# Patient Record
Sex: Male | Born: 1975 | Race: White | Hispanic: No | Marital: Single | Smoking: Current every day smoker
Health system: Southern US, Community
[De-identification: ages and names within clinical notes are randomized; demographics above are authoritative.]

## PROBLEM LIST (undated history)

## (undated) DIAGNOSIS — E119 Type 2 diabetes mellitus without complications: Secondary | ICD-10-CM

## (undated) DIAGNOSIS — K852 Alcohol induced acute pancreatitis without necrosis or infection: Secondary | ICD-10-CM

## (undated) DIAGNOSIS — E118 Type 2 diabetes mellitus with unspecified complications: Secondary | ICD-10-CM

## (undated) DIAGNOSIS — Z9081 Acquired absence of spleen: Secondary | ICD-10-CM

## (undated) HISTORY — PX: PANCREATECTOMY: SHX1019

## (undated) HISTORY — PX: SPLENECTOMY: SUR1306

---

## 2019-07-30 ENCOUNTER — Encounter: Payer: Self-pay | Admitting: *Deleted

## 2019-07-30 ENCOUNTER — Inpatient Hospital Stay
Admission: EM | Admit: 2019-07-30 | Discharge: 2019-08-01 | DRG: 639 | Disposition: A | Discharge status: Home / Self Care | Payer: Medicare Other | Attending: Internal Medicine | Admitting: Internal Medicine

## 2019-07-30 ENCOUNTER — Other Ambulatory Visit: Payer: Self-pay

## 2019-07-30 DIAGNOSIS — E1010 Type 1 diabetes mellitus with ketoacidosis without coma: Secondary | ICD-10-CM | POA: Diagnosis not present

## 2019-07-30 DIAGNOSIS — E1110 Type 2 diabetes mellitus with ketoacidosis without coma: Secondary | ICD-10-CM | POA: Diagnosis present

## 2019-07-30 DIAGNOSIS — Z20822 Contact with and (suspected) exposure to covid-19: Secondary | ICD-10-CM | POA: Diagnosis present

## 2019-07-30 DIAGNOSIS — R109 Unspecified abdominal pain: Secondary | ICD-10-CM

## 2019-07-30 DIAGNOSIS — I10 Essential (primary) hypertension: Secondary | ICD-10-CM | POA: Diagnosis present

## 2019-07-30 DIAGNOSIS — F17200 Nicotine dependence, unspecified, uncomplicated: Secondary | ICD-10-CM | POA: Diagnosis present

## 2019-07-30 LAB — GLUCOSE, CAPILLARY: Glucose-Capillary: 512 mg/dL (ref 70–99)

## 2019-07-30 LAB — CBC
HCT: 41.7 % (ref 39.0–52.0)
Hemoglobin: 13.9 g/dL (ref 13.0–17.0)
MCH: 31.6 pg (ref 26.0–34.0)
MCHC: 33.3 g/dL (ref 30.0–36.0)
MCV: 94.8 fL (ref 80.0–100.0)
Platelets: 254 10*3/uL (ref 150–400)
RBC: 4.4 MIL/uL (ref 4.22–5.81)
RDW: 14.3 % (ref 11.5–15.5)
WBC: 29.3 10*3/uL — ABNORMAL HIGH (ref 4.0–10.5)
nRBC: 0 % (ref 0.0–0.2)

## 2019-07-30 MED ORDER — SODIUM CHLORIDE 0.9% FLUSH: 3.0000 mL | Freq: Once | INTRAVENOUS | Status: DC

## 2019-07-30 NOTE — ED Triage Notes (Addendum)
Pt brought in via EMS from home with high blood sugar.  Pt also has n/v.  Pt reports abd pain  Pt alert   Iv in place.   Ems gave zofran 4mg  and iv NS.

## 2019-07-30 NOTE — ED Notes (Addendum)
fsbs 512 in triage.  Unable to void at this time.

## 2019-07-31 DIAGNOSIS — E1010 Type 1 diabetes mellitus with ketoacidosis without coma: Secondary | ICD-10-CM | POA: Diagnosis present

## 2019-07-31 DIAGNOSIS — Z20822 Contact with and (suspected) exposure to covid-19: Secondary | ICD-10-CM | POA: Diagnosis present

## 2019-07-31 DIAGNOSIS — R109 Unspecified abdominal pain: Secondary | ICD-10-CM

## 2019-07-31 DIAGNOSIS — E1110 Type 2 diabetes mellitus with ketoacidosis without coma: Secondary | ICD-10-CM | POA: Diagnosis present

## 2019-07-31 DIAGNOSIS — F17200 Nicotine dependence, unspecified, uncomplicated: Secondary | ICD-10-CM | POA: Diagnosis present

## 2019-07-31 DIAGNOSIS — E0810 Diabetes mellitus due to underlying condition with ketoacidosis without coma: Secondary | ICD-10-CM | POA: Diagnosis not present

## 2019-07-31 DIAGNOSIS — I10 Essential (primary) hypertension: Secondary | ICD-10-CM | POA: Diagnosis present

## 2019-07-31 LAB — RESPIRATORY PANEL BY RT PCR (FLU A&B, COVID)
Influenza A by PCR: NEGATIVE
Influenza B by PCR: NEGATIVE
SARS Coronavirus 2 by RT PCR: NEGATIVE

## 2019-07-31 LAB — GLUCOSE, CAPILLARY
Glucose-Capillary: 439 mg/dL — ABNORMAL HIGH (ref 70–99)
Glucose-Capillary: 390 mg/dL — ABNORMAL HIGH (ref 70–99)
Glucose-Capillary: 246 mg/dL — ABNORMAL HIGH (ref 70–99)
Glucose-Capillary: 189 mg/dL — ABNORMAL HIGH (ref 70–99)
Glucose-Capillary: 137 mg/dL — ABNORMAL HIGH (ref 70–99)
Glucose-Capillary: 122 mg/dL — ABNORMAL HIGH (ref 70–99)
Glucose-Capillary: 117 mg/dL — ABNORMAL HIGH (ref 70–99)
Glucose-Capillary: 120 mg/dL — ABNORMAL HIGH (ref 70–99)
Glucose-Capillary: 139 mg/dL — ABNORMAL HIGH (ref 70–99)
Glucose-Capillary: 177 mg/dL — ABNORMAL HIGH (ref 70–99)
Glucose-Capillary: 180 mg/dL — ABNORMAL HIGH (ref 70–99)
Glucose-Capillary: 193 mg/dL — ABNORMAL HIGH (ref 70–99)
Glucose-Capillary: 194 mg/dL — ABNORMAL HIGH (ref 70–99)
Glucose-Capillary: 139 mg/dL — ABNORMAL HIGH (ref 70–99)
Glucose-Capillary: 76 mg/dL (ref 70–99)

## 2019-07-31 LAB — BASIC METABOLIC PANEL
Anion gap: 19 — ABNORMAL HIGH (ref 5–15)
Anion gap: 6 (ref 5–15)
Anion gap: 5 (ref 5–15)
Anion gap: 5 (ref 5–15)
Anion gap: 9 (ref 5–15)
BUN: 14 mg/dL (ref 6–20)
BUN: 15 mg/dL (ref 6–20)
BUN: 15 mg/dL (ref 6–20)
BUN: 15 mg/dL (ref 6–20)
BUN: 14 mg/dL (ref 6–20)
CO2: 15 mmol/L — ABNORMAL LOW (ref 22–32)
CO2: 25 mmol/L (ref 22–32)
CO2: 25 mmol/L (ref 22–32)
CO2: 25 mmol/L (ref 22–32)
CO2: 23 mmol/L (ref 22–32)
Calcium: 8.7 mg/dL — ABNORMAL LOW (ref 8.9–10.3)
Calcium: 9.2 mg/dL (ref 8.9–10.3)
Calcium: 8.5 mg/dL — ABNORMAL LOW (ref 8.9–10.3)
Calcium: 8.2 mg/dL — ABNORMAL LOW (ref 8.9–10.3)
Calcium: 8.6 mg/dL — ABNORMAL LOW (ref 8.9–10.3)
Chloride: 105 mmol/L (ref 98–111)
Chloride: 110 mmol/L (ref 98–111)
Chloride: 108 mmol/L (ref 98–111)
Chloride: 105 mmol/L (ref 98–111)
Chloride: 104 mmol/L (ref 98–111)
Creatinine, Ser: 1.25 mg/dL — ABNORMAL HIGH (ref 0.61–1.24)
Creatinine, Ser: 1.11 mg/dL (ref 0.61–1.24)
Creatinine, Ser: 1.02 mg/dL (ref 0.61–1.24)
Creatinine, Ser: 1.02 mg/dL (ref 0.61–1.24)
Creatinine, Ser: 0.83 mg/dL (ref 0.61–1.24)
GFR calc Af Amer: >60 mL/min (ref >60)
GFR calc Af Amer: >60 mL/min (ref >60)
GFR calc Af Amer: >60 mL/min (ref >60)
GFR calc Af Amer: >60 mL/min (ref >60)
GFR calc Af Amer: >60 mL/min (ref >60)
GFR calc non Af Amer: >60 mL/min (ref >60)
GFR calc non Af Amer: >60 mL/min (ref >60)
GFR calc non Af Amer: >60 mL/min (ref >60)
GFR calc non Af Amer: >60 mL/min (ref >60)
GFR calc non Af Amer: >60 mL/min (ref >60)
Glucose, Bld: 469 mg/dL — ABNORMAL HIGH (ref 70–99)
Glucose, Bld: 148 mg/dL — ABNORMAL HIGH (ref 70–99)
Glucose, Bld: 165 mg/dL — ABNORMAL HIGH (ref 70–99)
Glucose, Bld: 186 mg/dL — ABNORMAL HIGH (ref 70–99)
Glucose, Bld: 97 mg/dL (ref 70–99)
Potassium: 4.8 mmol/L (ref 3.5–5.1)
Potassium: 3.8 mmol/L (ref 3.5–5.1)
Potassium: 4.2 mmol/L (ref 3.5–5.1)
Potassium: 4.2 mmol/L (ref 3.5–5.1)
Potassium: 4 mmol/L (ref 3.5–5.1)
Sodium: 139 mmol/L (ref 135–145)
Sodium: 141 mmol/L (ref 135–145)
Sodium: 138 mmol/L (ref 135–145)
Sodium: 135 mmol/L (ref 135–145)
Sodium: 136 mmol/L (ref 135–145)

## 2019-07-31 LAB — COMPREHENSIVE METABOLIC PANEL
ALT: 42 U/L (ref 0–44)
AST: 70 U/L — ABNORMAL HIGH (ref 15–41)
Albumin: 4 g/dL (ref 3.5–5.0)
Alkaline Phosphatase: 189 U/L — ABNORMAL HIGH (ref 38–126)
Anion gap: 21 — ABNORMAL HIGH (ref 5–15)
BUN: 12 mg/dL (ref 6–20)
CO2: 16 mmol/L — ABNORMAL LOW (ref 22–32)
Calcium: 9.3 mg/dL (ref 8.9–10.3)
Chloride: 98 mmol/L (ref 98–111)
Creatinine, Ser: 1.21 mg/dL (ref 0.61–1.24)
GFR calc Af Amer: >60 mL/min (ref >60)
GFR calc non Af Amer: >60 mL/min (ref >60)
Glucose, Bld: 526 mg/dL (ref 70–99)
Potassium: 4 mmol/L (ref 3.5–5.1)
Sodium: 135 mmol/L (ref 135–145)
Total Bilirubin: 1.2 mg/dL (ref 0.3–1.2)
Total Protein: 6.7 g/dL (ref 6.5–8.1)

## 2019-07-31 LAB — PROCALCITONIN: Procalcitonin: 1.74 ng/mL

## 2019-07-31 LAB — BLOOD GAS, VENOUS
Acid-base deficit: 8.3 mmol/L — ABNORMAL HIGH (ref 0.0–2.0)
Bicarbonate: 20.8 mmol/L (ref 20.0–28.0)
O2 Saturation: 26.8 %
Patient temperature: 37
pCO2, Ven: 57 mmHg (ref 44.0–60.0)
pH, Ven: 7.17 — CL (ref 7.250–7.430)
pO2, Ven: <31 mmHg — CL (ref 32.0–45.0)

## 2019-07-31 LAB — MAGNESIUM: Magnesium: 1.6 mg/dL — ABNORMAL LOW (ref 1.7–2.4)

## 2019-07-31 LAB — HIV ANTIBODY (ROUTINE TESTING W REFLEX): HIV Screen 4th Generation wRfx: NONREACTIVE

## 2019-07-31 LAB — MRSA PCR SCREENING: MRSA by PCR: NEGATIVE

## 2019-07-31 LAB — LIPASE, BLOOD: Lipase: 12 U/L (ref 11–51)

## 2019-07-31 LAB — CBC
HCT: 34.8 % — ABNORMAL LOW (ref 39.0–52.0)
Hemoglobin: 12.2 g/dL — ABNORMAL LOW (ref 13.0–17.0)
MCH: 31.7 pg (ref 26.0–34.0)
MCHC: 35.1 g/dL (ref 30.0–36.0)
MCV: 90.4 fL (ref 80.0–100.0)
Platelets: 233 10*3/uL (ref 150–400)
RBC: 3.85 MIL/uL — ABNORMAL LOW (ref 4.22–5.81)
RDW: 14.2 % (ref 11.5–15.5)
WBC: 30.5 10*3/uL — ABNORMAL HIGH (ref 4.0–10.5)
nRBC: 0 % (ref 0.0–0.2)

## 2019-07-31 LAB — BETA-HYDROXYBUTYRIC ACID: Beta-Hydroxybutyric Acid: 0.86 mmol/L — ABNORMAL HIGH (ref 0.05–0.27)

## 2019-07-31 LAB — HEMOGLOBIN A1C
Hgb A1c MFr Bld: 8.9 % — ABNORMAL HIGH (ref 4.8–5.6)
Mean Plasma Glucose: 208.73 mg/dL

## 2019-07-31 LAB — PHOSPHORUS: Phosphorus: 1.7 mg/dL — ABNORMAL LOW (ref 2.5–4.6)

## 2019-07-31 MED ORDER — INSULIN GLARGINE 100 UNIT/ML ~~LOC~~ SOLN
6.0000 [IU] | Freq: Every day | SUBCUTANEOUS | Status: DC
  Administered 2019-07-31: 6 [IU] via SUBCUTANEOUS
  Filled 2019-07-31 (×2): qty 0.06

## 2019-07-31 MED ORDER — INSULIN REGULAR(HUMAN) IN NACL 100-0.9 UT/100ML-% IV SOLN
INTRAVENOUS | Status: DC
  Administered 2019-07-31: 02:00:00 12 [IU]/h via INTRAVENOUS
  Filled 2019-07-31: qty 100

## 2019-07-31 MED ORDER — BUPRENORPHINE HCL-NALOXONE HCL 2-0.5 MG SL SUBL
2.0000 | SUBLINGUAL_TABLET | Freq: Four times a day (QID) | SUBLINGUAL | Status: DC
  Administered 2019-07-31 – 2019-08-01 (×5): 2 via SUBLINGUAL
  Filled 2019-07-31 (×6): qty 2

## 2019-07-31 MED ORDER — CHLORHEXIDINE GLUCONATE CLOTH 2 % EX PADS: 6.0000 | MEDICATED_PAD | Freq: Every day | CUTANEOUS | Status: DC

## 2019-07-31 MED ORDER — ENOXAPARIN SODIUM 40 MG/0.4ML ~~LOC~~ SOLN
40.0000 mg | SUBCUTANEOUS | Status: DC
  Filled 2019-07-31: qty 0.4

## 2019-07-31 MED ORDER — POTASSIUM PHOSPHATES 15 MMOLE/5ML IV SOLN
30.0000 mmol | Freq: Once | INTRAVENOUS | Status: AC
  Administered 2019-07-31: 30 mmol via INTRAVENOUS
  Filled 2019-07-31: qty 10

## 2019-07-31 MED ORDER — DEXTROSE 50 % IV SOLN
0.0000 mL | INTRAVENOUS | Status: DC | PRN
  Filled 2019-07-31: qty 50

## 2019-07-31 MED ORDER — POLYETHYLENE GLYCOL 3350 17 G PO PACK: 17.0000 g | PACK | Freq: Every day | ORAL | Status: DC | PRN

## 2019-07-31 MED ORDER — POTASSIUM CHLORIDE 10 MEQ/100ML IV SOLN
10.0000 meq | INTRAVENOUS | Status: AC
  Administered 2019-07-31: 10 meq via INTRAVENOUS
  Filled 2019-07-31: qty 100

## 2019-07-31 MED ORDER — ENOXAPARIN SODIUM 40 MG/0.4ML ~~LOC~~ SOLN
40.0000 mg | SUBCUTANEOUS | Status: DC
  Administered 2019-07-31: 40 mg via SUBCUTANEOUS
  Filled 2019-07-31: qty 0.4

## 2019-07-31 MED ORDER — DOCUSATE SODIUM 100 MG PO CAPS: 100.0000 mg | ORAL_CAPSULE | Freq: Two times a day (BID) | ORAL | Status: DC | PRN

## 2019-07-31 MED ORDER — SODIUM CHLORIDE 0.9 % IV BOLUS
1000.0000 mL | Freq: Once | INTRAVENOUS | Status: AC
  Administered 2019-07-31: 1000 mL via INTRAVENOUS

## 2019-07-31 MED ORDER — INSULIN ASPART 100 UNIT/ML ~~LOC~~ SOLN
0.0000 [IU] | SUBCUTANEOUS | Status: DC
  Administered 2019-07-31 – 2019-08-01 (×2): 1 [IU] via SUBCUTANEOUS
  Filled 2019-07-31 (×2): qty 1

## 2019-07-31 MED ORDER — INSULIN ASPART 100 UNIT/ML ~~LOC~~ SOLN: 0.0000 [IU] | SUBCUTANEOUS | Status: DC

## 2019-07-31 MED ORDER — BUPRENORPHINE HCL-NALOXONE HCL 8-2 MG SL SUBL
1.0000 | SUBLINGUAL_TABLET | Freq: Two times a day (BID) | SUBLINGUAL | Status: DC
  Administered 2019-07-31: 1 via SUBLINGUAL
  Filled 2019-07-31: qty 1

## 2019-07-31 MED ORDER — ONDANSETRON HCL 4 MG/2ML IJ SOLN: 4.0000 mg | Freq: Four times a day (QID) | INTRAMUSCULAR | Status: DC | PRN

## 2019-07-31 MED ORDER — INSULIN ASPART 100 UNIT/ML ~~LOC~~ SOLN: 3.0000 [IU] | Freq: Three times a day (TID) | SUBCUTANEOUS | Status: DC

## 2019-07-31 MED ORDER — PROMETHAZINE HCL 25 MG/ML IJ SOLN
6.2500 mg | Freq: Four times a day (QID) | INTRAMUSCULAR | Status: DC | PRN
  Administered 2019-07-31: 6.25 mg via INTRAVENOUS
  Filled 2019-07-31: qty 1

## 2019-07-31 MED ORDER — MORPHINE SULFATE (PF) 2 MG/ML IV SOLN: 1.0000 mg | INTRAVENOUS | Status: DC | PRN

## 2019-07-31 MED ORDER — DEXTROSE-NACL 5-0.45 % IV SOLN: INTRAVENOUS | Status: DC

## 2019-07-31 MED ORDER — SENNOSIDES-DOCUSATE SODIUM 8.6-50 MG PO TABS
1.0000 | ORAL_TABLET | Freq: Two times a day (BID) | ORAL | Status: DC
  Filled 2019-07-31: qty 1

## 2019-07-31 MED ORDER — ONDANSETRON HCL 4 MG/2ML IJ SOLN
4.0000 mg | Freq: Once | INTRAMUSCULAR | Status: AC
  Administered 2019-07-31: 4 mg via INTRAVENOUS
  Filled 2019-07-31: qty 2

## 2019-07-31 MED ORDER — SODIUM CHLORIDE 0.9 % IV SOLN: INTRAVENOUS | Status: DC

## 2019-07-31 NOTE — Progress Notes (Signed)
Inpatient Diabetes Program Recommendations  AACE/ADA: New Consensus Statement on Inpatient Glycemic Control   Target Ranges:  Prepandial:   less than 140 mg/dL      Peak postprandial:   less than 180 mg/dL (1-2 hours)      Critically ill patients:  140 - 180 mg/dL   Results for Ian Gross, Ian "CHRIS" (MRN [id number]) as of 07/31/2019 07:53  Ref. Range 07/31/2019 01:38 07/31/2019 02:48 07/31/2019 03:51 07/31/2019 04:55 07/31/2019 05:58 07/31/2019 06:59  Glucose-Capillary Latest Ref Range: 70 - 99 mg/dL 409 (H) 811 (H) 914 (H) 189 (H) 137 (H) 122 (H)  Results for Ian Gross, Ian "CHRIS" (MRN [id number]) as of 07/31/2019 07:53  Ref. Range 07/30/2019 22:40 07/31/2019 00:17  Beta-Hydroxybutyric Acid Latest Ref Range: 0.05 - 0.27 mmol/L  0.86 (H)  Glucose Latest Ref Range: 70 - 99 mg/dL 086 (HH)    Review of Glycemic Control  Diabetes history: Treated as DM1 (had pancreatectomy Feb 2021) Outpatient Diabetes medications: Lantus 9 units QHS, Humalog 5 units with small meals or 10 units with regular or large meals, Humalog 0-10 units TID with meals for correction Current orders for Inpatient glycemic control: IV insulin  Inpatient Diabetes Program Recommendations:   Insulin at Transition from IV to SQ: Once MD is ready to transition from IV to SQ insulin, please consider ordering Lantus 6 units Q24H (based on 59 kg x 0.1 units), Novolog 0-9 units Q4H, and Novolog 3 units TID with meals if patient eats at least 50% of meals.  NOTE: In reviewing chart, noted patient was dx with pancreatic insufficiency 10/2018 and had pancreatectomy Feb 2021. Patient is followed by [facility] Endocr[facility] and treated as having Type 1 DM. Patient seen Dr. Yetta Barre [facility] Endocrinology) on 07/04/19 and per office note in the chart, patient was asked to increase Lantus to 9 units QHS, increase meal coverage to Humalog 5 units with small meals or 10 units with regular or large meals, and correction insulin was increased (if 151-200 take 2 units;  201-250 take 4 units; 251-300 take 6 units; 301-350 take 8 units; if over 350 take 10 units). Also noted A1C was 7.6% on 07/04/19 indicating an average glucose of 171 mg/dl. Spoke with patient over the phone to inquire about insulin regimen and glucose control. Patient confirms that he is taking Lantus and Humalog as noted above. Patient states that he is also using the FreeStyle Libre glucose monitoring sensor. Since patient last seen [facility] Endocrinology and insulin was increased, he is experiencing frequent hypoglycemia (6-7 times per week) and frequently wakes up during the night with hypoglycemia. Patient reports that he is able to feel when he is experiencing hypoglycemia. Patient reports that on his prior insulin dosages (before the increase on 07/04/19) he was not experiencing hypoglycemia very often at all. Discussed DKA and hospital protocol with IV insulin and transition to SQ insulin. Discussed SQ insulin recommendations and explained that less Lantus and Novolog meal coverage would be requested than he is taking at home due to his report of frequent hypoglycemia. Patient agreeable with recommended doses of Lantus and Novolog; explained it would be up to the attending doctor to order if they are agreeable. Patient verbalized understanding of information discussed and he states that he has no questions at this time.  Thanks, Orlando Penner, RN, MSN, CDE Diabetes Coordinator Inpatient Diabetes Program [phone] (Team Pager from 8am to 5pm)

## 2019-07-31 NOTE — ED Provider Notes (Signed)
[facility] Emergency Department Provider Note  ____________________________________________   First MD Initiated Contact with Patient 07/31/19 0002     (approximate)  I have reviewed the triage vital signs and the nursing notes.   HISTORY  Chief Complaint Nausea and Emesis    HPI Ian Gross is a 44 y.o. male with history of type 2 diabetes presents to the emergency department secondary to elevated blood sugar at home patient states that his blood glucose was in the 300s which is not unusual for him.  Patient does admits to nausea and nonbloody emesis.  Patient denies any abdominal pain to me.  Patient denies any recent illness no diarrhea.  Patient denies any urinary symptoms.  Patient denies any cough or dyspnea.        No past medical history on file.  Patient Active Problem List   Diagnosis Date Noted  . DKA (diabetic ketoacidoses) ([place]) 07/31/2019     Prior to Admission medications   Not on File    Allergies Patient has no known allergies.  No family history on file.  Social History Social History   Tobacco Use  . Smoking status: Current Every Day Smoker  . Smokeless tobacco: Never Used  Substance Use Topics  . Alcohol use: Not Currently  . Drug use: Not Currently    Review of Systems Constitutional: No fever/chills Eyes: No visual changes. ENT: No sore throat. Cardiovascular: Denies chest pain. Respiratory: Denies shortness of breath. Gastrointestinal: No abdominal pain.  No nausea, no vomiting.  No diarrhea.  No constipation. Genitourinary: Negative for dysuria. Musculoskeletal: Negative for neck pain.  Negative for back pain. Integumentary: Negative for rash. Neurological: Negative for headaches, focal weakness or numbness. Endocrine:  Positive for hyperglycemia  ____________________________________________   PHYSICAL EXAM:  VITAL SIGNS: ED Triage Vitals [07/30/19 2238]  Enc Vitals Group     BP 112/64      Pulse Rate 91     Resp 20     Temp 97.6 F (36.4 C)     Temp Source Oral     SpO2 99 %     Weight 59 kg (130 lb)     Height 1.829 m (6')     Head Circumference      Peak Flow      Pain Score 0     Pain Loc      Pain Edu?      Excl. in [place]?     Constitutional: Alert and oriented.  Eyes: Conjunctivae are normal.  Mouth/Throat: Dry oral mucosa Neck: No stridor.  No meningeal signs.   Cardiovascular: Normal rate, regular rhythm. Good peripheral circulation. Grossly normal heart sounds. Respiratory: Normal respiratory effort.  No retractions. Gastrointestinal: Soft and nontender. No distention.  Musculoskeletal: No lower extremity tenderness nor edema. No gross deformities of extremities. Neurologic:  Normal speech and language. No gross focal neurologic deficits are appreciated.  Skin:  Skin is warm, dry and intact. Psychiatric: Mood and affect are normal. Speech and behavior are normal.  ____________________________________________   LABS (all labs ordered are listed, but only abnormal results are displayed)  Labs Reviewed  COMPREHENSIVE METABOLIC PANEL - Abnormal; Notable for the following components:      Result Value   CO2 16 (*)    Glucose, Bld 526 (*)    AST 70 (*)    Alkaline Phosphatase 189 (*)    Anion gap 21 (*)    All other components within normal limits  CBC - Abnormal; Notable  for the following components:   WBC 29.3 (*)    All other components within normal limits  GLUCOSE, CAPILLARY - Abnormal; Notable for the following components:   Glucose-Capillary 512 (*)    All other components within normal limits  BLOOD GAS, VENOUS - Abnormal; Notable for the following components:   pH, Ven 7.17 (*)    pO2, Ven <31.0 (*)    Acid-base deficit 8.3 (*)    All other components within normal limits  BETA-HYDROXYBUTYRIC ACID - Abnormal; Notable for the following components:   Beta-Hydroxybutyric Acid 0.86 (*)    All other components within normal limits  LIPASE,  BLOOD  URINALYSIS, COMPLETE (UACMP) WITH MICROSCOPIC  HIV ANTIBODY (ROUTINE TESTING W REFLEX)  CBC  CREATININE, SERUM  CBC  MAGNESIUM  PHOSPHORUS  BASIC METABOLIC PANEL  BASIC METABOLIC PANEL  BASIC METABOLIC PANEL  BASIC METABOLIC PANEL  BASIC METABOLIC PANEL  BASIC METABOLIC PANEL  CBG MONITORING, ED  POC SARS CORONAVIRUS 2 AG -  ED       PROCEDURES   .Critical Care Performed by: Darci Current, MD Authorized by: Darci Current, MD   Critical care provider statement:    Critical care time (minutes):  30   Critical care time was exclusive of:  Separately billable procedures and treating other patients   Critical care was necessary to treat or prevent imminent or life-threatening deterioration of the following conditions:  Endocrine crisis   Critical care was time spent personally by me on the following activities:  Development of treatment plan with patient or surrogate, discussions with consultants, evaluation of patient's response to treatment, examination of patient, obtaining history from patient or surrogate, ordering and performing treatments and interventions, ordering and review of laboratory studies, ordering and review of radiographic studies, pulse oximetry, re-evaluation of patient's condition and review of old charts     ____________________________________________   INITIAL IMPRESSION / MDM / ASSESSMENT AND PLAN / ED COURSE  As part of my medical decision making, I reviewed the following data within the electronic MEDICAL RECORD NUMBER   44 year old male presented with above-stated history review of system consistent with diabetic ketoacidosis which was confirmed on laboratory data.  Patient's glucose 526 with anion gap of 21.  Patient received 2 L IV normal saline and subsequently insulin infusion initiated.  Patient discussed with Annabelle Harman NP in the ICU who will admit the patient for further evaluation and  management. ____________________________________________  FINAL CLINICAL IMPRESSION(S) / ED DIAGNOSES  Final diagnoses:  Diabetic ketoacidosis without coma associated with type 2 diabetes mellitus (HCC)     MEDICATIONS GIVEN DURING THIS VISIT:  Medications  insulin regular, human (MYXREDLIN) 100 units/ 100 mL infusion (has no administration in time range)  0.9 %  sodium chloride infusion (has no administration in time range)  dextrose 5 %-0.45 % sodium chloride infusion (has no administration in time range)  dextrose 50 % solution 0-50 mL (has no administration in time range)  potassium chloride 10 mEq in 100 mL IVPB (has no administration in time range)  docusate sodium (COLACE) capsule 100 mg (has no administration in time range)  polyethylene glycol (MIRALAX / GLYCOLAX) packet 17 g (has no administration in time range)  enoxaparin (LOVENOX) injection 40 mg (has no administration in time range)  sodium chloride 0.9 % bolus 1,000 mL (1,000 mLs Intravenous New Bag/Given 07/31/19 0021)  sodium chloride 0.9 % bolus 1,000 mL (1,000 mLs Intravenous New Bag/Given 07/31/19 0021)  ondansetron (ZOFRAN) injection 4 mg (4 mg  Intravenous Given 07/31/19 0032)     ED Discharge Orders    None      *Please note:  Ian Gross was evaluated in Emergency Department on 07/31/2019 for the symptoms described in the history of present illness. He was evaluated in the context of the global COVID-19 pandemic, which necessitated consideration that the patient might be at risk for infection with the SARS-CoV-2 virus that causes COVID-19. Institutional protocols and algorithms that pertain to the evaluation of patients at risk for COVID-19 are in a state of rapid change based on information released by regulatory bodies including the CDC and federal and state organizations. These policies and algorithms were followed during the patient's care in the ED.  Some ED evaluations and interventions may be delayed as a  result of limited staffing during the pandemic.*  Note:  This document was prepared using Dragon voice recognition software and may include unintentional dictation errors.   Darci Current, MD 07/31/19 [phone]

## 2019-07-31 NOTE — Progress Notes (Signed)
Pharmacy Electrolyte Monitoring Consult:  Pharmacy consulted to assist in monitoring and replacing electrolytes in this 44 y.o. male admitted on 07/30/2019 with DKA.   Labs:  Sodium (mmol/L)  Date Value  07/31/2019 138   Potassium (mmol/L)  Date Value  07/31/2019 4.2   Magnesium (mg/dL)  Date Value  34/14/4360 1.6 (L)   Phosphorus (mg/dL)  Date Value  16/58/0063 1.7 (L)   Calcium (mg/dL)  Date Value  49/49/4473 8.5 (L)   Albumin (g/dL)  Date Value  95/84/4171 4.0    Assessment/Plan: Will order potassium phosphate 30 mmol IV x 1.   Will replace for goal potassium ~ 4 while on insulin infusion and all other electrolytes within normal limits.   Next BMP scheduled for 1400.   Pharmacy will continue to monitor an adjust per consult.   Christl Fessenden L 07/31/2019 11:35 AM

## 2019-07-31 NOTE — H&P (Signed)
Name: Ian Gross MRN: [id number] DOB: 1976/02/08    ADMISSION DATE:  07/30/2019 CONSULTATION DATE: 07/31/2019  REFERRING MD : Dr. 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  He presented to [facility] ER on 05/3 from home via EMS with c/o elevated blood sugars in the 300's, abdominal pain, and d/n/v.  He states he did not take his morning or afternoon insulin due to CBG readings in the 60's.  In the ER lab results ruled pt in for DKA, therefore pt received 2L NS bolus and insulin gtt initiated.  PCCM team contacted by ER physician for hospital admission.    PAST MEDICAL HISTORY :   has no past medical history on file.  has no past surgical history on file. Prior to Admission medications   Not on File   No Known Allergies  FAMILY HISTORY:  family history is not on file. SOCIAL HISTORY:  reports that he has been smoking. He has never used smokeless tobacco. He reports previous alcohol use. He reports previous drug use.  REVIEW OF SYSTEMS: Positives in BOLD  Constitutional: Negative for fever, chills, weight loss, malaise/fatigue and diaphoresis.  HENT: Negative for hearing loss, ear pain, nosebleeds, congestion, sore throat, neck pain, tinnitus and ear discharge.   Eyes: Negative for blurred vision, double vision, photophobia, pain, discharge and redness.  Respiratory: Negative for cough, hemoptysis, sputum production, shortness of breath, wheezing and stridor.   Cardiovascular: Negative for chest pain,  palpitations, orthopnea, claudication, leg swelling and PND.  Gastrointestinal: heartburn, nausea, vomiting, abdominal pain, diarrhea, constipation, blood in stool and melena.  Genitourinary: Negative for dysuria, urgency, frequency, hematuria and flank pain.  Musculoskeletal: Negative for myalgias, back pain, joint pain and falls.  Skin: Negative for itching and rash.  Neurological: Negative for dizziness, tingling, tremors, sensory change, speech change, focal weakness, seizures, loss of consciousness, weakness and headaches.  Endo/Heme/Allergies: Negative for environmental allergies and polydipsia. Does not bruise/bleed easily.  SUBJECTIVE:  c/o abdominal pain  VITAL SIGNS: Temp:  [97.6 F (36.4 C)] 97.6 F (36.4 C) (05/03 2238) Pulse Rate:  [91-119] 119 (05/04 0100) Resp:  [12-20] 13 (05/04 0100) BP: (91-120)/(49-74) 91/74 (05/04 0100) SpO2:  [97 %-100 %] 97 % (05/04 0100) Weight:  [59 kg] 59 kg (05/03 2238)  PHYSICAL EXAMINATION: General: well developed, well nourished male resting in bed, NAD  Neuro: alert and oriented, follows commands  HEENT: poor dentition, no JVD  Cardiovascular: sinus tachycardia, no R/G  Lungs: clear throughout, even, non labored  Abdomen: +BS x4, obese, non distended, tenderness present  Musculoskeletal: normal bulk and tone, no edema  Skin: intact no rashes or lesions present   Recent Labs  Lab 07/30/19 2240  NA 135  K 4.0  CL 98  CO2 16*  BUN 12  CREATININE 1.21  GLUCOSE 526*   Recent Labs  Lab 07/30/19 2240  HGB 13.9  HCT 41.7  WBC 29.3*  PLT 254   No results found.  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

## 2019-07-31 NOTE — Progress Notes (Signed)
VSS. Insulin drip off, transitioned to SSI and meal coverage insulin per diabetic coordinator and [facility]. No output this shift. Bladder scan showed 495 ml. Pt denies pain or discomfort. Dr. Belia Heman made aware. Order for in and out catheter. Pt declines in and out cath at this time and wishes to void in his own time. Will continue to monitor.

## 2019-08-01 LAB — CBC WITH DIFFERENTIAL/PLATELET
Abs Immature Granulocytes: 0.07 10*3/uL (ref 0.00–0.07)
Basophils Absolute: 0.1 10*3/uL (ref 0.0–0.1)
Basophils Relative: 1 %
Eosinophils Absolute: 0.1 10*3/uL (ref 0.0–0.5)
Eosinophils Relative: 1 %
HCT: 34.2 % — ABNORMAL LOW (ref 39.0–52.0)
Hemoglobin: 11.9 g/dL — ABNORMAL LOW (ref 13.0–17.0)
Immature Granulocytes: 0 %
Lymphocytes Relative: 24 %
Lymphs Abs: 3.9 10*3/uL (ref 0.7–4.0)
MCH: 31.4 pg (ref 26.0–34.0)
MCHC: 34.8 g/dL (ref 30.0–36.0)
MCV: 90.2 fL (ref 80.0–100.0)
Monocytes Absolute: 1.5 10*3/uL — ABNORMAL HIGH (ref 0.1–1.0)
Monocytes Relative: 9 %
Neutro Abs: 10.8 10*3/uL — ABNORMAL HIGH (ref 1.7–7.7)
Neutrophils Relative %: 65 %
Platelets: 219 10*3/uL (ref 150–400)
RBC: 3.79 MIL/uL — ABNORMAL LOW (ref 4.22–5.81)
RDW: 14.3 % (ref 11.5–15.5)
WBC: 16.4 10*3/uL — ABNORMAL HIGH (ref 4.0–10.5)
nRBC: 0 % (ref 0.0–0.2)

## 2019-08-01 LAB — BASIC METABOLIC PANEL
Anion gap: 4 — ABNORMAL LOW (ref 5–15)
BUN: 16 mg/dL (ref 6–20)
CO2: 27 mmol/L (ref 22–32)
Calcium: 8.6 mg/dL — ABNORMAL LOW (ref 8.9–10.3)
Chloride: 103 mmol/L (ref 98–111)
Creatinine, Ser: 0.76 mg/dL (ref 0.61–1.24)
GFR calc Af Amer: >60 mL/min (ref >60)
GFR calc non Af Amer: >60 mL/min (ref >60)
Glucose, Bld: 129 mg/dL — ABNORMAL HIGH (ref 70–99)
Potassium: 4.1 mmol/L (ref 3.5–5.1)
Sodium: 134 mmol/L — ABNORMAL LOW (ref 135–145)

## 2019-08-01 LAB — PROCALCITONIN: Procalcitonin: 1.22 ng/mL

## 2019-08-01 LAB — GLUCOSE, CAPILLARY
Glucose-Capillary: 103 mg/dL — ABNORMAL HIGH (ref 70–99)
Glucose-Capillary: 116 mg/dL — ABNORMAL HIGH (ref 70–99)
Glucose-Capillary: 122 mg/dL — ABNORMAL HIGH (ref 70–99)
Glucose-Capillary: 124 mg/dL — ABNORMAL HIGH (ref 70–99)

## 2019-08-01 LAB — MAGNESIUM: Magnesium: 1.5 mg/dL — ABNORMAL LOW (ref 1.7–2.4)

## 2019-08-01 LAB — PHOSPHORUS: Phosphorus: 2.8 mg/dL (ref 2.5–4.6)

## 2019-08-01 MED ORDER — MAGNESIUM SULFATE 4 GM/100ML IV SOLN
4.0000 g | Freq: Once | INTRAVENOUS | Status: AC
  Administered 2019-08-01: 11:00:00 4 g via INTRAVENOUS
  Filled 2019-08-01: qty 100

## 2019-08-01 MED ORDER — PIPERACILLIN-TAZOBACTAM 3.375 G IVPB
3.3750 g | Freq: Three times a day (TID) | INTRAVENOUS | Status: DC
  Filled 2019-08-01 (×3): qty 50

## 2019-08-01 MED ORDER — CIPROFLOXACIN HCL 500 MG PO TABS
500.0000 mg | ORAL_TABLET | Freq: Two times a day (BID) | ORAL | 0 refills | Status: AC
Start: 2019-08-01 — End: 2019-08-08

## 2019-08-01 MED ORDER — MAGNESIUM OXIDE 400 (241.3 MG) MG PO TABS
400.0000 mg | ORAL_TABLET | Freq: Once | ORAL | Status: AC
  Administered 2019-08-01: 400 mg via ORAL
  Filled 2019-08-01: qty 1

## 2019-08-01 MED ORDER — STERILE WATER FOR INJECTION IJ SOLN
INTRAMUSCULAR | Status: AC
  Filled 2019-08-01: qty 10

## 2019-08-01 MED ORDER — VECURONIUM BROMIDE 10 MG IV SOLR
INTRAVENOUS | Status: AC
  Filled 2019-08-01: qty 10

## 2019-08-01 MED ORDER — METRONIDAZOLE 500 MG PO TABS
500.0000 mg | ORAL_TABLET | Freq: Three times a day (TID) | ORAL | 0 refills | Status: AC
Start: 2019-08-01 — End: 2019-08-08

## 2019-08-01 NOTE — Progress Notes (Signed)
A&OX4. RN explained discharge instructions to pt and pt verbalized understanding. VSS. IVs removed. Wheeled to medical mall entrance to private family vehicle.

## 2019-08-01 NOTE — Consult Note (Signed)
Pharmacy Antibiotic Note  Ian Gross is a 44 y.o. male admitted on 07/30/2019 with intra-abdominal infection.  Pharmacy has been consulted for Zosyn dosing.  Plan: Zosyn 3.375g IV q8h (4 hour infusion).  Height: 6' (182.9 cm) Weight: 59 kg (130 lb) IBW/kg (Calculated) : 77.6  Temp (24hrs), Avg:98.6 F (37 C), Min:98.4 F (36.9 C), Max:98.9 F (37.2 C)  Recent Labs  Lab 07/30/19 2240 07/31/19 0300 07/31/19 0635 07/31/19 1041 07/31/19 1428 07/31/19 1850 08/01/19 0509  WBC 29.3*  --  30.5*  --   --   --  16.4*  CREATININE 1.21   < > 1.11 1.02 1.02 0.83 0.76   < > = values in this interval not displayed.    Estimated Creatinine Clearance: 96.3 mL/min (by C-G formula based on SCr of 0.76 mg/dL).    No Known Allergies  Antimicrobials this admission: Zosyn 5/5 >>   Microbiology results: 5/5 MRSA PCR: negative  Thank you for allowing pharmacy to be a part of this patient's care.  Ian Gross A Ian Gross 08/01/2019 9:26 AM

## 2019-08-01 NOTE — Discharge Summary (Signed)
Physician Discharge Summary  Ian Gross RWE:[id number] DOB: [birth year] DOA: 07/30/2019  PCP: [facility]  Admit date: 07/30/2019 Discharge date: 08/01/2019  Admitted From: Home Disposition: Home  Recommendations for Outpatient Follow-up:  1. 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  He presented to [facility] ER on 05/3 from home via EMS with c/o elevated blood sugars in the 300's, abdominal pain, and d/n/v.  He states he did not take his morning or afternoon insulin due to CBG readings in the 60's.  In the ER lab results ruled pt in for DKA, therefore pt received 2L NS bolus and insulin gtt initiated.  PCCM team contacted by ER physician for hospital admission.    5/5: Care assumed by [facility] service.  Patient tolerating p.o. diet.  Anion gap closed.  Stable on subcutaneous insulin regimen.  Unclear trigger for DKA.  Patient suspects it may have been an accuracy with his insulin sensor.  He will go back to his primary care physician within 3days to have it checked.  In the meantime he will proceed with fingersticks at home and doses insulin based on that.  As procalcitonin was elevated and there was some elevation in the white count will treat empirically with 1 week of ciprofloxacin and metronidazole for intra-abdominal infection is the patient is 12 weeks postoperative.  Again no other clinical indicators of infection.  Patient stable for discharge home at this time.  Will follow up with his established outpatient doctors post discharge.  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 intake No nausea vomiting Abdominal pain resolved Procalcitonin minimally elevated Elevated white blood cell without clear indication of infection Patient will resume previous home insulin regimen Defer to fingersticks in the setting of inconsistency between arm sensor Follow-up with PCP within 3 days 7 days empiric treatment with ciprofloxacin and Flagyl Clear return to ED instructions provided  Discharge Instructions  Discharge Instructions    Diet - low sodium heart healthy   Complete by: As directed    Increase activity slowly   Complete by: As directed      Allergies as of 08/01/2019   No Known Allergies     Medication List    TAKE these medications   ciprofloxacin 500 MG tablet Commonly known as: Cipro Take 1 tablet (500 mg total) by mouth 2 (two) times daily for 7 days.   metroNIDAZOLE 500 MG tablet Commonly known as: Flagyl Take 1 tablet (500 mg total) by mouth 3 (three) times daily for 7 days.       No Known Allergies  Consultations:  ICU   Procedures/Studies:  No results found. (Echo, Carotid, EGD, Colonoscopy, ERCP)    Subjective: Patient seen and examined No complaints, feels well Stable for discharge home  Discharge Exam: Vitals:   08/01/19 1100 08/01/19 1112  BP: (!) 144/70   Pulse: 68  Resp: (!) 22   Temp:  98.5 F (36.9 C)  SpO2: 99%    Vitals:   08/01/19 0900 08/01/19 1000 08/01/19 1100 08/01/19 1112  BP: 128/63 127/63 (!) 144/70   Pulse: 68 63 68   Resp: 19 16 (!) 22   Temp:    98.5 F (36.9 C)  TempSrc:    Oral   SpO2: 99% 99% 99%   Weight:      Height:        General: Pt is alert, awake, not in acute distress Cardiovascular: RRR, S1/S2 +, no rubs, no gallops Respiratory: CTA bilaterally, no wheezing, no rhonchi Abdominal: Soft, NT, ND, bowel sounds + Extremities: no edema, no cyanosis    The results of significant diagnostics from this hospitalization (including imaging, microbiology, ancillary and laboratory) are listed below for reference.     Microbiology: Recent Results (from the past 240 hour(s))  Respiratory Panel by RT PCR (Flu A&B, Covid) - Nasopharyngeal Swab     Status: None   Collection Time: 07/31/19  2:08 AM   Specimen: Nasopharyngeal Swab  Result Value Ref Range Status   SARS Coronavirus 2 by RT PCR NEGATIVE NEGATIVE Final    Comment: (NOTE) SARS-CoV-2 target nucleic acids are NOT DETECTED. The SARS-CoV-2 RNA is generally detectable in upper respiratoy specimens during the acute phase of infection. The lowest concentration of SARS-CoV-2 viral copies this assay can detect is 131 copies/mL. A negative result does not preclude SARS-Cov-2 infection and should not be used as the sole basis for treatment or other patient management decisions. A negative result may occur with  improper specimen collection/handling, submission of specimen other than nasopharyngeal swab, presence of viral mutation(s) within the areas targeted by this assay, and inadequate number of viral copies (<131 copies/mL). A negative result must be combined with clinical observations, patient history, and epidemiological information. The expected result is Negative. Fact Sheet for Patients:  PinkCheek.be Fact Sheet for Healthcare Providers:  GravelBags.it This test is not yet ap proved or cleared by the [place] FDA and  has been authorized for detection and/or diagnosis of SARS-CoV-2 by FDA under an Emergency Use Authorization (EUA). This  EUA will remain  in effect (meaning this test can be used) for the duration of the COVID-19 declaration under Section 564(b)(1) of the Act, 21 U.S.C. section 360bbb-3(b)(1), unless the authorization is terminated or revoked sooner.    Influenza A by PCR NEGATIVE NEGATIVE Final   Influenza B by PCR NEGATIVE NEGATIVE Final    Comment: (NOTE) The Xpert Xpress SARS-CoV-2/FLU/RSV assay is intended as an aid in  the diagnosis of influenza from Nasopharyngeal swab specimens and  should not be used as a sole basis for treatment. Nasal washings and  aspirates are unacceptable for Xpert Xpress SARS-CoV-2/FLU/RSV  testing. Fact Sheet for Patients: PinkCheek.be Fact Sheet for Healthcare Providers: GravelBags.it This test is not yet approved or cleared by the [place] FDA and  has been authorized for detection and/or diagnosis of SARS-CoV-2 by  FDA under an Emergency Use Authorization (EUA). This EUA will remain  in effect (meaning this test can be used) for the duration of the  Covid-19 declaration under Section 564(b)(1) of the Act, 21  U.S.C. section 360bbb-3(b)(1), unless the authorization is  terminated or revoked. Performed at [facility], [address] [place], [place] [address]   MRSA PCR Screening     Status: None   Collection Time: 07/31/19  2:53 AM   Specimen: Nasal Mucosa; Nasopharyngeal  Result Value Ref Range Status   MRSA by PCR NEGATIVE NEGATIVE Final    Comment:        The GeneXpert MRSA Assay (FDA approved for NASAL specimens only), is one component of a comprehensive MRSA colonization surveillance program. It is not intended to diagnose MRSA infection nor to guide or monitor treatment for MRSA infections. Performed at [facility], [address] [place], [place] [address]      Labs: BNP (last 3 results) No results for input(s): BNP in the last 8760 hours. Basic Metabolic  Panel: Recent Labs  Lab 07/31/19 0635 07/31/19 1041 07/31/19 1428 07/31/19 1850 08/01/19 0509  NA 141 138 135 136 134*  K 3.8 4.2 4.2 4.0 4.1  CL 110 108 105 104 103  CO2 25 25 25 23 27   GLUCOSE 148* 165* 186* 97 129*  BUN 15 15 15 14 16   CREATININE 1.11 1.02 1.02 0.83 0.76  CALCIUM 9.2 8.5* 8.2* 8.6* 8.6*  MG 1.6*  --   --   --  1.5*  PHOS 1.7*  --   --   --  2.8   Liver Function Tests: Recent Labs  Lab 07/30/19 2240  AST 70*  ALT 42  ALKPHOS 189*  BILITOT 1.2  PROT 6.7  ALBUMIN 4.0   Recent Labs  Lab 07/30/19 2240  LIPASE 12   No results for input(s): AMMONIA in the last 168 hours. CBC: Recent Labs  Lab 07/30/19 2240 07/31/19 0635 08/01/19 0509  WBC 29.3* 30.5* 16.4*  NEUTROABS  --   --  10.8*  HGB 13.9 12.2* 11.9*  HCT 41.7 34.8* 34.2*  MCV 94.8 90.4 90.2  PLT 254 233 219   Cardiac Enzymes: No results for input(s): CKTOTAL, CKMB, CKMBINDEX, TROPONINI in the last 168 hours. BNP: Invalid input(s): POCBNP CBG: Recent Labs  Lab 07/31/19 1929 08/01/19 0003 08/01/19 0350 08/01/19 0725 08/01/19 1122  GLUCAP 76 122* 116* 124* 103*   D-Dimer No results for input(s): DDIMER in the last 72 hours. Hgb A1c Recent Labs    07/31/19 1428  HGBA1C 8.9*   Lipid Profile No results for input(s): CHOL, HDL, LDLCALC, TRIG, CHOLHDL, LDLDIRECT in the last 72 hours. Thyroid function studies No results for input(s): TSH, T4TOTAL, T3FREE, THYROIDAB in the last 72 hours.  Invalid input(s): FREET3 Anemia work up No results for input(s): VITAMINB12, FOLATE, FERRITIN, TIBC, IRON, RETICCTPCT in the last 72 hours. Urinalysis No results found for: COLORURINE, APPEARANCEUR, LABSPEC, PHURINE, GLUCOSEU, HGBUR, BILIRUBINUR, KETONESUR, PROTEINUR, UROBILINOGEN, NITRITE, LEUKOCYTESUR Sepsis Labs Invalid input(s): PROCALCITONIN,  WBC,  LACTICIDVEN Microbiology Recent Results (from the past 240 hour(s))  Respiratory Panel by RT PCR (Flu A&B, Covid) - Nasopharyngeal Swab      Status: None   Collection Time: 07/31/19  2:08 AM   Specimen: Nasopharyngeal Swab  Result Value Ref Range Status   SARS Coronavirus 2 by RT PCR NEGATIVE NEGATIVE Final    Comment: (NOTE) SARS-CoV-2 target nucleic acids are NOT DETECTED. The SARS-CoV-2 RNA is generally detectable in upper respiratoy specimens during the acute phase of infection. The lowest concentration of SARS-CoV-2 viral copies this assay can detect is 131 copies/mL. A negative result does not preclude SARS-Cov-2 infection and should not be used as the sole basis for treatment or other patient management decisions. A negative result may occur with  improper specimen collection/handling, submission of specimen other than nasopharyngeal swab, presence of viral mutation(s) within the areas targeted by this assay, and inadequate number of viral copies (<131 copies/mL).  A negative result must be combined with clinical observations, patient history, and epidemiological information. The expected result is Negative. Fact Sheet for Patients:  https://www.moore.com/ Fact Sheet for Healthcare Providers:  https://www.young.biz/ This test is not yet ap proved or cleared by the [place] FDA and  has been authorized for detection and/or diagnosis of SARS-CoV-2 by FDA under an Emergency Use Authorization (EUA). This EUA will remain  in effect (meaning this test can be used) for the duration of the COVID-19 declaration under Section 564(b)(1) of the Act, 21 U.S.C. section 360bbb-3(b)(1), unless the authorization is terminated or revoked sooner.    Influenza A by PCR NEGATIVE NEGATIVE Final   Influenza B by PCR NEGATIVE NEGATIVE Final    Comment: (NOTE) The Xpert Xpress SARS-CoV-2/FLU/RSV assay is intended as an aid in  the diagnosis of influenza from Nasopharyngeal swab specimens and  should not be used as a sole basis for treatment. Nasal washings and  aspirates are unacceptable for  Xpert Xpress SARS-CoV-2/FLU/RSV  testing. Fact Sheet for Patients: https://www.moore.com/ Fact Sheet for Healthcare Providers: https://www.young.biz/ This test is not yet approved or cleared by the [place] FDA and  has been authorized for detection and/or diagnosis of SARS-CoV-2 by  FDA under an Emergency Use Authorization (EUA). This EUA will remain  in effect (meaning this test can be used) for the duration of the  Covid-19 declaration under Section 564(b)(1) of the Act, 21  U.S.C. section 360bbb-3(b)(1), unless the authorization is  terminated or revoked. Performed at [facility], [address] [place], [place] [address]   MRSA PCR Screening     Status: None   Collection Time: 07/31/19  2:53 AM   Specimen: Nasal Mucosa; Nasopharyngeal  Result Value Ref Range Status   MRSA by PCR NEGATIVE NEGATIVE Final    Comment:        The GeneXpert MRSA Assay (FDA approved for NASAL specimens only), is one component of a comprehensive MRSA colonization surveillance program. It is not intended to diagnose MRSA infection nor to guide or monitor treatment for MRSA infections. Performed at [facility], [address] [place], [place] [address]      Time coordinating discharge: Over 30 minutes  SIGNED:   Tresa Moore, MD  [facility] 08/01/2019, 4:03 PM Pager   If 7PM-7AM, please contact night-coverage

## 2021-03-17 ENCOUNTER — Inpatient Hospital Stay
Admission: EM | Admit: 2021-03-17 | Discharge: 2021-03-20 | DRG: 638 | Disposition: A | Discharge status: Home / Self Care | Payer: Medicare Other | Attending: Internal Medicine | Admitting: Internal Medicine

## 2021-03-17 ENCOUNTER — Emergency Department: Payer: Medicare Other

## 2021-03-17 ENCOUNTER — Inpatient Hospital Stay: Payer: Medicare Other

## 2021-03-17 DIAGNOSIS — F17200 Nicotine dependence, unspecified, uncomplicated: Secondary | ICD-10-CM | POA: Diagnosis present

## 2021-03-17 DIAGNOSIS — E860 Dehydration: Secondary | ICD-10-CM | POA: Diagnosis present

## 2021-03-17 DIAGNOSIS — R6510 Systemic inflammatory response syndrome (SIRS) of non-infectious origin without acute organ dysfunction: Secondary | ICD-10-CM | POA: Diagnosis present

## 2021-03-17 DIAGNOSIS — Z79891 Long term (current) use of opiate analgesic: Secondary | ICD-10-CM

## 2021-03-17 DIAGNOSIS — E1010 Type 1 diabetes mellitus with ketoacidosis without coma: Secondary | ICD-10-CM | POA: Diagnosis not present

## 2021-03-17 DIAGNOSIS — Z794 Long term (current) use of insulin: Secondary | ICD-10-CM

## 2021-03-17 DIAGNOSIS — G8929 Other chronic pain: Secondary | ICD-10-CM

## 2021-03-17 DIAGNOSIS — D72829 Elevated white blood cell count, unspecified: Secondary | ICD-10-CM | POA: Diagnosis present

## 2021-03-17 DIAGNOSIS — Z79899 Other long term (current) drug therapy: Secondary | ICD-10-CM | POA: Diagnosis not present

## 2021-03-17 DIAGNOSIS — K2970 Gastritis, unspecified, without bleeding: Secondary | ICD-10-CM | POA: Diagnosis present

## 2021-03-17 DIAGNOSIS — N179 Acute kidney failure, unspecified: Secondary | ICD-10-CM | POA: Diagnosis present

## 2021-03-17 DIAGNOSIS — R112 Nausea with vomiting, unspecified: Secondary | ICD-10-CM | POA: Diagnosis not present

## 2021-03-17 DIAGNOSIS — I10 Essential (primary) hypertension: Secondary | ICD-10-CM | POA: Diagnosis present

## 2021-03-17 DIAGNOSIS — E1110 Type 2 diabetes mellitus with ketoacidosis without coma: Secondary | ICD-10-CM | POA: Diagnosis present

## 2021-03-17 DIAGNOSIS — R1110 Vomiting, unspecified: Secondary | ICD-10-CM

## 2021-03-17 DIAGNOSIS — Z9081 Acquired absence of spleen: Secondary | ICD-10-CM | POA: Diagnosis not present

## 2021-03-17 DIAGNOSIS — E0810 Diabetes mellitus due to underlying condition with ketoacidosis without coma: Secondary | ICD-10-CM

## 2021-03-17 DIAGNOSIS — Z9049 Acquired absence of other specified parts of digestive tract: Secondary | ICD-10-CM

## 2021-03-17 DIAGNOSIS — Z90410 Acquired total absence of pancreas: Secondary | ICD-10-CM | POA: Diagnosis not present

## 2021-03-17 DIAGNOSIS — E872 Acidosis, unspecified: Secondary | ICD-10-CM | POA: Diagnosis present

## 2021-03-17 DIAGNOSIS — Z20822 Contact with and (suspected) exposure to covid-19: Secondary | ICD-10-CM | POA: Diagnosis present

## 2021-03-17 DIAGNOSIS — Z980 Intestinal bypass and anastomosis status: Secondary | ICD-10-CM

## 2021-03-17 DIAGNOSIS — R531 Weakness: Secondary | ICD-10-CM

## 2021-03-17 HISTORY — DX: Type 2 diabetes mellitus without complications: E11.9

## 2021-03-17 LAB — URINALYSIS, ROUTINE W REFLEX MICROSCOPIC
Bacteria, UA: NONE SEEN
Bilirubin Urine: NEGATIVE
Glucose, UA: >=500 mg/dL — AB
Hgb urine dipstick: NEGATIVE
Ketones, ur: 5 mg/dL — AB
Leukocytes,Ua: NEGATIVE
Nitrite: NEGATIVE
Protein, ur: NEGATIVE mg/dL
Specific Gravity, Urine: 1.023 (ref 1.005–1.030)
Squamous Epithelial / HPF: NONE SEEN (ref 0–5)
pH: 5 (ref 5.0–8.0)

## 2021-03-17 LAB — CBC WITH DIFFERENTIAL/PLATELET
Abs Immature Granulocytes: 0.69 10*3/uL — ABNORMAL HIGH (ref 0.00–0.07)
Basophils Absolute: 0.2 10*3/uL — ABNORMAL HIGH (ref 0.0–0.1)
Basophils Relative: 1 %
Eosinophils Absolute: 0.1 10*3/uL (ref 0.0–0.5)
Eosinophils Relative: 1 %
HCT: 43.7 % (ref 39.0–52.0)
Hemoglobin: 14.5 g/dL (ref 13.0–17.0)
Immature Granulocytes: 5 %
Lymphocytes Relative: 27 %
Lymphs Abs: 3.9 10*3/uL (ref 0.7–4.0)
MCH: 33.3 pg (ref 26.0–34.0)
MCHC: 33.2 g/dL (ref 30.0–36.0)
MCV: 100.2 fL — ABNORMAL HIGH (ref 80.0–100.0)
Monocytes Absolute: 0.9 10*3/uL (ref 0.1–1.0)
Monocytes Relative: 6 %
Neutro Abs: 8.5 10*3/uL — ABNORMAL HIGH (ref 1.7–7.7)
Neutrophils Relative %: 60 %
Platelets: 339 10*3/uL (ref 150–400)
RBC: 4.36 MIL/uL (ref 4.22–5.81)
RDW: 14.2 % (ref 11.5–15.5)
WBC: 14.2 10*3/uL — ABNORMAL HIGH (ref 4.0–10.5)
nRBC: 0 % (ref 0.0–0.2)

## 2021-03-17 LAB — BASIC METABOLIC PANEL
Anion gap: 10 (ref 5–15)
Anion gap: 6 (ref 5–15)
BUN: 19 mg/dL (ref 6–20)
BUN: 19 mg/dL (ref 6–20)
CO2: 20 mmol/L — ABNORMAL LOW (ref 22–32)
CO2: 25 mmol/L (ref 22–32)
Calcium: 8 mg/dL — ABNORMAL LOW (ref 8.9–10.3)
Calcium: 8 mg/dL — ABNORMAL LOW (ref 8.9–10.3)
Chloride: 106 mmol/L (ref 98–111)
Chloride: 107 mmol/L (ref 98–111)
Creatinine, Ser: 1.25 mg/dL — ABNORMAL HIGH (ref 0.61–1.24)
Creatinine, Ser: 1.01 mg/dL (ref 0.61–1.24)
GFR, Estimated: >60 mL/min (ref >60)
GFR, Estimated: >60 mL/min (ref >60)
Glucose, Bld: 360 mg/dL — ABNORMAL HIGH (ref 70–99)
Glucose, Bld: 160 mg/dL — ABNORMAL HIGH (ref 70–99)
Potassium: 4.3 mmol/L (ref 3.5–5.1)
Potassium: 4.1 mmol/L (ref 3.5–5.1)
Sodium: 136 mmol/L (ref 135–145)
Sodium: 138 mmol/L (ref 135–145)

## 2021-03-17 LAB — HIV ANTIBODY (ROUTINE TESTING W REFLEX): HIV Screen 4th Generation wRfx: NONREACTIVE

## 2021-03-17 LAB — CBG MONITORING, ED
Glucose-Capillary: 379 mg/dL — ABNORMAL HIGH (ref 70–99)
Glucose-Capillary: 396 mg/dL — ABNORMAL HIGH (ref 70–99)
Glucose-Capillary: 319 mg/dL — ABNORMAL HIGH (ref 70–99)
Glucose-Capillary: 206 mg/dL — ABNORMAL HIGH (ref 70–99)
Glucose-Capillary: 134 mg/dL — ABNORMAL HIGH (ref 70–99)

## 2021-03-17 LAB — COMPREHENSIVE METABOLIC PANEL
ALT: 30 U/L (ref 0–44)
AST: 61 U/L — ABNORMAL HIGH (ref 15–41)
Albumin: 3.7 g/dL (ref 3.5–5.0)
Alkaline Phosphatase: 177 U/L — ABNORMAL HIGH (ref 38–126)
Anion gap: 19 — ABNORMAL HIGH (ref 5–15)
BUN: 18 mg/dL (ref 6–20)
CO2: 17 mmol/L — ABNORMAL LOW (ref 22–32)
Calcium: 9.2 mg/dL (ref 8.9–10.3)
Chloride: 100 mmol/L (ref 98–111)
Creatinine, Ser: 1.27 mg/dL — ABNORMAL HIGH (ref 0.61–1.24)
GFR, Estimated: >60 mL/min (ref >60)
Glucose, Bld: 377 mg/dL — ABNORMAL HIGH (ref 70–99)
Potassium: 4.5 mmol/L (ref 3.5–5.1)
Sodium: 136 mmol/L (ref 135–145)
Total Bilirubin: 1 mg/dL (ref 0.3–1.2)
Total Protein: 6.6 g/dL (ref 6.5–8.1)

## 2021-03-17 LAB — BLOOD GAS, VENOUS
Acid-base deficit: 7 mmol/L — ABNORMAL HIGH (ref 0.0–2.0)
Bicarbonate: 20.2 mmol/L (ref 20.0–28.0)
O2 Saturation: 74.7 %
Patient temperature: 37
pCO2, Ven: 46 mmHg (ref 44.0–60.0)
pH, Ven: 7.25 (ref 7.250–7.430)
pO2, Ven: 47 mmHg — ABNORMAL HIGH (ref 32.0–45.0)

## 2021-03-17 LAB — LACTIC ACID, PLASMA
Lactic Acid, Venous: 6.6 mmol/L (ref 0.5–1.9)
Lactic Acid, Venous: 5.6 mmol/L (ref 0.5–1.9)
Lactic Acid, Venous: 6.1 mmol/L (ref 0.5–1.9)
Lactic Acid, Venous: 2.4 mmol/L (ref 0.5–1.9)

## 2021-03-17 LAB — PROTIME-INR
INR: 1.1 (ref 0.8–1.2)
Prothrombin Time: 14.1 seconds (ref 11.4–15.2)

## 2021-03-17 LAB — BETA-HYDROXYBUTYRIC ACID
Beta-Hydroxybutyric Acid: 0.73 mmol/L — ABNORMAL HIGH (ref 0.05–0.27)
Beta-Hydroxybutyric Acid: 0.27 mmol/L (ref 0.05–0.27)

## 2021-03-17 LAB — RESP PANEL BY RT-PCR (FLU A&B, COVID) ARPGX2
Influenza A by PCR: NEGATIVE
Influenza B by PCR: NEGATIVE
SARS Coronavirus 2 by RT PCR: NEGATIVE

## 2021-03-17 LAB — TROPONIN I (HIGH SENSITIVITY)
Troponin I (High Sensitivity): 7 ng/L (ref <18)
Troponin I (High Sensitivity): 8 ng/L (ref <18)

## 2021-03-17 LAB — GLUCOSE, CAPILLARY
Glucose-Capillary: 135 mg/dL — ABNORMAL HIGH (ref 70–99)
Glucose-Capillary: 151 mg/dL — ABNORMAL HIGH (ref 70–99)
Glucose-Capillary: 144 mg/dL — ABNORMAL HIGH (ref 70–99)

## 2021-03-17 LAB — APTT: aPTT: 28 seconds (ref 24–36)

## 2021-03-17 LAB — PROCALCITONIN: Procalcitonin: <0.1 ng/mL

## 2021-03-17 LAB — MRSA NEXT GEN BY PCR, NASAL: MRSA by PCR Next Gen: NOT DETECTED

## 2021-03-17 MED ORDER — PANTOPRAZOLE SODIUM 40 MG IV SOLR
40.0000 mg | Freq: Two times a day (BID) | INTRAVENOUS | Status: DC
  Administered 2021-03-17 – 2021-03-20 (×6): 40 mg via INTRAVENOUS
  Filled 2021-03-17 (×6): qty 40

## 2021-03-17 MED ORDER — IOHEXOL 300 MG/ML  SOLN
100.0000 mL | Freq: Once | INTRAMUSCULAR | Status: AC | PRN
  Administered 2021-03-17: 16:00:00 100 mL via INTRAVENOUS

## 2021-03-17 MED ORDER — SODIUM CHLORIDE 0.9 % IV SOLN
2.0000 g | Freq: Three times a day (TID) | INTRAVENOUS | Status: DC
  Administered 2021-03-17 – 2021-03-20 (×8): 2 g via INTRAVENOUS
  Filled 2021-03-17 (×14): qty 2

## 2021-03-17 MED ORDER — LACTATED RINGERS IV BOLUS
500.0000 mL | Freq: Once | INTRAVENOUS | Status: AC
  Administered 2021-03-17: 22:00:00 500 mL via INTRAVENOUS

## 2021-03-17 MED ORDER — ONDANSETRON HCL 4 MG/2ML IJ SOLN
4.0000 mg | Freq: Four times a day (QID) | INTRAMUSCULAR | Status: DC | PRN
  Administered 2021-03-19: 12:00:00 4 mg via INTRAVENOUS
  Filled 2021-03-17: qty 2

## 2021-03-17 MED ORDER — NICOTINE 14 MG/24HR TD PT24
14.0000 mg | MEDICATED_PATCH | Freq: Every day | TRANSDERMAL | Status: DC
  Administered 2021-03-18 – 2021-03-20 (×3): 14 mg via TRANSDERMAL
  Filled 2021-03-17 (×3): qty 1

## 2021-03-17 MED ORDER — ACETAMINOPHEN 325 MG PO TABS: 650.0000 mg | ORAL_TABLET | Freq: Four times a day (QID) | ORAL | Status: DC | PRN

## 2021-03-17 MED ORDER — ONDANSETRON HCL 4 MG/2ML IJ SOLN
4.0000 mg | Freq: Once | INTRAMUSCULAR | Status: AC
  Administered 2021-03-17: 17:00:00 4 mg via INTRAVENOUS
  Filled 2021-03-17: qty 2

## 2021-03-17 MED ORDER — PANCRELIPASE (LIP-PROT-AMYL) 36000-114000 UNITS PO CPEP
72000.0000 [IU] | ORAL_CAPSULE | Freq: Three times a day (TID) | ORAL | Status: DC
  Administered 2021-03-18 – 2021-03-20 (×7): 72000 [IU] via ORAL
  Filled 2021-03-17 (×4): qty 2
  Filled 2021-03-17: qty 6
  Filled 2021-03-17: qty 2
  Filled 2021-03-17 (×2): qty 6

## 2021-03-17 MED ORDER — ACETAMINOPHEN 650 MG RE SUPP
650.0000 mg | Freq: Four times a day (QID) | RECTAL | Status: DC | PRN
  Filled 2021-03-17: qty 1

## 2021-03-17 MED ORDER — ENOXAPARIN SODIUM 40 MG/0.4ML IJ SOSY
40.0000 mg | PREFILLED_SYRINGE | INTRAMUSCULAR | Status: DC
  Administered 2021-03-17 – 2021-03-19 (×3): 40 mg via SUBCUTANEOUS
  Filled 2021-03-17 (×3): qty 0.4

## 2021-03-17 MED ORDER — INSULIN REGULAR(HUMAN) IN NACL 100-0.9 UT/100ML-% IV SOLN
INTRAVENOUS | Status: DC
  Administered 2021-03-17: 17:00:00 12 [IU]/h via INTRAVENOUS
  Filled 2021-03-17: qty 100

## 2021-03-17 MED ORDER — SODIUM CHLORIDE 0.9 % IV SOLN: 8.0000 mg | Freq: Once | INTRAVENOUS | Status: DC

## 2021-03-17 MED ORDER — DEXTROSE IN LACTATED RINGERS 5 % IV SOLN: INTRAVENOUS | Status: DC

## 2021-03-17 MED ORDER — SODIUM CHLORIDE 0.9 % IV SOLN
2.0000 g | Freq: Once | INTRAVENOUS | Status: AC
  Administered 2021-03-17: 16:00:00 2 g via INTRAVENOUS
  Filled 2021-03-17: qty 2

## 2021-03-17 MED ORDER — DEXTROSE 50 % IV SOLN: 0.0000 mL | INTRAVENOUS | Status: DC | PRN

## 2021-03-17 MED ORDER — ONDANSETRON HCL 4 MG PO TABS: 4.0000 mg | ORAL_TABLET | Freq: Four times a day (QID) | ORAL | Status: DC | PRN

## 2021-03-17 MED ORDER — DEXTROSE 5 % IV SOLN: INTRAVENOUS | Status: DC

## 2021-03-17 MED ORDER — SODIUM CHLORIDE 0.9 % IV BOLUS
1000.0000 mL | Freq: Once | INTRAVENOUS | Status: AC
  Administered 2021-03-17: 13:00:00 1000 mL via INTRAVENOUS

## 2021-03-17 MED ORDER — INSULIN REGULAR(HUMAN) IN NACL 100-0.9 UT/100ML-% IV SOLN: INTRAVENOUS | Status: DC

## 2021-03-17 MED ORDER — METRONIDAZOLE 500 MG/100ML IV SOLN
500.0000 mg | Freq: Two times a day (BID) | INTRAVENOUS | Status: DC
  Administered 2021-03-17 – 2021-03-20 (×6): 500 mg via INTRAVENOUS
  Filled 2021-03-17 (×9): qty 100

## 2021-03-17 MED ORDER — POLYETHYLENE GLYCOL 3350 17 G PO PACK: 17.0000 g | PACK | Freq: Every day | ORAL | Status: DC | PRN

## 2021-03-17 MED ORDER — INSULIN ASPART 100 UNIT/ML IJ SOLN
0.0000 [IU] | INTRAMUSCULAR | Status: DC
Start: 2021-03-18 — End: 2021-03-19
  Administered 2021-03-18 (×2): 1 [IU] via SUBCUTANEOUS
  Administered 2021-03-18: 04:00:00 2 [IU] via SUBCUTANEOUS
  Administered 2021-03-18: 20:00:00 5 [IU] via SUBCUTANEOUS
  Administered 2021-03-19 (×2): 3 [IU] via SUBCUTANEOUS
  Filled 2021-03-17 (×6): qty 1

## 2021-03-17 MED ORDER — METRONIDAZOLE 500 MG/100ML IV SOLN: 500.0000 mg | Freq: Two times a day (BID) | INTRAVENOUS | Status: DC

## 2021-03-17 MED ORDER — SODIUM CHLORIDE 0.9 % IV BOLUS
1000.0000 mL | Freq: Once | INTRAVENOUS | Status: AC
  Administered 2021-03-17: 16:00:00 1000 mL via INTRAVENOUS

## 2021-03-17 MED ORDER — CHLORHEXIDINE GLUCONATE CLOTH 2 % EX PADS
6.0000 | MEDICATED_PAD | Freq: Every day | CUTANEOUS | Status: DC
  Administered 2021-03-17 – 2021-03-18 (×2): 6 via TOPICAL

## 2021-03-17 MED ORDER — POTASSIUM CHLORIDE 10 MEQ/100ML IV SOLN
10.0000 meq | INTRAVENOUS | Status: AC
  Administered 2021-03-17: 19:00:00 10 meq via INTRAVENOUS
  Filled 2021-03-17: qty 100

## 2021-03-17 MED ORDER — INSULIN GLARGINE-YFGN 100 UNIT/ML ~~LOC~~ SOLN
10.0000 [IU] | Freq: Every day | SUBCUTANEOUS | Status: DC
  Administered 2021-03-18 (×2): 10 [IU] via SUBCUTANEOUS
  Filled 2021-03-17 (×4): qty 0.1

## 2021-03-17 MED ORDER — LACTATED RINGERS IV SOLN: INTRAVENOUS | Status: DC

## 2021-03-17 MED ORDER — BUPRENORPHINE HCL-NALOXONE HCL 8-2 MG SL SUBL
1.0000 | SUBLINGUAL_TABLET | Freq: Two times a day (BID) | SUBLINGUAL | Status: DC
  Administered 2021-03-17 – 2021-03-20 (×6): 1 via SUBLINGUAL
  Filled 2021-03-17 (×6): qty 1

## 2021-03-17 NOTE — H&P (Signed)
History and Physical    Ian Gross GBT:[id number] DOB: 1975/10/11 DOA: 03/17/2021  PCP: [facility]  Patient coming from: home  I have personally briefly reviewed patient's old medical records in [facility] Link  Chief Complaint: generalized malaise  HPI: Ian Gross is Ian Gross 45 y.o. 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  12/19 around 3 PM he started feeling poorly.  Developed N/V.  Felt bad all evening.  Just laid in the bathtub for like 4 hrs, barely got any sleep.  Around 6 am, took Ian Gross bath, threw up again, and laid in the bathtub for about 4 more hours.  He came to the ED due to continued discomfort and weakness.  7-8 episodes of bilious emesis.  No pain, maybe fevers.  +chills, + cold sweats, + cough (related to dry heaving), + sore throat (related to emesis).  No HA.  No CP or SOB.  No change in stools.    ED Course: Admit for DKA and lactic acidosis.  Requested CT scan.  Admit to hospitalist.  Review of Systems: As per HPI otherwise all other systems reviewed and are negative.  Past Medical History:  Diagnosis Date   Diabetes mellitus without complication (HCC)    Social History  reports that he has been smoking. He has never used smokeless tobacco. He reports that he does not currently use alcohol. He reports that he does not currently use drugs.  No Known Allergies  No family history on file.   Prior to Admission medications   Medication Sig Start Date End Date Taking? Authorizing Provider  Buprenorphine HCl-Naloxone HCl 8-2 MG FILM Place 1 strip under the tongue 2 (two) times daily. 11/15/20  Yes [provider]  HUMALOG KWIKPEN 100 UNIT/ML KwikPen Inject 4-9 Units into the skin 3 (three) times daily. Sliding  scale (go up by 1 unit) 12/28/20  Yes [provider]  insulin glargine (LANTUS) 100 UNIT/ML injection Inject 10 Units into the skin at bedtime.   Yes [provider]  Pancrelipase, Lip-Prot-Amyl, (CREON) 24000-76000 units CPEP Take 3 capsules by mouth 3 (three) times daily.   Yes [provider]    Physical Exam: Vitals:   03/17/21 1800 03/17/21 1908 03/17/21 2030 03/17/21 2045  BP:  106/61 117/74 120/62  Pulse:  (!) 107 95 98  Resp:  14 13 15   Temp:   98.6 F (37 C) 99.1 F (37.3 C)  TempSrc:   Oral Oral  SpO2:  97% 97% 100%  Weight:    56.2 kg  Height: 6' (1.829 m)   6' (1.829 m)    Constitutional: NAD, calm, comfortable Vitals:   03/17/21 1800 03/17/21 1908 03/17/21 2030 03/17/21 2045  BP:  106/61 117/74 120/62  Pulse:  (!) 107 95 98  Resp:  14 13 15   Temp:   98.6 F (37 C) 99.1 F (37.3 C)  TempSrc:   Oral Oral  SpO2:  97% 97% 100%  Weight:    56.2 kg  Height: 6' (1.829 m)   6' (1.829 m)   Eyes: PERRL, lids and conjunctivae normal ENMT: Mucous membranes are moist. Posterior pharynx clear of any exudate or lesions.Normal dentition.  Neck: normal, supple, no masses, no thyromegaly Respiratory: clear to auscultation bilaterally, no wheezing, no crackles. Normal respiratory effort. No accessory muscle use.  Cardiovascular: Regular rate and rhythm, no murmurs /  rubs / gallops. No extremity edema. 2+ pedal pulses. No carotid bruits.  Abdomen: no tenderness, no masses palpated. Prior surgical scars. Musculoskeletal: no clubbing / cyanosis. No joint deformity upper and lower extremities. Good ROM, no contractures. Normal muscle tone.  Skin: no rashes, lesions, ulcers. No induration Neurologic: CN 2-12 grossly intact. Sensation intact, DTR normal. Strength 5/5 in all 4.  Psychiatric: Normal judgment and insight. Alert and oriented x 3. Normal mood.   Labs on Admission: I have personally reviewed following labs and imaging studies  CBC: Recent Labs   Lab 03/17/21 1243  WBC 14.2*  NEUTROABS 8.5*  HGB 14.5  HCT 43.7  MCV 100.2*  PLT 339    Basic Metabolic Panel: Recent Labs  Lab 03/17/21 1243 03/17/21 1736  NA 136 136  K 4.5 4.3  CL 100 106  CO2 17* 20*  GLUCOSE 377* 360*  BUN 18 19  CREATININE 1.27* 1.25*  CALCIUM 9.2 8.0*    GFR: Estimated Creatinine Clearance: 58.1 mL/min (Ian Gross) (by C-G formula based on SCr of 1.25 mg/dL (H)).  Liver Function Tests: Recent Labs  Lab 03/17/21 1243  AST 61*  ALT 30  ALKPHOS 177*  BILITOT 1.0  PROT 6.6  ALBUMIN 3.7    Urine analysis:    Component Value Date/Time   COLORURINE YELLOW (Ian Gross) 03/17/2021 1500   APPEARANCEUR CLEAR (Ian Gross) 03/17/2021 1500   LABSPEC 1.023 03/17/2021 1500   PHURINE 5.0 03/17/2021 1500   GLUCOSEU >=500 (Ian Gross) 03/17/2021 1500   HGBUR NEGATIVE 03/17/2021 1500   BILIRUBINUR NEGATIVE 03/17/2021 1500   KETONESUR 5 (Ian Gross) 03/17/2021 1500   PROTEINUR NEGATIVE 03/17/2021 1500   NITRITE NEGATIVE 03/17/2021 1500   LEUKOCYTESUR NEGATIVE 03/17/2021 1500    Radiological Exams on Admission: CT ABDOMEN PELVIS W CONTRAST  Result Date: 03/17/2021 CLINICAL DATA:  Weakness and vomiting. Prior gallbladder stent. History of diabetic ketoacidosis. EXAM: CT ABDOMEN AND PELVIS WITH CONTRAST TECHNIQUE: Multidetector CT imaging of the abdomen and pelvis was performed using the standard protocol following bolus administration of intravenous contrast. CONTRAST:  OMNIPAQUE IOHEXOL 300 MG/ML  SOLN COMPARISON:  Chest radiograph of earlier today. FINDINGS: Lower chest: Clear lung bases. Normal heart size without pericardial or pleural effusion. Hepatobiliary: Hepatic steatosis, without focal liver lesion. Cholecystectomy. Pancreas: The pancreatic body and tail are either markedly atrophic or surgically absent. There may be minimal residual pancreatic head parenchyma. Spleen: Surgically absent Adrenals/Urinary Tract: Normal adrenal glands. Normal kidneys, without hydronephrosis. Normal  urinary bladder. Stomach/Bowel: Suspect mild gastric wall and fold thickening, including on 24/2 within the body. Suspect gastro jejunostomy. Ritik Stavola catheter/stent is positioned within right upper quadrant small bowel, including on 33/4. This may originate in the region of the remaining pancreatic head. Diffuse colonic underdistention.  No small bowel obstruction. Vascular/Lymphatic: Aortic atherosclerosis. The celiac is diminutive including on 16/2. The SMA is widely patent, as is the portal vein. No abdominopelvic adenopathy. Reproductive: Normal prostate. Other: No significant free fluid.  No free intraperitoneal air. Musculoskeletal: No acute osseous abnormality.  Disc bulge at L4-5. IMPRESSION: 1. Gastric body wall and fold thickening, suspicious for gastritis. No other acute process identified. 2. Extensive surgical changes, without pertinent clinical history. Suspect at least partial pancreatectomy and gastrojejunostomy (possibly Whipple procedure). Paz Fuentes catheter or stent within right abdominal small bowel may originate within the remaining pancreatic head, but is of indeterminate function/position. Question pancreatic duct stent which has passed into the jejunum. 3. Aortic Atherosclerosis (ICD10-I70.0). This is age advanced. Moderate narrowing of the celiac with widely patent  SMA. 4. Hepatic steatosis Electronically Signed   By: Jeronimo Greaves M.D.   On: 03/17/2021 16:29   DG Chest Portable 1 View  Result Date: 03/17/2021 CLINICAL DATA:  Kidney Aneeka Bowden. EXAM: PORTABLE CHEST 1 VIEW COMPARISON:  None FINDINGS: Trachea midline. Cardiomediastinal contours and hilar structures are normal. EKG leads project over the chest. On limited assessment no acute skeletal process. IMPRESSION: No active disease. Electronically Signed   By: Donzetta Kohut M.D.   On: 03/17/2021 13:18    EKG: Independently reviewed. Right axis, isolated q in avl with isolated t wave inversion - no priors for comparison   Assessment/Plan Principal  Problem:   Lactic acidosis Active Problems:   SIRS (systemic inflammatory response syndrome) (HCC)   DKA (diabetic ketoacidosis) (HCC)   Generalized weakness   H/O splenectomy   History of pancreatectomy   Hx of cholecystectomy   Hx of bypass gastrojejunostomy   Gastritis   Vomiting   Chronic pain    * Lactic acidosis Seems more impressive than would expect with his DKA CT with contrast without clear explanation Discussed with rads over phone, patent mesenteric vessels, but if not improving as expected, consider repeat CT scan with CT angio (moderate narrowing celiac, widely patent SMA) Concern for infection with report of chills, cold sweats Slowly improving, follow with IVF UDS, salicylate, APAP   SIRS (systemic inflammatory response syndrome) (HCC) Leukocytosis, intermittent tachycardia and tachypnea Concern for infection with reported chills, sweats Lactic elevated as above Follow blood cx UA not consistent with UTI - follow culture Negative covid/influenza Covering for intraabdominal infection for now, low threshold to broaden Continue IVF CT with gastritis (see report) - stent in small bowel of indeterminate function/position - will discuss with surgery (per discussion with surgery overnight, nothing to do regarding stent, they'll see in AM) CXR without active disease (consider CT chest - though no specific symptoms)  Generalized weakness Due to above  DKA (diabetic ketoacidosis) (HCC) Felt poorly yesterday, didn't eat so didn't take meds DKA could precipitate hypovolemia/lactic acidosis, etc (above), but also seems like infectious sx present? CO2 17, AG 19, normal pH on VBG -> seems mild for degree of lactic acidosis Insulin gtt per protocol Serial labs Discussed importance of not skipping basal Diabetes education   Gastritis PPI H pylori stool antigen Follow Noted on CT  Hx of bypass gastrojejunostomy Complex surgical hx related to etoh induced chronic  pancreatitis s/p total pancreatectomy, splenectomy and cholecystectomy with hepaticojejunostomy and gastrojejunostomy   Vomiting Due to DKA  Chronic pain suboxone     DVT prophylaxis: lovenox  Code Status:   full  Family Communication:  None at bedside  Disposition Plan:   Patient is from:  home  Anticipated DC to:  home  Anticipated DC date:     3+ days  Anticipated DC barriers: Improvement, further diagnosis and treatment  Consults called:  none  Admission status:  inpatient   Severity of Illness: The appropriate patient status for this patient is INPATIENT. Inpatient status is judged to be reasonable and necessary in order to provide the required intensity of service to ensure the patient's safety. The patient's presenting symptoms, physical exam findings, and initial radiographic and laboratory data in the context of their chronic comorbidities is felt to place them at high risk for further clinical deterioration. Furthermore, it is not anticipated that the patient will be medically stable for discharge from the hospital within 2 midnights of admission.   * I certify that at the point of  admission it is my clinical judgment that the patient will require inpatient hospital care spanning beyond 2 midnights from the point of admission due to high intensity of service, high risk for further deterioration and high frequency of surveillance required.Lacretia Nicks MD [facility]  How to contact the [facility] Attending or Consulting provider 7A - 7P or covering provider during after hours 7P -7A, for this patient?   Check the care team in [facility] and look for Chameka Mcmullen) attending/consulting [facility] provider listed and b) the [facility] team listed Log into [email or web address] and use [place]'s universal password to access. If you do not have the password, please contact the hospital operator. Locate the [facility] provider you are looking for under [facility] and page to Drina Jobst number that you can be  directly reached. If you still have difficulty reaching the provider, please page the [facility] (Director on Call) for the Hospitalists listed on amion for assistance.  03/17/2021, 10:05 PM

## 2021-03-17 NOTE — ED Notes (Signed)
RN gave Rose,RN ICU report at this time.

## 2021-03-17 NOTE — Consult Note (Signed)
Pharmacy Antibiotic Note  Ian Gross is a 45 y.o. male admitted on 03/17/2021 with  intra-abdominal infection .  Pharmacy has been consulted for cefepime dosing.  Plan: Cefepime 2 gram Q8H  Height: 6' (182.9 cm) Weight: 72.6 kg (160 lb) IBW/kg (Calculated) : 77.6  Temp (24hrs), Avg:97.8 F (36.6 C), Min:97.8 F (36.6 C), Max:97.8 F (36.6 C)  Recent Labs  Lab 03/17/21 1243 03/17/21 1428  WBC 14.2*  --   CREATININE 1.27*  --   LATICACIDVEN  --  6.6*    Estimated Creatinine Clearance: 73.8 mL/min (A) (by C-G formula based on SCr of 1.27 mg/dL (H)).    No Known Allergies  Antimicrobials this admission: 12/20 cefepime>>  12/20 metronidazole  >>   Dose adjustments this admission:   Microbiology results: 12/20 BCx: sent 12/20 UCx: sent   Thank you for allowing pharmacy to be a part of this patients care.  Sharen Hones, PharmD, BCPS Clinical Pharmacist   03/17/2021 6:08 PM

## 2021-03-17 NOTE — Progress Notes (Signed)
Elink is following Sepsis bundle   

## 2021-03-17 NOTE — ED Notes (Signed)
RN in contact with ICU charge nurse for assigned nurse.

## 2021-03-17 NOTE — ED Triage Notes (Signed)
Pt arrived via EMS for weakness and vomiting. Pt woke up feeling like this and was to weak to get out of the bathtub. Pt is currently having episodes of dry heaves. Pt skin is warm to touch but modeled like. Pt was his endocrinologist yesterday and was told his A1C is 7.2.

## 2021-03-17 NOTE — Assessment & Plan Note (Addendum)
Seems more impressive than would expect with his DKA CT with contrast without clear explanation Discussed with rads over phone, patent mesenteric vessels, but if not improving as expected, consider repeat CT scan with CT angio (moderate narrowing celiac, widely patent SMA) Concern for infection with report of chills, cold sweats Slowly improving, follow with IVF UDS, salicylate, APAP

## 2021-03-17 NOTE — Assessment & Plan Note (Signed)
Due to DKA

## 2021-03-17 NOTE — Assessment & Plan Note (Signed)
Due to above 

## 2021-03-17 NOTE — ED Notes (Signed)
Blood cx's x2 & blue top tube sent to lab.

## 2021-03-17 NOTE — ED Notes (Signed)
Informed RN Brandon of bed assignment °

## 2021-03-17 NOTE — Assessment & Plan Note (Signed)
PPI H pylori stool antigen Follow Noted on CT

## 2021-03-17 NOTE — ED Notes (Signed)
RN notified Darl Pikes in pharmacy of the need for the D5% in LR infusion.

## 2021-03-17 NOTE — ED Provider Notes (Signed)
[facility] Emergency Department Provider Note   ____________________________________________   Event Date/Time   First MD Initiated Contact with Patient 03/17/21 1220     (approximate)  I have reviewed the triage vital signs and the nursing notes.   HISTORY  Chief Complaint Weakness   HPI Chavis Tessler is a 45 y.o. male who comes in complaining of weakness and vomiting.  He said he was very weak and was too weak to get out of the bathtub.  He reports he had surgery and had a gallbladder stent put and had his gallbladder taken out as well as a spleen and pancreas.        Past Medical History:  Diagnosis Date   Diabetes mellitus without complication [facility])     Patient Active Problem List   Diagnosis Date Noted   Lactic acidosis 03/17/2021   DKA (diabetic ketoacidoses) 07/31/2019   Abdominal pain 07/31/2019      Prior to Admission medications   Medication Sig Start Date End Date Taking? Authorizing Provider  Buprenorphine HCl-Naloxone HCl 8-2 MG FILM Place 1 strip under the tongue 2 (two) times daily. 11/15/20  Yes [provider]  HUMALOG KWIKPEN 100 UNIT/ML KwikPen Inject 4-9 Units into the skin 3 (three) times daily. Sliding scale (go up by 1 unit) 12/28/20  Yes [provider]  insulin glargine (LANTUS) 100 UNIT/ML injection Inject 10 Units into the skin at bedtime.   Yes [provider]  Pancrelipase, Lip-Prot-Amyl, (CREON) 24000-76000 units CPEP Take 2-3 capsules by mouth 3 (three) times daily. Takes 3 capsules with each meal and 2 capsules with each snack   Yes [provider]    Allergies Patient has no known allergies.  No family history on file.  Social History Social History   Tobacco Use   Smoking status: Every Day   Smokeless tobacco: Never  Substance Use Topics   Alcohol use: Not Currently   Drug use: Not Currently    Review of Systems  Constitutional: No fever/chills Eyes: No  visual changes. ENT: No sore throat. Cardiovascular: Denies chest pain. Respiratory: Denies shortness of breath but breathing somewhat fast. Gastrointestinal: No abdominal pain.  No nausea, no vomiting.  No diarrhea.  No constipation. Genitourinary: Negative for dysuria. Musculoskeletal: Negative for back pain. Skin: Negative for rash. Neurological: Negative for headaches, focal weakness   ____________________________________________   PHYSICAL EXAM:  VITAL SIGNS: ED Triage Vitals  Enc Vitals Group     BP 03/17/21 1200 (!) 105/38     Pulse Rate 03/17/21 1200 82     Resp 03/17/21 1200 14     Temp 03/17/21 1200 97.8 F (36.6 C)     Temp Source 03/17/21 1200 Oral     SpO2 03/17/21 1200 99 %     Weight 03/17/21 1211 160 lb (72.6 kg)     Height --      Head Circumference --      Peak Flow --      Pain Score 03/17/21 1211 0     Pain Loc --      Pain Edu? --      Excl. in GC? --     Constitutional: Alert and oriented.  Looks weak and somewhat gray Eyes: Conjunctivae are normal.  Head: Atraumatic. Nose: No congestion/rhinnorhea. Mouth/Throat: Mucous membranes are moist.  Oropharynx non-erythematous.  Lips are gray Neck: No stridor.  Cardiovascular: Normal rate, regular rhythm. Grossly normal heart sounds.  Good peripheral circulation. Respiratory: Normal respiratory effort.  No retractions. Lungs CTAB. Gastrointestinal: Soft and nontender. No distention. No abdominal bruits.  Musculoskeletal: No lower extremity tenderness nor edema.   Neurologic:  Normal speech and language. No gross focal neurologic deficits are appreciated.  Skin:  Skin is warm, dry and intact. No rash noted.   ____________________________________________   LABS (all labs ordered are listed, but only abnormal results are displayed)  Labs Reviewed  URINALYSIS, ROUTINE W REFLEX MICROSCOPIC - Abnormal; Notable for the following components:      Result Value   Color, Urine YELLOW (*)    APPearance  CLEAR (*)    Glucose, UA >=500 (*)    Ketones, ur 5 (*)    All other components within normal limits  BETA-HYDROXYBUTYRIC ACID - Abnormal; Notable for the following components:   Beta-Hydroxybutyric Acid 0.73 (*)    All other components within normal limits  COMPREHENSIVE METABOLIC PANEL - Abnormal; Notable for the following components:   CO2 17 (*)    Glucose, Bld 377 (*)    Creatinine, Ser 1.27 (*)    AST 61 (*)    Alkaline Phosphatase 177 (*)    Anion gap 19 (*)    All other components within normal limits  CBC WITH DIFFERENTIAL/PLATELET - Abnormal; Notable for the following components:   WBC 14.2 (*)    MCV 100.2 (*)    Neutro Abs 8.5 (*)    Basophils Absolute 0.2 (*)    Abs Immature Granulocytes 0.69 (*)    All other components within normal limits  BLOOD GAS, VENOUS - Abnormal; Notable for the following components:   pO2, Ven 47.0 (*)    Acid-base deficit 7.0 (*)    All other components within normal limits  LACTIC ACID, PLASMA - Abnormal; Notable for the following components:   Lactic Acid, Venous 6.6 (*)    All other components within normal limits  CBG MONITORING, ED - Abnormal; Notable for the following components:   Glucose-Capillary 379 (*)    All other components within normal limits  RESP PANEL BY RT-PCR (FLU A&B, COVID) ARPGX2  CULTURE, BLOOD (ROUTINE X 2)  CULTURE, BLOOD (ROUTINE X 2)  URINE CULTURE  RESP PANEL BY RT-PCR (FLU A&B, COVID) ARPGX2  LACTIC ACID, PLASMA  PROTIME-INR  APTT  PROCALCITONIN  CBG MONITORING, ED  TROPONIN I (HIGH SENSITIVITY)  TROPONIN I (HIGH SENSITIVITY)   ____________________________________________  EKG  EKG read interpreted by me shows sinus bradycardia at 58 normal axis no acute ST-T wave changes ____________________________________________  RADIOLOGY Jill Poling, personally viewed and evaluated these images (plain radiographs) as part of my medical decision making, as well as reviewing the written report by the  radiologist.  ED MD interpretation:    Official radiology report(s): DG Chest Portable 1 View  Result Date: 03/17/2021 CLINICAL DATA:  Kidney a. EXAM: PORTABLE CHEST 1 VIEW COMPARISON:  None FINDINGS: Trachea midline. Cardiomediastinal contours and hilar structures are normal. EKG leads project over the chest. On limited assessment no acute skeletal process. IMPRESSION: No active disease. Electronically Signed   By: Donzetta Kohut M.D.   On: 03/17/2021 13:18    ____________________________________________   PROCEDURES  Procedure(s) performed (including Critical Care): Critical care time half an hour.  This includes discussing the patient with the hospitalist and the other ER doctors I was going to sign him out right when the hospitalist called.  Then I spent some time evaluating the patient and reevaluating the patient again later as well as interpreting lab work and  of course putting the labs into the computer about 3 times because they kept getting hung up.  Additionally I have looked at his old records.  Procedures   ____________________________________________   INITIAL IMPRESSION / ASSESSMENT AND PLAN / ED COURSE        Clinical Course as of 03/17/21 1603  Tue Mar 17, 2021  1242 Beta-hydroxybutyric acid [PM]    Clinical Course User Index [PM] Arnaldo Natal, MD   We will get a CT of the abdomen at the request of the hospitalist because the patient's lab work is somewhat unusual with the lactic acid being so high and the pH and the venous blood gas not being off that much as well as the beta hydroxybutyric acid not being that elevated either.  Patient additionally does have a white count.  Patient did not have any abdominal pain when I examined him however just to be safe we will do this.  ____________________________________________   FINAL CLINICAL IMPRESSION(S) / ED DIAGNOSES  Final diagnoses:  Diabetic ketoacidosis without coma associated with type 1 diabetes  [facility])     ED Discharge Orders     None        Note:  This document was prepared using Dragon voice recognition software and may include unintentional dictation errors.    Arnaldo Natal, MD 03/17/21 [phone]

## 2021-03-17 NOTE — Assessment & Plan Note (Signed)
Felt poorly yesterday, didn't eat so didn't take meds DKA could precipitate hypovolemia/lactic acidosis, etc (above), but also seems like infectious sx present? CO2 17, AG 19, normal pH on VBG -> seems mild for degree of lactic acidosis Insulin gtt per protocol Serial labs Discussed importance of not skipping basal Diabetes education

## 2021-03-17 NOTE — Assessment & Plan Note (Addendum)
Leukocytosis, intermittent tachycardia and tachypnea Concern for infection with reported chills, sweats Lactic elevated as above Follow blood cx UA not consistent with UTI - follow culture Negative covid/influenza Covering for intraabdominal infection for now, low threshold to broaden Continue IVF CT with gastritis (see report) - stent in small bowel of indeterminate function/position - will discuss with surgery (per discussion with surgery overnight, nothing to do regarding stent, they'll see in AM) CXR without active disease (consider CT chest - though no specific symptoms)

## 2021-03-17 NOTE — Consult Note (Signed)
CODE SEPSIS - PHARMACY COMMUNICATION  **Broad Spectrum Antibiotics should be administered within 1 hour of Sepsis diagnosis**  Time Code Sepsis Called/Page Received: 1609  Antibiotics Ordered: 1609  Time of 1st antibiotic administration: 1615      Sharen Hones ,PharmD,BCPS Clinical Pharmacist  03/17/2021  6:10 PM

## 2021-03-17 NOTE — Assessment & Plan Note (Signed)
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

## 2021-03-17 NOTE — Assessment & Plan Note (Signed)
suboxone

## 2021-03-18 LAB — GLUCOSE, CAPILLARY
Glucose-Capillary: 102 mg/dL — ABNORMAL HIGH (ref 70–99)
Glucose-Capillary: 141 mg/dL — ABNORMAL HIGH (ref 70–99)
Glucose-Capillary: 147 mg/dL — ABNORMAL HIGH (ref 70–99)
Glucose-Capillary: 151 mg/dL — ABNORMAL HIGH (ref 70–99)
Glucose-Capillary: 195 mg/dL — ABNORMAL HIGH (ref 70–99)
Glucose-Capillary: 262 mg/dL — ABNORMAL HIGH (ref 70–99)
Glucose-Capillary: 59 mg/dL — ABNORMAL LOW (ref 70–99)
Glucose-Capillary: 86 mg/dL (ref 70–99)
Glucose-Capillary: 98 mg/dL (ref 70–99)

## 2021-03-18 LAB — CBC
HCT: 32.1 % — ABNORMAL LOW (ref 39.0–52.0)
Hemoglobin: 11 g/dL — ABNORMAL LOW (ref 13.0–17.0)
MCH: 32.8 pg (ref 26.0–34.0)
MCHC: 34.3 g/dL (ref 30.0–36.0)
MCV: 95.8 fL (ref 80.0–100.0)
Platelets: 268 10*3/uL (ref 150–400)
RBC: 3.35 MIL/uL — ABNORMAL LOW (ref 4.22–5.81)
RDW: 14 % (ref 11.5–15.5)
WBC: 21.5 10*3/uL — ABNORMAL HIGH (ref 4.0–10.5)
nRBC: 0 % (ref 0.0–0.2)

## 2021-03-18 LAB — ACETAMINOPHEN LEVEL: Acetaminophen (Tylenol), Serum: <10 ug/mL — ABNORMAL LOW (ref 10–30)

## 2021-03-18 LAB — COMPREHENSIVE METABOLIC PANEL
ALT: 22 U/L (ref 0–44)
AST: 24 U/L (ref 15–41)
Albumin: 2.5 g/dL — ABNORMAL LOW (ref 3.5–5.0)
Alkaline Phosphatase: 121 U/L (ref 38–126)
Anion gap: 3 — ABNORMAL LOW (ref 5–15)
BUN: 20 mg/dL (ref 6–20)
CO2: 25 mmol/L (ref 22–32)
Calcium: 7.8 mg/dL — ABNORMAL LOW (ref 8.9–10.3)
Chloride: 108 mmol/L (ref 98–111)
Creatinine, Ser: 0.96 mg/dL (ref 0.61–1.24)
GFR, Estimated: >60 mL/min (ref >60)
Glucose, Bld: 213 mg/dL — ABNORMAL HIGH (ref 70–99)
Potassium: 4 mmol/L (ref 3.5–5.1)
Sodium: 136 mmol/L (ref 135–145)
Total Bilirubin: 0.6 mg/dL (ref 0.3–1.2)
Total Protein: 4.6 g/dL — ABNORMAL LOW (ref 6.5–8.1)

## 2021-03-18 LAB — BETA-HYDROXYBUTYRIC ACID
Beta-Hydroxybutyric Acid: 0.16 mmol/L (ref 0.05–0.27)
Beta-Hydroxybutyric Acid: 0.61 mmol/L — ABNORMAL HIGH (ref 0.05–0.27)

## 2021-03-18 LAB — SALICYLATE LEVEL: Salicylate Lvl: <7 mg/dL — ABNORMAL LOW (ref 7.0–30.0)

## 2021-03-18 LAB — LACTIC ACID, PLASMA: Lactic Acid, Venous: 0.9 mmol/L (ref 0.5–1.9)

## 2021-03-18 NOTE — Progress Notes (Signed)
PROGRESS NOTE    Ian Gross  KVQ:[id number] DOB: [birth year] DOA: 03/17/2021 PCP: [facility]    Brief Narrative:  Ian Gross is a 45 y.o. 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   12/19 around 3 PM he started feeling poorly.  Developed N/V.  Felt bad all evening.  Just laid in the bathtub for like 4 hrs, barely got any sleep.  Around 6 am, took a bath, threw up again, and laid in the bathtub for about 4 more hours.  He came to the ED due to continued discomfort and weakness.  7-8 episodes of bilious emesis.  No pain, maybe fevers.  +chills, + cold sweats, + cough (related to dry heaving), + sore throat (related to emesis).  No HA.  No CP or SOB.  No change in stools.     ED Course: Admit for DKA and lactic acidosis.  Requested CT scan.  Admit to hospitalist.  12/21 feels much better today.  No shortness of breath or chest pain.  No abdominal pain.   Consultants:  Diabetic educator  Procedures:   Antimicrobials:      Subjective: No nausea or vomiting.  Has mild sore throat  Objective: Vitals:   03/18/21 0800 03/18/21 0900 03/18/21 1100 03/18/21 1300  BP: 103/63 124/66 119/72   Pulse: 61 63 69 63  Resp: 11 11 (!) 8 11  Temp: 98.5 F (36.9 C)   98.6 F (37 C)  TempSrc: Oral   Oral  SpO2: 98% 98% 100% 98%  Weight:      Height:        Intake/Output Summary (Last 24 hours) at 03/18/2021 1628 Last data filed at 03/18/2021 1000 Gross per 24 hour  Intake 1808.43 ml  Output 150 ml  Net 1658.43 ml   Filed Weights   03/17/21 1211 03/17/21 2045  Weight: 72.6 kg 56.2 kg    Examination:  General exam: Appears calm and comfortable  Respiratory system: Clear to auscultation. Respiratory effort normal. Cardiovascular  system: S1 & S2 heard, RRR. No JVD, murmurs, rubs, gallops or clicks.  Gastrointestinal system: Abdomen is nondistended, soft and nontender. . Normal bowel sounds heard. Central nervous system: Alert and oriented. No focal neurological deficits. Extremities: No edema Psychiatry: Judgement and insight appear normal. Mood & affect appropriate.     Data Reviewed: I have personally reviewed following labs and imaging studies  CBC: Recent Labs  Lab 03/17/21 1243 03/18/21 0405  WBC 14.2* 21.5*  NEUTROABS 8.5*  --   HGB 14.5 11.0*  HCT 43.7 32.1*  MCV 100.2* 95.8  PLT 339 268   Basic Metabolic Panel: Recent Labs  Lab 03/17/21 1243 03/17/21 1736 03/17/21 2135 03/18/21 0405  NA 136 136 138 136  K 4.5 4.3 4.1 4.0  CL 100 106 107 108  CO2 17* 20* 25 25  GLUCOSE 377* 360* 160* 213*  BUN 18 19 19 20   CREATININE 1.27* 1.25* 1.01 0.96  CALCIUM 9.2 8.0* 8.0* 7.8*   GFR: Estimated Creatinine Clearance: 75.6 mL/min (by C-G formula based on SCr of 0.96 mg/dL). Liver Function Tests: Recent Labs  Lab 03/17/21 1243 03/18/21 0405  AST 61* 24  ALT 30 22  ALKPHOS 177* 121  BILITOT 1.0 0.6  PROT 6.6 4.6*  ALBUMIN 3.7 2.5*   No results for input(s): LIPASE, AMYLASE in the last 168 hours. No  results for input(s): AMMONIA in the last 168 hours. Coagulation Profile: Recent Labs  Lab 03/17/21 1534  INR 1.1   Cardiac Enzymes: No results for input(s): CKTOTAL, CKMB, CKMBINDEX, TROPONINI in the last 168 hours. BNP (last 3 results) No results for input(s): PROBNP in the last 8760 hours. HbA1C: No results for input(s): HGBA1C in the last 72 hours. CBG: Recent Labs  Lab 03/18/21 0010 03/18/21 0111 03/18/21 0321 03/18/21 0740 03/18/21 1119  GLUCAP 141* 151* 195* 102* 98   Lipid Profile: No results for input(s): CHOL, HDL, LDLCALC, TRIG, CHOLHDL, LDLDIRECT in the last 72 hours. Thyroid Function Tests: No results for input(s): TSH, T4TOTAL, FREET4, T3FREE, THYROIDAB in the last  72 hours. Anemia Panel: No results for input(s): VITAMINB12, FOLATE, FERRITIN, TIBC, IRON, RETICCTPCT in the last 72 hours. Sepsis Labs: Recent Labs  Lab 03/17/21 1534 03/17/21 1736 03/17/21 1800 03/17/21 2141 03/18/21 0003  PROCALCITON <0.10  --   --   --   --   LATICACIDVEN  --  6.1* 5.6* 2.4* 0.9    Recent Results (from the past 240 hour(s))  Resp Panel by RT-PCR (Flu A&B, Covid)     Status: None   Collection Time: 03/17/21 12:43 PM   Specimen: Nasopharyngeal(NP) swabs in vial transport medium  Result Value Ref Range Status   SARS Coronavirus 2 by RT PCR NEGATIVE NEGATIVE Final    Comment: (NOTE) SARS-CoV-2 target nucleic acids are NOT DETECTED.  The SARS-CoV-2 RNA is generally detectable in upper respiratory specimens during the acute phase of infection. The lowest concentration of SARS-CoV-2 viral copies this assay can detect is 138 copies/mL. A negative result does not preclude SARS-Cov-2 infection and should not be used as the sole basis for treatment or other patient management decisions. A negative result may occur with  improper specimen collection/handling, submission of specimen other than nasopharyngeal swab, presence of viral mutation(s) within the areas targeted by this assay, and inadequate number of viral copies(<138 copies/mL). A negative result must be combined with clinical observations, patient history, and epidemiological information. The expected result is Negative.  Fact Sheet for Patients:  BloggerCourse.com  Fact Sheet for Healthcare Providers:  SeriousBroker.it  This test is no t yet approved or cleared by the [place] FDA and  has been authorized for detection and/or diagnosis of SARS-CoV-2 by FDA under an Emergency Use Authorization (EUA). This EUA will remain  in effect (meaning this test can be used) for the duration of the COVID-19 declaration under Section 564(b)(1) of the Act,  21 U.S.C.section 360bbb-3(b)(1), unless the authorization is terminated  or revoked sooner.       Influenza A by PCR NEGATIVE NEGATIVE Final   Influenza B by PCR NEGATIVE NEGATIVE Final    Comment: (NOTE) The Xpert Xpress SARS-CoV-2/FLU/RSV plus assay is intended as an aid in the diagnosis of influenza from Nasopharyngeal swab specimens and should not be used as a sole basis for treatment. Nasal washings and aspirates are unacceptable for Xpert Xpress SARS-CoV-2/FLU/RSV testing.  Fact Sheet for Patients: BloggerCourse.com  Fact Sheet for Healthcare Providers: SeriousBroker.it  This test is not yet approved or cleared by the [place] FDA and has been authorized for detection and/or diagnosis of SARS-CoV-2 by FDA under an Emergency Use Authorization (EUA). This EUA will remain in effect (meaning this test can be used) for the duration of the COVID-19 declaration under Section 564(b)(1) of the Act, 21 U.S.C. section 360bbb-3(b)(1), unless the authorization is terminated or revoked.  Performed at [facility],  [address] [place], [place] [address]   Culture, blood (routine x 2)     Status: None (Preliminary result)   Collection Time: 03/17/21  3:34 PM   Specimen: BLOOD  Result Value Ref Range Status   Specimen Description BLOOD BLOOD LEFT FOREARM  Final   Special Requests   Final    BOTTLES DRAWN AEROBIC AND ANAEROBIC Blood Culture adequate volume   Culture   Final    NO GROWTH < 24 HOURS Performed at [facility], [address], [place], [place] [address]    Report Status PENDING  Incomplete  Culture, blood (routine x 2)     Status: None (Preliminary result)   Collection Time: 03/17/21  3:34 PM   Specimen: BLOOD  Result Value Ref Range Status   Specimen Description BLOOD LEFT ANTECUBITAL  Final   Special Requests   Final    BOTTLES DRAWN AEROBIC AND ANAEROBIC Blood Culture adequate volume    Culture   Final    NO GROWTH < 24 HOURS Performed at [facility], [address] [place], [place] [address]    Report Status PENDING  Incomplete  MRSA Next Gen by PCR, Nasal     Status: None   Collection Time: 03/17/21  8:51 PM   Specimen: Nasal Mucosa; Nasal Swab  Result Value Ref Range Status   MRSA by PCR Next Gen NOT DETECTED NOT DETECTED Final    Comment: (NOTE) The GeneXpert MRSA Assay (FDA approved for NASAL specimens only), is one component of a comprehensive MRSA colonization surveillance program. It is not intended to diagnose MRSA infection nor to guide or monitor treatment for MRSA infections. Test performance is not FDA approved in patients less than 73 years old. Performed at [facility], [address] [place], [place] [address]          Radiology Studies: CT ABDOMEN PELVIS W CONTRAST  Result Date: 03/17/2021 CLINICAL DATA:  Weakness and vomiting. Prior gallbladder stent. History of diabetic ketoacidosis. EXAM: CT ABDOMEN AND PELVIS WITH CONTRAST TECHNIQUE: Multidetector CT imaging of the abdomen and pelvis was performed using the standard protocol following bolus administration of intravenous contrast. CONTRAST:  OMNIPAQUE IOHEXOL 300 MG/ML  SOLN COMPARISON:  Chest radiograph of earlier today. FINDINGS: Lower chest: Clear lung bases. Normal heart size without pericardial or pleural effusion. Hepatobiliary: Hepatic steatosis, without focal liver lesion. Cholecystectomy. Pancreas: The pancreatic body and tail are either markedly atrophic or surgically absent. There may be minimal residual pancreatic head parenchyma. Spleen: Surgically absent Adrenals/Urinary Tract: Normal adrenal glands. Normal kidneys, without hydronephrosis. Normal urinary bladder. Stomach/Bowel: Suspect mild gastric wall and fold thickening, including on 24/2 within the body. Suspect gastro jejunostomy. A catheter/stent is positioned within right upper quadrant small bowel,  including on 33/4. This may originate in the region of the remaining pancreatic head. Diffuse colonic underdistention.  No small bowel obstruction. Vascular/Lymphatic: Aortic atherosclerosis. The celiac is diminutive including on 16/2. The SMA is widely patent, as is the portal vein. No abdominopelvic adenopathy. Reproductive: Normal prostate. Other: No significant free fluid.  No free intraperitoneal air. Musculoskeletal: No acute osseous abnormality.  Disc bulge at L4-5. IMPRESSION: 1. Gastric body wall and fold thickening, suspicious for gastritis. No other acute process identified. 2. Extensive surgical changes, without pertinent clinical history. Suspect at least partial pancreatectomy and gastrojejunostomy (possibly Whipple procedure). A catheter or stent within right abdominal small bowel may originate within the remaining pancreatic head, but is of indeterminate function/position. Question pancreatic duct  stent which has passed into the jejunum. 3. Aortic Atherosclerosis (ICD10-I70.0). This is age advanced. Moderate narrowing of the celiac with widely patent SMA. 4. Hepatic steatosis Electronically Signed   By: Jeronimo Greaves M.D.   On: 03/17/2021 16:29   DG Chest Portable 1 View  Result Date: 03/17/2021 CLINICAL DATA:  Kidney a. EXAM: PORTABLE CHEST 1 VIEW COMPARISON:  None FINDINGS: Trachea midline. Cardiomediastinal contours and hilar structures are normal. EKG leads project over the chest. On limited assessment no acute skeletal process. IMPRESSION: No active disease. Electronically Signed   By: Donzetta Kohut M.D.   On: 03/17/2021 13:18        Scheduled Meds:  buprenorphine-naloxone  1 tablet Sublingual BID   Chlorhexidine Gluconate Cloth  6 each Topical Q0600   enoxaparin (LOVENOX) injection  40 mg Subcutaneous Q24H   insulin aspart  0-9 Units Subcutaneous Q4H   insulin glargine-yfgn  10 Units Subcutaneous QHS   lipase/protease/amylase  72,000 Units Oral TID AC   nicotine  14 mg  Transdermal Daily   pantoprazole (PROTONIX) IV  40 mg Intravenous Q12H   Continuous Infusions:  ceFEPime (MAXIPIME) IV 2 g (03/18/21 1500)   lactated ringers 150 mL/hr at 03/18/21 0336   metronidazole 500 mg (03/18/21 0531)    Assessment & Plan:   Principal Problem:   Lactic acidosis Active Problems:   DKA (diabetic ketoacidosis) (HCC)   SIRS (systemic inflammatory response syndrome) (HCC)   H/O splenectomy   History of pancreatectomy   Hx of cholecystectomy   Hx of bypass gastrojejunostomy   Gastritis   Generalized weakness   Chronic pain   Vomiting   *  DKA (diabetic ketoacidosis) (HCC) Felt poorly yesterday, didn't eat so didn't take meds DKA could precipitate hypovolemia/lactic acidosis, etc (above), but also seems like infectious sx present? CO2 17, AG 19, normal pH on VBG -> seems mild for degree of lactic acidosis 12/21 treated with insulin drip on DKA protocol Diabetic educator following Lactic acidosis improved Continue with ivf. Pt did tell me today he had not eaten at home b/c he was sick and didn't take his insulin.  Urine tox pending      SIRS (systemic inflammatory response syndrome) (HCC) Negative covid/influenza Covering for intraabdominal infection for now, low threshold to broaden Continue IVF CT with gastritis (see report) - stent in small bowel of indeterminate function/position - will discuss with surgery (per discussion with surgery overnight, nothing to do regarding stent, they'll see in AM) CXR without active disease (consider CT chest - though no specific symptoms) 12/21 leukocytosis elevated chronically. Will monitor F/u cx    Generalized weakness Due to above      Gastritis Continue PPI  H. pylori stool antigen  Noted on CT   Hx of bypass gastrojejunostomy Complex surgical hx related to etoh induced chronic pancreatitis s/p total pancreatectomy, splenectomy and cholecystectomy with hepaticojejunostomy and gastrojejunostomy        Chronic pain suboxone    DVT prophylaxis: Lovenox Code Status: Full Family Communication: None at bedside Disposition Plan:  Status is: Inpatient  Remains inpatient appropriate because: IV treatment            LOS: 1 day   Time spent: 35 minutes with more than 50% on COC    Lynn Ito, MD [facility] Pager [phone]  If 7PM-7AM, please contact night-coverage 03/18/2021, 4:28 PM

## 2021-03-18 NOTE — Consult Note (Signed)
Pharmacy Antibiotic Note  Ian Gross is a 45 y.o. male with PMH of diabetes who was admitted on 03/17/2021 with  intra-abdominal infection .  Pharmacy has been consulted for cefepime dosing.  Afeb>24h, WBC 14.2>21.5  Bcx and Ucx sent, MRSA PCR negative  Plan: Cefepime  Will continue cefepime 2 gram Q8H  Will continue to monitor renal function and cultures and adjust dose as clinically indicated   Height: 6' (182.9 cm) Weight: 56.2 kg (123 lb 14.4 oz) IBW/kg (Calculated) : 77.6  Temp (24hrs), Avg:98.7 F (37.1 C), Min:97.8 F (36.6 C), Max:99.3 F (37.4 C)  Recent Labs  Lab 03/17/21 1243 03/17/21 1428 03/17/21 1736 03/17/21 1800 03/17/21 2135 03/17/21 2141 03/18/21 0003 03/18/21 0405  WBC 14.2*  --   --   --   --   --   --  21.5*  CREATININE 1.27*  --  1.25*  --  1.01  --   --  0.96  LATICACIDVEN  --  6.6* 6.1* 5.6*  --  2.4* 0.9  --      Estimated Creatinine Clearance: 75.6 mL/min (by C-G formula based on SCr of 0.96 mg/dL).    No Known Allergies  Antimicrobials this admission: 12/20 cefepime>>  12/20 metronidazole  >>   Dose adjustments this admission:   Microbiology results: 12/20 BCx: sent 12/20 UCx: sent  12/20 MRSA PCR: negative   Thank you for allowing pharmacy to be a part of this patients care.  Doloris Hall, PharmD Pharmacy Resident  03/18/2021 10:53 AM

## 2021-03-18 NOTE — Progress Notes (Addendum)
Inpatient Diabetes Program Recommendations  AACE/ADA: New Consensus Statement on Inpatient Glycemic Control   Target Ranges:  Prepandial:   less than 140 mg/dL      Peak postprandial:   less than 180 mg/dL (1-2 hours)      Critically ill patients:  140 - 180 mg/dL    Latest Reference Range & Units 03/18/21 00:10 03/18/21 01:11 03/18/21 03:21  Glucose-Capillary 70 - 99 mg/dL 696 (H) 789 (H) 381 (H)    Latest Reference Range & Units 03/17/21 11:58 03/17/21 15:54 03/17/21 17:46 03/17/21 18:49 03/17/21 20:05 03/17/21 20:55 03/17/21 22:07 03/17/21 23:17  Glucose-Capillary 70 - 99 mg/dL 017 (H) 510 (H) 258 (H) 206 (H) 134 (H) 135 (H) 151 (H) 144 (H)    Latest Reference Range & Units 03/17/21 12:43  CO2 22 - 32 mmol/L 17 (L)  Glucose 70 - 99 mg/dL 527 (H)  Anion gap 5 - 15  19 (H)    Latest Reference Range & Units 03/17/21 12:43  Beta-Hydroxybutyric Acid 0.05 - 0.27 mmol/L 0.73 (H)   Review of Glycemic Control  Diabetes history: Treated as DM1 (had pancreatectomy Feb 2021) Outpatient Diabetes medications: Lantus 10 units QHS, Humalog 4-9 units with meals plus correction insulin Current orders for Inpatient glycemic control: Semglee 10 units QHS, Novolog 0-9 units Q4H  Inpatient Diabetes Program Recommendations:    Insulin: Please consider ordering Novolog 3 units TID with meals for meal coverage if patient eats at least 50% of meals.  Outpatient: At time of discharge, please provide Rx for Lantus SoloStar 10 units daily (#[id number]).  NOTE: In reviewing chart, noted patient was dx with pancreatic insufficiency 10/2018 and had pancreatectomy Feb 2021. Patient is followed by [facility] and treated as having Type 1 DM. Patient last seen Dr. Yetta Barre [facility]) on 03/16/21 and A1C was 7.2% at that time.  Patient admitted with DKA, lactic acidosis, SIRS, and gastritis with initial lab glucose of 377 mg/dl on 35/36/14 and DKA was treated with IV insulin. Patient was transitioned from IV  to SQ insulin during the night and received Semglee 10 units at 1:14 am today. Per H&P, patient felt poorly and not able to eat so he did not take his DM medication. Will continue to follow along while inpatient.  Addendum 03/18/21@10 :55-Spoke with patient about diabetes and home regimen for diabetes control. Patient reports being followed by [facility] Endocr[facility] and he confirms that he seen Dr. Yetta Barre on 03/16/21. Patient reports that he is prescribed Lantus 10 units QHS, Humalog 4-9 units with meals, plus Humalog 1:50 mg/dl (1 unit for every 50 mg/dl above target glucose).  Patient states that when he seen Dr. Yetta Barre on 03/16/21, he noticed on his paperwork that she prescribed Tresiba 9 units QHS (instead of Lantus).  Patient states that he is running low on Lantus insulin he has at home. [facility] [place] to be sure patient has prescription refill for Lantus (or [place]). Patient uses a FreeStyle Libre CGM sensor for glucose monitoring ane he reports that overall his glucose trends well and his A1C was 7.2% on 03/16/21.  Patient notes that he was not able to eat or keep anything down for past couple of days so he was not taking his insulin. Discussed sick day guidelines and explained that since he had pancreatectomy that he needs to take his basal insulin, monitor glucose about every 4 hours and take Humalog insulin per correction scale if needed (based on glucose). Stressed importance of taking insulin to prevent DKA.  Patient states he was not aware that he still needed to take insulin when he was sick and not able to eat or drink.  Discussed impact of nutrition, exercise, stress, sickness, and medications on diabetes control.  Patient had noted that sometimes after he injects insulin that some of it comes back out of injection site. Patient reports he mainly uses his abdomen for insulin injections. Visualized patient's abdomen and he has scar tissue on bilateral side of navel. Discussed staying away from  scar tissue as insulin is not absorbed well through scar tissues. Discussed other areas he could use for insulin injections and asked that if he uses his abdomen to stay away from the areas with scar tissue. Patient appreciative of information discussed and verbalized understanding and reports no further questions at this time related to diabetes. [facility] [place] and was informed that patient has a prescription for Lantus SoloStar pens but the dose is not clear so they have messaged patient's provider for clarification but have not gotten a response yet. Will ask that patient be provided with a prescription for Lantus SoloStar [phone]) at time of discharge.   Thanks, Orlando Penner, RN, MSN, CDE Diabetes Coordinator Inpatient Diabetes Program [phone] (Team Pager from 8am to 5pm)

## 2021-03-19 LAB — GLUCOSE, CAPILLARY
Glucose-Capillary: 113 mg/dL — ABNORMAL HIGH (ref 70–99)
Glucose-Capillary: 156 mg/dL — ABNORMAL HIGH (ref 70–99)
Glucose-Capillary: 175 mg/dL — ABNORMAL HIGH (ref 70–99)
Glucose-Capillary: 216 mg/dL — ABNORMAL HIGH (ref 70–99)
Glucose-Capillary: 217 mg/dL — ABNORMAL HIGH (ref 70–99)
Glucose-Capillary: 223 mg/dL — ABNORMAL HIGH (ref 70–99)
Glucose-Capillary: 233 mg/dL — ABNORMAL HIGH (ref 70–99)
Glucose-Capillary: 60 mg/dL — ABNORMAL LOW (ref 70–99)
Glucose-Capillary: 93 mg/dL (ref 70–99)

## 2021-03-19 LAB — CBC
HCT: 31.2 % — ABNORMAL LOW (ref 39.0–52.0)
Hemoglobin: 10.6 g/dL — ABNORMAL LOW (ref 13.0–17.0)
MCH: 32.6 pg (ref 26.0–34.0)
MCHC: 34 g/dL (ref 30.0–36.0)
MCV: 96 fL (ref 80.0–100.0)
Platelets: 247 10*3/uL (ref 150–400)
RBC: 3.25 MIL/uL — ABNORMAL LOW (ref 4.22–5.81)
RDW: 14.1 % (ref 11.5–15.5)
WBC: 11.7 10*3/uL — ABNORMAL HIGH (ref 4.0–10.5)
nRBC: 0 % (ref 0.0–0.2)

## 2021-03-19 LAB — BASIC METABOLIC PANEL
Anion gap: 4 — ABNORMAL LOW (ref 5–15)
BUN: 18 mg/dL (ref 6–20)
CO2: 27 mmol/L (ref 22–32)
Calcium: 7.9 mg/dL — ABNORMAL LOW (ref 8.9–10.3)
Chloride: 104 mmol/L (ref 98–111)
Creatinine, Ser: 0.82 mg/dL (ref 0.61–1.24)
GFR, Estimated: >60 mL/min (ref >60)
Glucose, Bld: 106 mg/dL — ABNORMAL HIGH (ref 70–99)
Potassium: 3.8 mmol/L (ref 3.5–5.1)
Sodium: 135 mmol/L (ref 135–145)

## 2021-03-19 LAB — URINE CULTURE: Culture: NO GROWTH

## 2021-03-19 LAB — HEMOGLOBIN A1C
Hgb A1c MFr Bld: 7.9 % — ABNORMAL HIGH (ref 4.8–5.6)
Mean Plasma Glucose: 180 mg/dL

## 2021-03-19 MED ORDER — INSULIN ASPART 100 UNIT/ML IJ SOLN: 0.0000 [IU] | Freq: Three times a day (TID) | INTRAMUSCULAR | Status: DC

## 2021-03-19 MED ORDER — INSULIN ASPART 100 UNIT/ML IJ SOLN
0.0000 [IU] | INTRAMUSCULAR | Status: DC
  Administered 2021-03-19: 21:00:00 2 [IU] via SUBCUTANEOUS
  Administered 2021-03-20: 12:00:00 1 [IU] via SUBCUTANEOUS
  Filled 2021-03-19 (×2): qty 1

## 2021-03-19 MED ORDER — INSULIN GLARGINE-YFGN 100 UNIT/ML ~~LOC~~ SOLN
8.0000 [IU] | Freq: Every day | SUBCUTANEOUS | Status: DC
  Administered 2021-03-19: 8 [IU] via SUBCUTANEOUS
  Filled 2021-03-19 (×2): qty 0.08

## 2021-03-19 MED ORDER — INSULIN ASPART 100 UNIT/ML IJ SOLN: 0.0000 [IU] | Freq: Every day | INTRAMUSCULAR | Status: DC

## 2021-03-19 NOTE — Progress Notes (Signed)
Inpatient Diabetes Program Recommendations  AACE/ADA: New Consensus Statement on Inpatient Glycemic Control   Target Ranges:  Prepandial:   less than 140 mg/dL      Peak postprandial:   less than 180 mg/dL (1-2 hours)      Critically ill patients:  140 - 180 mg/dL    Latest Reference Range & Units 03/19/21 00:13 03/19/21 04:43  Glucose-Capillary 70 - 99 mg/dL 528 (H) 93    Latest Reference Range & Units 03/18/21 07:40 03/18/21 11:19 03/18/21 16:47 03/18/21 19:46 03/18/21 22:16 03/18/21 23:30  Glucose-Capillary 70 - 99 mg/dL 413 (H) 98 244 (H)  Novolog 1 unit@ 17:10 262 (H)  Novolog 5 units@ 19:56  Semglee 10 units 86 59 (L)   Review of Glycemic Control  Diabetes history: Treated as DM1 (had pancreatectomy Feb 2021) Outpatient Diabetes medications: Lantus 10 units QHS, Humalog 4-9 units with meals plus correction insulin Current orders for Inpatient glycemic control: Semglee 10 units QHS, Novolog 0-9 units Q4H   Inpatient Diabetes Program Recommendations:     Insulin: Noted glucose down to 59 mg/dl at 01:02 on 72/53/66. Anticipate hypoglycemia due to Novolog correction (patient received 5 units for glucose 262 mg/dl and 1 unit drops glucose about 50 mg/dl).  Please consider changing Novolog correction to 0-6 units AC&HS and ordering Novolog 3 units TID with meals for meal coverage if patient eats at least 50% of meals.   Outpatient: At time of discharge, please provide Rx for Lantus SoloStar 10 units daily (#[id number]).   Thanks, Orlando Penner, RN, MSN, CDE Diabetes Coordinator Inpatient Diabetes Program [phone] (Team Pager from 8am to 5pm)

## 2021-03-19 NOTE — TOC Initial Note (Signed)
Transition of Care [facility]) - Initial/Assessment Note    Patient Details  Name: Ian Gross MRN: [id number] Date of Birth: 26-Oct-1975  Transition of Care [facility]) CM/SW Contact:    Chapman Fitch, RN Phone Number: 03/19/2021, 2:45 PM  Clinical Narrative:                  Transition of Care (TOC) Screening Note   Patient Details  Name: Ian Gross Date of Birth: [birth year]   Transition of Care [facility]) CM/SW Contact:    Chapman Fitch, RN Phone Number: 03/19/2021, 2:46 PM    Transition of Care Department [facility]) has reviewed patient and no TOC needs have been identified at this time. We will continue to monitor patient advancement through interdisciplinary progression rounds. If new patient transition needs arise, please place a TOC consult.          Patient Goals and CMS Choice        Expected Discharge Plan and Services                                                Prior Living Arrangements/Services                       Activities of Daily Living      Permission Sought/Granted                  Emotional Assessment              Admission diagnosis:  Lactic acidosis [E87.20] Diabetic ketoacidosis without coma associated with type 1 diabetes mellitus (HCC) [E10.10] Patient Active Problem List   Diagnosis Date Noted   Lactic acidosis 03/17/2021   SIRS (systemic inflammatory response syndrome) (HCC) 03/17/2021   H/O splenectomy 03/17/2021   History of pancreatectomy 03/17/2021   Hx of cholecystectomy 03/17/2021   Hx of bypass gastrojejunostomy 03/17/2021   Gastritis 03/17/2021   Generalized weakness 03/17/2021   Chronic pain 03/17/2021   Vomiting 03/17/2021   DKA (diabetic ketoacidosis) (HCC) 07/31/2019   Abdominal pain 07/31/2019   PCP:  [facility]:   [id number] [place] [place] - [address] [place] [address] Phone: [phone] Fax:  [phone]     Social Determinants of Health (SDOH) Interventions    Readmission Risk Interventions No flowsheet data found.

## 2021-03-19 NOTE — Progress Notes (Signed)
PROGRESS NOTE    Ian Gross  VPX:[id number] DOB: April 09, 1975 DOA: 03/17/2021 PCP: [facility]    Brief Narrative:  Ian Gross is a 45 y.o. 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   12/19 around 3 PM he started feeling poorly.  Developed N/V.  Felt bad all evening.  Just laid in the bathtub for like 4 hrs, barely got any sleep.  Around 6 am, took a bath, threw up again, and laid in the bathtub for about 4 more hours.  He came to the ED due to continued discomfort and weakness.  7-8 episodes of bilious emesis.  No pain, maybe fevers.  +chills, + cold sweats, + cough (related to dry heaving), + sore throat (related to emesis).  No HA.  No CP or SOB.  No change in stools.     ED Course: Admit for DKA and lactic acidosis.  Requested CT scan.  Admit to hospitalist.  12/21 feels much better today.  No shortness of breath or chest pain.  No abdominal pain. 12/22 feels better. D/w pt about giving scripts, he tells me he saw his endocrinologist Monday and was given script for his insulin. Had dry heave this am.    Consultants:  Diabetic educator  Procedures:   Antimicrobials:      Subjective: No vomiting, no sob, or cp  Objective: Vitals:   03/19/21 0900 03/19/21 1000 03/19/21 1204 03/19/21 1619  BP: 134/62 (!) 114/59 (!) 144/59 (!) 143/59  Pulse: (!) 54 72 86 (!) 58  Resp:    14  Temp:    98.4 F (36.9 C)  TempSrc:    Oral  SpO2: 99% 97% 98% 100%  Weight:      Height:        Intake/Output Summary (Last 24 hours) at 03/19/2021 1746 Last data filed at 03/19/2021 1640 Gross per 24 hour  Intake 6284.66 ml  Output 1050 ml  Net 5234.66 ml   Filed Weights   03/17/21 1211 03/17/21 2045  Weight: 72.6 kg 56.2 kg     Examination: Nad, calm Cta no w/r Reg s1/s2 no gallop Soft benign +bs No edema  [id number]    Data Reviewed: I have personally reviewed following labs and imaging studies  CBC: Recent Labs  Lab 03/17/21 1243 03/18/21 0405 03/19/21 0426  WBC 14.2* 21.5* 11.7*  NEUTROABS 8.5*  --   --   HGB 14.5 11.0* 10.6*  HCT 43.7 32.1* 31.2*  MCV 100.2* 95.8 96.0  PLT 339 268 247   Basic Metabolic Panel: Recent Labs  Lab 03/17/21 1243 03/17/21 1736 03/17/21 2135 03/18/21 0405 03/19/21 0426  NA 136 136 138 136 135  K 4.5 4.3 4.1 4.0 3.8  CL 100 106 107 108 104  CO2 17* 20* 25 25 27   GLUCOSE 377* 360* 160* 213* 106*  BUN 18 19 19 20 18   CREATININE 1.27* 1.25* 1.01 0.96 0.82  CALCIUM 9.2 8.0* 8.0* 7.8* 7.9*   GFR: Estimated Creatinine Clearance: 88.5 mL/min (by C-G formula based on SCr of 0.82 mg/dL). Liver Function Tests: Recent Labs  Lab 03/17/21 1243 03/18/21 0405  AST 61* 24  ALT 30 22  ALKPHOS 177* 121  BILITOT 1.0 0.6  PROT 6.6 4.6*  ALBUMIN 3.7 2.5*   No results for input(s): LIPASE, AMYLASE in the last 168 hours. No results for input(s): AMMONIA in  the last 168 hours. Coagulation Profile: Recent Labs  Lab 03/17/21 1534  INR 1.1   Cardiac Enzymes: No results for input(s): CKTOTAL, CKMB, CKMBINDEX, TROPONINI in the last 168 hours. BNP (last 3 results) No results for input(s): PROBNP in the last 8760 hours. HbA1C: Recent Labs    03/17/21 1736  HGBA1C 7.9*   CBG: Recent Labs  Lab 03/19/21 0748 03/19/21 0953 03/19/21 1138 03/19/21 1208 03/19/21 1739  GLUCAP 60* 175* 217* 216* 223*   Lipid Profile: No results for input(s): CHOL, HDL, LDLCALC, TRIG, CHOLHDL, LDLDIRECT in the last 72 hours. Thyroid Function Tests: No results for input(s): TSH, T4TOTAL, FREET4, T3FREE, THYROIDAB in the last 72 hours. Anemia Panel: No results for input(s): VITAMINB12, FOLATE, FERRITIN, TIBC, IRON, RETICCTPCT in the last 72 hours. Sepsis Labs: Recent Labs  Lab  03/17/21 1534 03/17/21 1736 03/17/21 1800 03/17/21 2141 03/18/21 0003  PROCALCITON <0.10  --   --   --   --   LATICACIDVEN  --  6.1* 5.6* 2.4* 0.9    Recent Results (from the past 240 hour(s))  Resp Panel by RT-PCR (Flu A&B, Covid)     Status: None   Collection Time: 03/17/21 12:43 PM   Specimen: Nasopharyngeal(NP) swabs in vial transport medium  Result Value Ref Range Status   SARS Coronavirus 2 by RT PCR NEGATIVE NEGATIVE Final    Comment: (NOTE) SARS-CoV-2 target nucleic acids are NOT DETECTED.  The SARS-CoV-2 RNA is generally detectable in upper respiratory specimens during the acute phase of infection. The lowest concentration of SARS-CoV-2 viral copies this assay can detect is 138 copies/mL. A negative result does not preclude SARS-Cov-2 infection and should not be used as the sole basis for treatment or other patient management decisions. A negative result may occur with  improper specimen collection/handling, submission of specimen other than nasopharyngeal swab, presence of viral mutation(s) within the areas targeted by this assay, and inadequate number of viral copies(<138 copies/mL). A negative result must be combined with clinical observations, patient history, and epidemiological information. The expected result is Negative.  Fact Sheet for Patients:  BloggerCourse.com  Fact Sheet for Healthcare Providers:  SeriousBroker.it  This test is no t yet approved or cleared by the [place] FDA and  has been authorized for detection and/or diagnosis of SARS-CoV-2 by FDA under an Emergency Use Authorization (EUA). This EUA will remain  in effect (meaning this test can be used) for the duration of the COVID-19 declaration under Section 564(b)(1) of the Act, 21 U.S.C.section 360bbb-3(b)(1), unless the authorization is terminated  or revoked sooner.       Influenza A by PCR NEGATIVE NEGATIVE Final   Influenza B by  PCR NEGATIVE NEGATIVE Final    Comment: (NOTE) The Xpert Xpress SARS-CoV-2/FLU/RSV plus assay is intended as an aid in the diagnosis of influenza from Nasopharyngeal swab specimens and should not be used as a sole basis for treatment. Nasal washings and aspirates are unacceptable for Xpert Xpress SARS-CoV-2/FLU/RSV testing.  Fact Sheet for Patients: BloggerCourse.com  Fact Sheet for Healthcare Providers: SeriousBroker.it  This test is not yet approved or cleared by the [place] FDA and has been authorized for detection and/or diagnosis of SARS-CoV-2 by FDA under an Emergency Use Authorization (EUA). This EUA will remain in effect (meaning this test can be used) for the duration of the COVID-19 declaration under Section 564(b)(1) of the Act, 21 U.S.C. section 360bbb-3(b)(1), unless the authorization is terminated or revoked.  Performed at [facility], [address] [place], [place] [address]   Urine Culture     Status: None   Collection Time: 03/17/21  3:00 PM   Specimen: In/Out Cath Urine  Result Value Ref Range Status   Specimen Description   Final    IN/OUT CATH URINE Performed at [facility], [address] [place], [place] [address]    Special Requests   Final    NONE Performed at [facility], [address] [place], [place] [address]    Culture   Final    NO GROWTH Performed at [facility], [address], [place], [place] [address]    Report Status 03/19/2021 FINAL  Final  Culture, blood (routine x 2)     Status: None (Preliminary result)   Collection Time: 03/17/21  3:34 PM   Specimen: BLOOD  Result Value Ref Range Status   Specimen Description BLOOD BLOOD LEFT FOREARM  Final   Special Requests   Final    BOTTLES DRAWN AEROBIC AND ANAEROBIC Blood Culture adequate volume   Culture   Final    NO GROWTH 2 DAYS Performed at [facility], [address] [place], [place] [address]    Report Status PENDING  Incomplete  Culture, blood (routine x 2)     Status: None (Preliminary result)   Collection Time: 03/17/21  3:34 PM   Specimen: BLOOD  Result Value Ref Range Status   Specimen Description BLOOD LEFT ANTECUBITAL  Final   Special Requests   Final    BOTTLES DRAWN AEROBIC AND ANAEROBIC Blood Culture adequate volume   Culture   Final    NO GROWTH 2 DAYS Performed at [facility], [address], [place], [place] [address]    Report Status PENDING  Incomplete  MRSA Next Gen by PCR, Nasal     Status: None   Collection Time: 03/17/21  8:51 PM   Specimen: Nasal Mucosa; Nasal Swab  Result Value Ref Range Status   MRSA by PCR Next Gen NOT DETECTED NOT DETECTED Final    Comment: (NOTE) The GeneXpert MRSA Assay (FDA approved for NASAL specimens only), is one component of a comprehensive MRSA colonization surveillance program. It is not intended to diagnose MRSA infection nor to guide or monitor treatment for MRSA infections. Test performance is not FDA approved in patients less than 2 years old. Performed at [facility], [address] [place], [place] [address]          Radiology Studies: No results found.      Scheduled Meds:  buprenorphine-naloxone  1 tablet Sublingual BID   Chlorhexidine Gluconate Cloth  6 each Topical Q0600   enoxaparin (LOVENOX) injection  40 mg Subcutaneous Q24H   insulin aspart  0-9 Units Subcutaneous Q4H   insulin glargine-yfgn  8 Units Subcutaneous QHS   lipase/protease/amylase  72,000 Units Oral TID AC   nicotine  14 mg Transdermal Daily   pantoprazole (PROTONIX) IV  40 mg Intravenous Q12H   Continuous Infusions:  ceFEPime (MAXIPIME) IV Stopped (03/19/21 1344)   lactated ringers 150 mL/hr at 03/19/21 1500   metronidazole Stopped (03/19/21 0622)    Assessment & Plan:   Principal Problem:   Lactic acidosis Active Problems:   DKA (diabetic ketoacidosis) (HCC)   SIRS  (systemic inflammatory response syndrome) (HCC)   H/O splenectomy   History of pancreatectomy   Hx of cholecystectomy   Hx of bypass gastrojejunostomy   Gastritis   Generalized weakness   Chronic pain  Vomiting   *  DKA (diabetic ketoacidosis) (HCC) Felt poorly yesterday, didn't eat so didn't take meds DKA could precipitate hypovolemia/lactic acidosis, etc (above), but also seems like infectious sx present? CO2 17, AG 19, normal pH on VBG -> seems mild for degree of lactic acidosis  treated with insulin drip on DKA protocol Diabetic educator following Lactic acidosis improved 12/22 in long acting, continue riss       SIRS (systemic inflammatory response syndrome) (HCC) Negative covid/influenza Covering for intraabdominal infection for now, low threshold to broaden Continue IVF CT with gastritis (see report) - stent in small bowel of indeterminate function/position - will discuss with surgery (per discussion with surgery overnight, nothing to do regarding stent, they'll see in AM) CXR without active disease (consider CT chest - though no specific symptoms) 12/22 wbc down. 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

## 2021-03-20 LAB — GLUCOSE, CAPILLARY
Glucose-Capillary: 80 mg/dL (ref 70–99)
Glucose-Capillary: 146 mg/dL — ABNORMAL HIGH (ref 70–99)
Glucose-Capillary: 196 mg/dL — ABNORMAL HIGH (ref 70–99)

## 2021-03-20 MED ORDER — PANTOPRAZOLE SODIUM 40 MG PO TBEC: 40.0000 mg | DELAYED_RELEASE_TABLET | Freq: Two times a day (BID) | ORAL | 0 refills | Status: DC

## 2021-03-20 MED ORDER — ALUM & MAG HYDROXIDE-SIMETH 200-200-20 MG/5ML PO SUSP
30.0000 mL | Freq: Four times a day (QID) | ORAL | Status: DC | PRN
  Administered 2021-03-20: 08:00:00 30 mL via ORAL
  Filled 2021-03-20: qty 30

## 2021-03-20 MED ORDER — INSULIN ASPART 100 UNIT/ML IJ SOLN
3.0000 [IU] | Freq: Three times a day (TID) | INTRAMUSCULAR | Status: DC
  Administered 2021-03-20: 12:00:00 3 [IU] via SUBCUTANEOUS

## 2021-03-20 MED ORDER — AMOXICILLIN-POT CLAVULANATE 875-125 MG PO TABS: 1.0000 | ORAL_TABLET | Freq: Two times a day (BID) | ORAL | 0 refills | Status: AC

## 2021-03-20 MED ORDER — PANTOPRAZOLE SODIUM 40 MG PO TBEC: 40.0000 mg | DELAYED_RELEASE_TABLET | Freq: Two times a day (BID) | ORAL | Status: DC

## 2021-03-20 NOTE — Care Management Important Message (Signed)
Important Message  Patient Details  Name: Ian Gross MRN: [id number] Date of Birth: 05/04/1975   Medicare Important Message Given:  N/A - LOS <3 / Initial given by admissions     Olegario Messier A Aftyn Nott 03/20/2021, 2:06 PM

## 2021-03-20 NOTE — Progress Notes (Signed)
PHARMACIST - PHYSICIAN COMMUNICATION  CONCERNING: IV to Oral Route Change Policy  RECOMMENDATION: This patient is receiving pantoprazole by the intravenous route.  Based on criteria approved by the Pharmacy and Therapeutics Committee, the intravenous medication(s) is/are being converted to the equivalent oral dose form(s).   DESCRIPTION: These criteria include: The patient is eating (either orally or via tube) and/or has been taking other orally administered medications for a least 24 hours The patient has no evidence of active gastrointestinal bleeding or impaired GI absorption (gastrectomy, short bowel, patient on TNA or NPO).  If you have questions about this conversion, please contact the Pharmacy Department   Tressie Ellis, [facility] 03/20/2021 12:10 PM

## 2021-03-20 NOTE — Discharge Summary (Signed)
Ian Gross HYI:[id number] DOB: [birth year] DOA: 03/17/2021  PCP: [facility]  Admit date: 03/17/2021 Discharge date: 03/20/2021  Admitted From: Home Disposition: Home  Recommendations for Outpatient Follow-up:  Follow up with PCP in 1 week Please obtain BMP/CBC in one week      Discharge Condition:Stable CODE STATUS: Full Diet recommendation: Carb modified    Brief/Interim Summary: Per NOM:[id number] Lease is a 45 y.o. 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  12/19 around 3 PM he started feeling poorly.  Developed N/V.  Felt bad all evening.  Just laid in the bathtub for like 4 hrs, barely got any sleep.  Around 6 am, took a bath, threw up again, and laid in the bathtub for about 4 more hours.  He came to the ED due to continued discomfort and weakness.  7-8 episodes of bilious emesis.  No pain, maybe fevers.  +chills, + cold sweats, + cough (related to dry heaving), + sore throat  Patient was found to be in DKA and lactic acidosis.  Admitted to the hospital for further management.  Had a CT of abdomen with results below was found with gastritis.   DKA (diabetic ketoacidosis) (HCC) Started on DKA protocal. Admitted to ICU.  Transition to insulin . He reported he was given new scripts by his endocrinologist couple of days prior to admission.  He did verify with his pharmacy that they have his medication.           SIRS (systemic inflammatory response syndrome) (HCC) Negative covid/influenza Was treated for possible intra-abdominal infection  CT with gastritis (see report)  CXR without active disease   White count improving ,lactic acid resolved   Generalized weakness Due to DKA         Gastritis Continue PPI H pylori stool  ag Noted on CT Was treated with IV antibiotics will transition to p.o. 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, splenectomy and cholecystectomy with hepaticojejunostomy and gastrojejunostomy        Chronic pain suboxone   Discharge Diagnoses:  Principal Problem:   Lactic acidosis Active Problems:   DKA (diabetic ketoacidosis) (HCC)   SIRS (systemic inflammatory response syndrome) (HCC)   H/O splenectomy   History of pancreatectomy   Hx of cholecystectomy   Hx of bypass gastrojejunostomy   Gastritis   Generalized weakness   Chronic pain   Vomiting    Discharge Instructions  Discharge Instructions     Call MD for:  persistant nausea and vomiting   Complete by: As directed    Diet Carb Modified   Complete by: As directed    Discharge instructions   Complete by: As directed    Follow up with endocrinologist in 1 week. Keep hydrated   Increase activity slowly   Complete by: As directed       Allergies as of 03/20/2021   No Known Allergies      Medication List     TAKE these medications    amoxicillin-clavulanate 875-125 MG tablet Commonly known as: Augmentin Take 1 tablet by mouth every 12 (twelve) hours for 3 days.   Buprenorphine HCl-Naloxone HCl 8-2 MG Film Place 1 strip under the tongue 2 (two) times daily.   Creon 24000-76000 units Cpep Generic drug: Pancrelipase (Lip-Prot-Amyl) Take 3  capsules by mouth 3 (three) times daily.   HumaLOG KwikPen 100 UNIT/ML KwikPen Generic drug: insulin lispro Inject 4-9 Units into the skin 3 (three) times daily. Sliding scale (go up by 1 unit)   insulin glargine 100 UNIT/ML injection Commonly known as: LANTUS Inject 10 Units into the skin at bedtime.   pantoprazole 40 MG tablet Commonly known as: PROTONIX Take 1 tablet (40 mg total) by mouth 2 (two) times daily.        Follow-up Information      [facility] Follow up in 1 week(s).   Specialty: General Practice Contact information: [address] [place] [address] [phone]                No Known Allergies  Consultations: Diabetic educator   Procedures/Studies: CT ABDOMEN PELVIS W CONTRAST  Result Date: 03/17/2021 CLINICAL DATA:  Weakness and vomiting. Prior gallbladder stent. History of diabetic ketoacidosis. EXAM: CT ABDOMEN AND PELVIS WITH CONTRAST TECHNIQUE: Multidetector CT imaging of the abdomen and pelvis was performed using the standard protocol following bolus administration of intravenous contrast. CONTRAST:  OMNIPAQUE IOHEXOL 300 MG/ML  SOLN COMPARISON:  Chest radiograph of earlier today. FINDINGS: Lower chest: Clear lung bases. Normal heart size without pericardial or pleural effusion. Hepatobiliary: Hepatic steatosis, without focal liver lesion. Cholecystectomy. Pancreas: The pancreatic body and tail are either markedly atrophic or surgically absent. There may be minimal residual pancreatic head parenchyma. Spleen: Surgically absent Adrenals/Urinary Tract: Normal adrenal glands. Normal kidneys, without hydronephrosis. Normal urinary bladder. Stomach/Bowel: Suspect mild gastric wall and fold thickening, including on 24/2 within the body. Suspect gastro jejunostomy. A catheter/stent is positioned within right upper quadrant small bowel, including on 33/4. This may originate in the region of the remaining pancreatic head. Diffuse colonic underdistention.  No small bowel obstruction. Vascular/Lymphatic: Aortic atherosclerosis. The celiac is diminutive including on 16/2. The SMA is widely patent, as is the portal vein. No abdominopelvic adenopathy. Reproductive: Normal prostate. Other: No significant free fluid.  No free intraperitoneal air. Musculoskeletal: No acute osseous abnormality.  Disc bulge at L4-5. IMPRESSION: 1. Gastric body wall and fold thickening, suspicious  for gastritis. No other acute process identified. 2. Extensive surgical changes, without pertinent clinical history. Suspect at least partial pancreatectomy and gastrojejunostomy (possibly Whipple procedure). A catheter or stent within right abdominal small bowel may originate within the remaining pancreatic head, but is of indeterminate function/position. Question pancreatic duct stent which has passed into the jejunum. 3. Aortic Atherosclerosis (ICD10-I70.0). This is age advanced. Moderate narrowing of the celiac with widely patent SMA. 4. Hepatic steatosis Electronically Signed   By: Jeronimo Greaves M.D.   On: 03/17/2021 16:29   DG Chest Portable 1 View  Result Date: 03/17/2021 CLINICAL DATA:  Kidney a. EXAM: PORTABLE CHEST 1 VIEW COMPARISON:  None FINDINGS: Trachea midline. Cardiomediastinal contours and hilar structures are normal. EKG leads project over the chest. On limited assessment no acute skeletal process. IMPRESSION: No active disease. Electronically Signed   By: Donzetta Kohut M.D.   On: 03/17/2021 13:18      Subjective: Feels better.  No more GI symptoms.  Discharge Exam: Vitals:   03/19/21 1944 03/20/21 0730  BP: (!) 123/57 131/60  Pulse: (!) 54 (!) 50  Resp: 18 16  Temp: 98 F (36.7 C) 98.7 F (37.1 C)  SpO2: 100% 95%   Vitals:   03/19/21 1204 03/19/21 1619 03/19/21 1944 03/20/21 0730  BP: (!) 144/59 (!) 143/59 (!) 123/57 131/60  Pulse: 86 (!) 58 (!) 54 (!) 50  Resp:  14 18 16   Temp:  98.4 F (36.9 C) 98 F (36.7 C) 98.7 F (37.1 C)  TempSrc:  Oral Oral Oral  SpO2: 98% 100% 100% 95%  Weight:      Height:        General: Pt is alert, awake, not in acute distress Cardiovascular: RRR, S1/S2 +, no rubs, no gallops Respiratory: CTA bilaterally, no wheezing, no rhonchi Abdominal: Soft, NT, ND, bowel sounds + Extremities: no edema, no cyanosis    The results of significant diagnostics from this hospitalization (including imaging, microbiology, ancillary and  laboratory) are listed below for reference.     Microbiology: Recent Results (from the past 240 hour(s))  Resp Panel by RT-PCR (Flu A&B, Covid)     Status: None   Collection Time: 03/17/21 12:43 PM   Specimen: Nasopharyngeal(NP) swabs in vial transport medium  Result Value Ref Range Status   SARS Coronavirus 2 by RT PCR NEGATIVE NEGATIVE Final    Comment: (NOTE) SARS-CoV-2 target nucleic acids are NOT DETECTED.  The SARS-CoV-2 RNA is generally detectable in upper respiratory specimens during the acute phase of infection. The lowest concentration of SARS-CoV-2 viral copies this assay can detect is 138 copies/mL. A negative result does not preclude SARS-Cov-2 infection and should not be used as the sole basis for treatment or other patient management decisions. A negative result may occur with  improper specimen collection/handling, submission of specimen other than nasopharyngeal swab, presence of viral mutation(s) within the areas targeted by this assay, and inadequate number of viral copies(<138 copies/mL). A negative result must be combined with clinical observations, patient history, and epidemiological information. The expected result is Negative.  Fact Sheet for Patients:  03/19/21  Fact Sheet for Healthcare Providers:  BloggerCourse.com  This test is no t yet approved or cleared by the [place] FDA and  has been authorized for detection and/or diagnosis of SARS-CoV-2 by FDA under an Emergency Use Authorization (EUA). This EUA will remain  in effect (meaning this test can be used) for the duration of the COVID-19 declaration under Section 564(b)(1) of the Act, 21 U.S.C.section 360bbb-3(b)(1), unless the authorization is terminated  or revoked sooner.       Influenza A by PCR NEGATIVE NEGATIVE Final   Influenza B by PCR NEGATIVE NEGATIVE Final    Comment: (NOTE) The Xpert Xpress SARS-CoV-2/FLU/RSV plus  assay is intended as an aid in the diagnosis of influenza from Nasopharyngeal swab specimens and should not be used as a sole basis for treatment. Nasal washings and aspirates are unacceptable for Xpert Xpress SARS-CoV-2/FLU/RSV testing.  Fact Sheet for Patients: [place]  Fact Sheet for Healthcare Providers: BloggerCourse.com  This test is not yet approved or cleared by the [place] FDA and has been authorized for detection and/or diagnosis of SARS-CoV-2 by FDA under an Emergency Use Authorization (EUA). This EUA will remain in effect (meaning this test can be used) for the duration of the COVID-19 declaration under Section 564(b)(1) of the Act, 21 U.S.C. section 360bbb-3(b)(1), unless the authorization is terminated or revoked.  Performed at [facility], [address] [place], [place]   Urine Culture     Status: None   Collection Time: 03/17/21  3:00 PM   Specimen: In/Out Cath Urine  Result Value Ref Range Status   Specimen Description   Final    IN/OUT CATH URINE Performed at [facility], [address], [place], [place]  [address]    Special Requests   Final    NONE Performed at [facility], [address] [place], [place] [address]    Culture   Final    NO GROWTH Performed at [facility], [address] [place], [place] [address]    Report Status 03/19/2021 FINAL  Final  Culture, blood (routine x 2)     Status: None (Preliminary result)   Collection Time: 03/17/21  3:34 PM   Specimen: BLOOD  Result Value Ref Range Status   Specimen Description BLOOD BLOOD LEFT FOREARM  Final   Special Requests   Final    BOTTLES DRAWN AEROBIC AND ANAEROBIC Blood Culture adequate volume   Culture   Final    NO GROWTH 3 DAYS Performed at [facility], [address] [place], [place] [address]    Report Status PENDING  Incomplete  Culture, blood (routine x  2)     Status: None (Preliminary result)   Collection Time: 03/17/21  3:34 PM   Specimen: BLOOD  Result Value Ref Range Status   Specimen Description BLOOD LEFT ANTECUBITAL  Final   Special Requests   Final    BOTTLES DRAWN AEROBIC AND ANAEROBIC Blood Culture adequate volume   Culture   Final    NO GROWTH 3 DAYS Performed at [facility], [address], [place], [place] [address]    Report Status PENDING  Incomplete  MRSA Next Gen by PCR, Nasal     Status: None   Collection Time: 03/17/21  8:51 PM   Specimen: Nasal Mucosa; Nasal Swab  Result Value Ref Range Status   MRSA by PCR Next Gen NOT DETECTED NOT DETECTED Final    Comment: (NOTE) The GeneXpert MRSA Assay (FDA approved for NASAL specimens only), is one component of a comprehensive MRSA colonization surveillance program. It is not intended to diagnose MRSA infection nor to guide or monitor treatment for MRSA infections. Test performance is not FDA approved in patients less than 45 years old. Performed at [facility], [address] [place], [place] [address]      Labs: BNP (last 3 results) No results for input(s): BNP in the last 8760 hours. Basic Metabolic Panel: Recent Labs  Lab 03/17/21 1243 03/17/21 1736 03/17/21 2135 03/18/21 0405 03/19/21 0426  NA 136 136 138 136 135  K 4.5 4.3 4.1 4.0 3.8  CL 100 106 107 108 104  CO2 17* 20* GLUCOSE 377* 360* 160* 213* 106*  BUN CREATININE 1.27* 1.25* 1.01 0.96 0.82  CALCIUM 9.2 8.0* 8.0* 7.8* 7.9*   Liver Function Tests: Recent Labs  Lab 03/17/21 1243 03/18/21 0405  AST 61* 24  ALT 30 22  ALKPHOS 177* 121  BILITOT 1.0 0.6  PROT 6.6 4.6*  ALBUMIN 3.7 2.5*   No results for input(s): LIPASE, AMYLASE in the last 168 hours. No results for input(s): AMMONIA in the last 168 hours. CBC: Recent Labs  Lab 03/17/21 1243 03/18/21 0405 03/19/21 0426  WBC 14.2* 21.5* 11.7*  NEUTROABS 8.5*  --   --   HGB 14.5 11.0*  10.6*  HCT 43.7 32.1* 31.2*  MCV 100.2* 95.8 96.0  PLT 339 268 247   Cardiac Enzymes: No results for input(s): CKTOTAL, CKMB, CKMBINDEX, TROPONINI in the last 168 hours. BNP: Invalid input(s): POCBNP CBG: Recent Labs  Lab 03/19/21 2111 03/19/21 2329 03/20/21 0519 03/20/21 0728 03/20/21 1134  GLUCAP 233* 156* 80  146* 196*   D-Dimer No results for input(s): DDIMER in the last 72 hours. Hgb A1c Recent Labs    03/17/21 1736  HGBA1C 7.9*   Lipid Profile No results for input(s): CHOL, HDL, LDLCALC, TRIG, CHOLHDL, LDLDIRECT in the last 72 hours. Thyroid function studies No results for input(s): TSH, T4TOTAL, T3FREE, THYROIDAB in the last 72 hours.  Invalid input(s): FREET3 Anemia work up No results for input(s): VITAMINB12, FOLATE, FERRITIN, TIBC, IRON, RETICCTPCT in the last 72 hours. Urinalysis    Component Value Date/Time   COLORURINE YELLOW (A) 03/17/2021 1500   APPEARANCEUR CLEAR (A) 03/17/2021 1500   LABSPEC 1.023 03/17/2021 1500   PHURINE 5.0 03/17/2021 1500   GLUCOSEU >=500 (A) 03/17/2021 1500   HGBUR NEGATIVE 03/17/2021 1500   BILIRUBINUR NEGATIVE 03/17/2021 1500   KETONESUR 5 (A) 03/17/2021 1500   PROTEINUR NEGATIVE 03/17/2021 1500   NITRITE NEGATIVE 03/17/2021 1500   LEUKOCYTESUR NEGATIVE 03/17/2021 1500   Sepsis Labs Invalid input(s): PROCALCITONIN,  WBC,  LACTICIDVEN Microbiology Recent Results (from the past 240 hour(s))  Resp Panel by RT-PCR (Flu A&B, Covid)     Status: None   Collection Time: 03/17/21 12:43 PM   Specimen: Nasopharyngeal(NP) swabs in vial transport medium  Result Value Ref Range Status   SARS Coronavirus 2 by RT PCR NEGATIVE NEGATIVE Final    Comment: (NOTE) SARS-CoV-2 target nucleic acids are NOT DETECTED.  The SARS-CoV-2 RNA is generally detectable in upper respiratory specimens during the acute phase of infection. The lowest concentration of SARS-CoV-2 viral copies this assay can detect is 138 copies/mL. A negative result  does not preclude SARS-Cov-2 infection and should not be used as the sole basis for treatment or other patient management decisions. A negative result may occur with  improper specimen collection/handling, submission of specimen other than nasopharyngeal swab, presence of viral mutation(s) within the areas targeted by this assay, and inadequate number of viral copies(<138 copies/mL). A negative result must be combined with clinical observations, patient history, and epidemiological information. The expected result is Negative.  Fact Sheet for Patients:  BloggerCourse.com  Fact Sheet for Healthcare Providers:  [employer]iousBroker.it  This test is no t yet approved or cleared by the [place] FDA and  has been authorized for detection and/or diagnosis of SARS-CoV-2 by FDA under an Emergency Use Authorization (EUA). This EUA will remain  in effect (meaning this test can be used) for the duration of the COVID-19 declaration under Section 564(b)(1) of the Act, 21 U.S.C.section 360bbb-3(b)(1), unless the authorization is terminated  or revoked sooner.       Influenza A by PCR NEGATIVE NEGATIVE Final   Influenza B by PCR NEGATIVE NEGATIVE Final    Comment: (NOTE) The Xpert Xpress SARS-CoV-2/FLU/RSV plus assay is intended as an aid in the diagnosis of influenza from Nasopharyngeal swab specimens and should not be used as a sole basis for treatment. Nasal washings and aspirates are unacceptable for Xpert Xpress SARS-CoV-2/FLU/RSV testing.  Fact Sheet for Patients: BloggerCourse.com  Fact Sheet for Healthcare Providers: [employer]iousBroker.it  This test is not yet approved or cleared by the [place] FDA and has been authorized for detection and/or diagnosis of SARS-CoV-2 by FDA under an Emergency Use Authorization (EUA). This EUA will remain in effect (meaning this test can be used) for  the duration of the COVID-19 declaration under Section 564(b)(1) of the Act, 21 U.S.C. section 360bbb-3(b)(1), unless the authorization is terminated or revoked.  Performed at [facility], [address], [place], [place] [address]  Urine Culture     Status: None   Collection Time: 03/17/21  3:00 PM   Specimen: In/Out Cath Urine  Result Value Ref Range Status   Specimen Description   Final    IN/OUT CATH URINE Performed at [facility], [address], [place], [place] [address]    Special Requests   Final    NONE Performed at [facility], [address], [place], [place] [address]    Culture   Final    NO GROWTH Performed at [facility], [address], [place], [place] [address]    Report Status 03/19/2021 FINAL  Final  Culture, blood (routine x 2)     Status: None (Preliminary result)   Collection Time: 03/17/21  3:34 PM   Specimen: BLOOD  Result Value Ref Range Status   Specimen Description BLOOD BLOOD LEFT FOREARM  Final   Special Requests   Final    BOTTLES DRAWN AEROBIC AND ANAEROBIC Blood Culture adequate volume   Culture   Final    NO GROWTH 3 DAYS Performed at [facility], [address], [place], [place] [address]    Report Status PENDING  Incomplete  Culture, blood (routine x 2)     Status: None (Preliminary result)   Collection Time: 03/17/21  3:34 PM   Specimen: BLOOD  Result Value Ref Range Status   Specimen Description BLOOD LEFT ANTECUBITAL  Final   Special Requests   Final    BOTTLES DRAWN AEROBIC AND ANAEROBIC Blood Culture adequate volume   Culture   Final    NO GROWTH 3 DAYS Performed at [facility], [address], [place], [place] [address]    Report Status PENDING  Incomplete  MRSA Next Gen by PCR, Nasal     Status: None   Collection Time: 03/17/21  8:51 PM   Specimen: Nasal Mucosa; Nasal Swab  Result Value Ref Range Status   MRSA by PCR Next Gen NOT DETECTED NOT DETECTED Final     Comment: (NOTE) The GeneXpert MRSA Assay (FDA approved for NASAL specimens only), is one component of a comprehensive MRSA colonization surveillance program. It is not intended to diagnose MRSA infection nor to guide or monitor treatment for MRSA infections. Test performance is not FDA approved in patients less than 32 years old. Performed at [facility], [address] [place], [place] [address]      Time coordinating discharge: Over 30 minutes  SIGNED:   Lynn Ito, MD  [facility] 03/20/2021, 1:33 PM Pager   If 7PM-7AM, please contact night-coverage [email or web address] Password [email or web address]

## 2021-03-22 LAB — CULTURE, BLOOD (ROUTINE X 2)
Culture: NO GROWTH
Culture: NO GROWTH
Special Requests: ADEQUATE
Special Requests: ADEQUATE

## 2021-03-27 LAB — URINE DRUGS OF ABUSE SCREEN W ALC, ROUTINE (REF LAB)
Amphetamines, Urine: NEGATIVE ng/mL
Barbiturate, Ur: NEGATIVE ng/mL
Benzodiazepine Quant, Ur: NEGATIVE ng/mL
Cocaine (Metab.): NEGATIVE ng/mL
Ethanol U, Quan: NEGATIVE %
Methadone Screen, Urine: NEGATIVE ng/mL
Opiate Quant, Ur: NEGATIVE ng/mL
Phencyclidine, Ur: NEGATIVE ng/mL
Propoxyphene, Urine: NEGATIVE ng/mL

## 2021-03-27 LAB — PANEL 799049
CARBOXY THC GC/MS CONF: 18 ng/mL
Cannabinoid GC/MS, Ur: POSITIVE — AB

## 2021-06-02 ENCOUNTER — Other Ambulatory Visit: Payer: Self-pay

## 2021-06-02 ENCOUNTER — Ambulatory Visit (INDEPENDENT_AMBULATORY_CARE_PROVIDER_SITE_OTHER): Payer: Medicare Other | Admitting: Internal Medicine

## 2021-06-02 ENCOUNTER — Encounter: Payer: Self-pay | Admitting: Internal Medicine

## 2021-06-02 VITALS — BP 129/63 | HR 64 | Temp 97.7°F | Ht 72.0 in | Wt 130.0 lb

## 2021-06-02 DIAGNOSIS — E1165 Type 2 diabetes mellitus with hyperglycemia: Secondary | ICD-10-CM | POA: Diagnosis not present

## 2021-06-02 DIAGNOSIS — R636 Underweight: Secondary | ICD-10-CM

## 2021-06-02 DIAGNOSIS — Z9081 Acquired absence of spleen: Secondary | ICD-10-CM

## 2021-06-02 DIAGNOSIS — L0232 Furuncle of buttock: Secondary | ICD-10-CM

## 2021-06-02 DIAGNOSIS — Z1211 Encounter for screening for malignant neoplasm of colon: Secondary | ICD-10-CM

## 2021-06-02 DIAGNOSIS — E119 Type 2 diabetes mellitus without complications: Secondary | ICD-10-CM | POA: Insufficient documentation

## 2021-06-02 DIAGNOSIS — G894 Chronic pain syndrome: Secondary | ICD-10-CM

## 2021-06-02 DIAGNOSIS — Z794 Long term (current) use of insulin: Secondary | ICD-10-CM

## 2021-06-02 DIAGNOSIS — Z90410 Acquired total absence of pancreas: Secondary | ICD-10-CM

## 2021-06-02 MED ORDER — DOXYCYCLINE HYCLATE 100 MG PO TABS: 100.0000 mg | ORAL_TABLET | Freq: Two times a day (BID) | ORAL | 0 refills | Status: DC

## 2021-06-02 NOTE — Assessment & Plan Note (Signed)
Encourage high-calorie diet and supplement with protein shakes ?

## 2021-06-02 NOTE — Patient Instructions (Signed)
Skin Abscess ?A skin abscess is an infected area of your skin that contains pus and other material. An abscess can happen in any part of your body. Some abscesses break open (rupture) on their own. Most continue to get worse unless they are treated. The infection can spread deeper into the body and into your blood, which can make you feel sick. ?A skin abscess is caused by germs that enter the skin through a cut or scrape. It can also be caused by blocked oil and sweat glands or infected hair follicles. ?This condition is usually treated by: ?Draining the pus. ?Taking antibiotic medicines. ?Placing a warm, wet washcloth over the abscess. ?Follow these instructions at home: ?Medicines ? ?Take over-the-counter and prescription medicines only as told by your doctor. ?If you were prescribed an antibiotic medicine, take it as told by your doctor. Do not stop taking the antibiotic even if you start to feel better. ?Abscess care ? ?If you have an abscess that has not drained, place a warm, clean, wet washcloth over the abscess several times a day. Do this as told by your doctor. ?Follow instructions from your doctor about how to take care of your abscess. Make sure you: ?Cover the abscess with a bandage (dressing). ?Change your bandage or gauze as told by your doctor. ?Wash your hands with soap and water before you change the bandage or gauze. If you cannot use soap and water, use hand sanitizer. ?Check your abscess every day for signs that the infection is getting worse. Check for: ?More redness, swelling, or pain. ?More fluid or blood. ?Warmth. ?More pus or a bad smell. ?General instructions ?To avoid spreading the infection: ?Do not share personal care items, towels, or hot tubs with others. ?Avoid making skin-to-skin contact with other people. ?Keep all follow-up visits as told by your doctor. This is important. ?Contact a doctor if: ?You have more redness, swelling, or pain around your abscess. ?You have more fluid or  blood coming from your abscess. ?Your abscess feels warm when you touch it. ?You have more pus or a bad smell coming from your abscess. ?You have a fever. ?Your muscles ache. ?You have chills. ?You feel sick. ?Get help right away if: ?You have very bad (severe) pain. ?You see red streaks on your skin spreading away from the abscess. ?Summary ?A skin abscess is an infected area of your skin that contains pus and other material. ?The abscess is caused by germs that enter the skin through a cut or scrape. It can also be caused by blocked oil and sweat glands or infected hair follicles. ?Follow your doctor's instructions on caring for your abscess, taking medicines, preventing infections, and keeping follow-up visits. ?This information is not intended to replace advice given to you by your health care provider. Make sure you discuss any questions you have with your health care provider. ?Document Revised: 10/19/2018 Document Reviewed: 04/28/2017 ?Elsevier Patient Education ? 2022 Elsevier Inc. ? ?

## 2021-06-02 NOTE — Assessment & Plan Note (Signed)
We will monitor 

## 2021-06-02 NOTE — Assessment & Plan Note (Signed)
He will continue Creon ?He will continue to follow with endocrinology ?

## 2021-06-02 NOTE — Assessment & Plan Note (Signed)
Follows with the Suboxone clinic ?

## 2021-06-02 NOTE — Assessment & Plan Note (Signed)
Too soon for A1c ?We will check urine microalbumin at next visit ?Continue Lantus and Humalog as prescribed ?Encouraged him to consume a low-carb diet ?Advised him to schedule an appointment for an eye exam ?Encourage routine foot exam ?He declines flu and COVID vaccines ?He reports his Pneumovax is UTD but he cannot remember when ?He will continue to follow with endocrinology ?

## 2021-06-02 NOTE — Progress Notes (Signed)
HPI ? ?Pt presents to the clinic today to establish care and for management of the conditions listed below. He has not had a PCP in many years. ? ?DM 2: His last A1C was 7.9 02/2021. He is taking Lantaus and Humalog as prescribed. His checks his sugars intermittently. He checks his feet intermittently. His last eye exam was 2 years ago. Flu never. Pneumovax unsure. Covid never. ? 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 He follows with the Suboxone clinic.  ? 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 He follows with [facility]. ? ?He also reports recurrent boils. He has a boil on his buttocks x 2 weeks. He reports it has drained drainage. He denies fever, chills, nausea, vomiting.  ? ?Past Medical History:  ?Diagnosis Date  ? Diabetes mellitus without complication ([place])   ? ? ?Current Outpatient Medications  ?Medication Sig Dispense Refill  ? Buprenorphine HCl-Naloxone HCl 8-2 MG FILM Place 1 strip under the tongue 2 (two) times daily.    ? HUMALOG KWIKPEN 100 UNIT/ML KwikPen Inject 4-9 Units into the skin 3 (three) times daily. Sliding scale (go up by 1 unit)    ? insulin glargine (LANTUS) 100 UNIT/ML injection Inject 10 Units into the skin at bedtime.    ? Pancrelipase, Lip-Prot-Amyl, (CREON) 24000-76000 units CPEP Take 3 capsules by mouth 3 (three) times daily.    ? pantoprazole (PROTONIX) 40 MG tablet Take 1 tablet (40 mg total) by mouth 2 (two) times daily. 60 tablet 0  ? ?No current facility-administered medications for this visit.  ? ? ?No Known Allergies ? ?Family History  ?Problem Relation Age of Onset  ? Lung cancer Father   ? Healthy Sister   ? Healthy Brother   ? CVA Maternal Grandmother   ? Liver cancer Maternal Grandfather   ? Alcohol abuse Maternal Grandfather   ? ? ?Social History  ? ?Socioeconomic History  ? Marital status: Single  ?  Spouse name: Not on file  ? Number of children: Not on file  ? Years of education: Not on file  ? Highest education level:  Not on file  ?Occupational History  ? Not on file  ?Tobacco Use  ? Smoking status: Every Day  ?  Packs/day: 0.75  ?  Types: Cigarettes  ? Smokeless tobacco: Never  ?Vaping Use  ? Vaping Use: Never used  ?Substance and Sexual Activity  ? Alcohol use: Not Currently  ? Drug use: Not Currently  ? Sexual activity: Not on file  ?Other Topics Concern  ? Not on file  ?Social History Narrative  ? Not on file  ? ?Social Determinants of Health  ? ?Financial Resource Strain: Not on file  ?Food Insecurity: Not on file  ?Transportation Needs: Not on file  ?Physical Activity: Not on file  ?Stress: Not on file  ?Social Connections: Not on file  ?Intimate Partner Violence: Not on file  ? ? ?ROS: ? ?Constitutional: Denies fever, malaise, fatigue, headache or abrupt weight changes.  ?HEENT: Denies eye pain, eye redness, ear pain, ringing in the ears, wax buildup, runny nose, nasal congestion, bloody nose, or sore throat. ?Respiratory: Denies difficulty breathing, shortness of breath, cough or sputum production.   ?Cardiovascular: Denies chest pain, chest tightness, palpitations or swelling in the hands or feet.  ?Gastrointestinal: Pt reports chronic abdominal pain. Denies bloating, constipation, diarrhea or blood in the stool.  ?GU: Denies frequency, urgency, pain with urination, blood in urine, odor or discharge. ?Musculoskeletal: Denies decrease in range  of motion, difficulty with gait, or joint swelling.  ?Skin: Pt reports boil of buttocks. Denies redness, rashes, or ulcercations.  ?Neurological: Denies dizziness, difficulty with memory, difficulty with speech or problems with balance and coordination.  ?Psych: Denies anxiety, depression, SI/HI. ? ?No other specific complaints in a complete review of systems (except as listed in HPI above). ? ?PE: ? ?BP 129/63 (BP Location: Left Arm, Patient Position: Sitting, Cuff Size: Normal)   Pulse 64   Temp 97.7 ?F (36.5 ?C) (Temporal)   Ht 6' (1.829 m)   Wt 130 lb (59 kg)   SpO2 100%    BMI 17.63 kg/m?  ?Wt Readings from Last 3 Encounters:  ?06/02/21 130 lb (59 kg)  ?03/17/21 123 lb 14.4 oz (56.2 kg)  ?07/30/19 130 lb (59 kg)  ? ? ?General: Appears his stated age, underweight in NAD. ?Skin: 1 cm abscess without cellulitis noted right gluteal fold.  No ulcerations noted. ?HEENT: Head: normal shape and size; Eyes: sclera white and EOMs intact;  ?Cardiovascular: Normal rate and rhythm. S1,S2 noted.  No murmur, rubs or gallops noted. No JVD or BLE edema.  ?Pulmonary/Chest: Normal effort and positive vesicular breath sounds. No respiratory distress. No wheezes, rales or ronchi noted.  ?Abdomen: Soft and nontender. Normal bowel sounds. ?Musculoskeletal: No difficulty with gait.  ?Neurological: Alert and oriented. Cranial nerves II-XII grossly intact. Coordination normal.  ?Psychiatric: Mood and affect normal. Behavior is normal. Judgment and thought content normal.  ? ? ?BMET ?   ?Component Value Date/Time  ? NA 135 03/19/2021 0426  ? K 3.8 03/19/2021 0426  ? CL 104 03/19/2021 0426  ? CO2 27 03/19/2021 0426  ? GLUCOSE 106 (H) 03/19/2021 0426  ? BUN 18 03/19/2021 0426  ? CREATININE 0.82 03/19/2021 0426  ? CALCIUM 7.9 (L) 03/19/2021 0426  ? GFRNONAA >60 03/19/2021 0426  ? GFRAA >60 08/01/2019 0509  ? ? ?Lipid Panel  ?No results found for: CHOL, TRIG, HDL, CHOLHDL, VLDL, LDLCALC ? ?CBC ?   ?Component Value Date/Time  ? WBC 11.7 (H) 03/19/2021 0426  ? RBC 3.25 (L) 03/19/2021 0426  ? HGB 10.6 (L) 03/19/2021 0426  ? HCT 31.2 (L) 03/19/2021 0426  ? PLT 247 03/19/2021 0426  ? MCV 96.0 03/19/2021 0426  ? MCH 32.6 03/19/2021 0426  ? MCHC 34.0 03/19/2021 0426  ? RDW 14.1 03/19/2021 0426  ? LYMPHSABS 3.9 03/17/2021 1243  ? MONOABS 0.9 03/17/2021 1243  ? EOSABS 0.1 03/17/2021 1243  ? BASOSABS 0.2 (H) 03/17/2021 1243  ? ? ?Hgb A1C ?Lab Results  ?Component Value Date  ? HGBA1C 7.9 (H) 03/17/2021  ? ? ? ?Assessment and Plan: ? ?Boil of Buttock: ? ?Encourage warm compresses 3 times daily ?Rx for Doxycycline 100 mg 2  times daily for 10 days ? ?RTC in 6 months for your annual exam ?Webb Silversmith, NP ?This visit occurred during the SARS-CoV-2 public health emergency.  Safety protocols were in place, including screening questions prior to the visit, additional usage of staff PPE, and extensive cleaning of exam room while observing appropriate contact time as indicated for disinfecting solutions.  ? ?

## 2021-06-03 ENCOUNTER — Telehealth: Payer: Self-pay

## 2021-06-03 NOTE — Telephone Encounter (Signed)
CALLED PATIENT NO ANSWER LEFT VOICEMAIL FOR A CALL BACK ? ?

## 2021-06-04 ENCOUNTER — Telehealth: Payer: Self-pay

## 2021-06-04 NOTE — Telephone Encounter (Signed)
CALLED PATIENT NO ANSWER LEFT VOICEMAIL FOR A CALL BACK °Letter sent °

## 2021-09-02 NOTE — Patient Instructions (Signed)
Health Maintenance, Male Adopting a healthy lifestyle and getting preventive care are important in promoting health and wellness. Ask your health care provider about: The right schedule for you to have regular tests and exams. Things you can do on your own to prevent diseases and keep yourself healthy. What should I know about diet, weight, and exercise? Eat a healthy diet  Eat a diet that includes plenty of vegetables, fruits, low-fat dairy products, and lean protein. Do not eat a lot of foods that are high in solid fats, added sugars, or sodium. Maintain a healthy weight Body mass index (BMI) is a measurement that can be used to identify possible weight problems. It estimates body fat based on height and weight. Your health care provider can help determine your BMI and help you achieve or maintain a healthy weight. Get regular exercise Get regular exercise. This is one of the most important things you can do for your health. Most adults should: Exercise for at least 150 minutes each week. The exercise should increase your heart rate and make you sweat (moderate-intensity exercise). Do strengthening exercises at least twice a week. This is in addition to the moderate-intensity exercise. Spend less time sitting. Even light physical activity can be beneficial. Watch cholesterol and blood lipids Have your blood tested for lipids and cholesterol at 48 years of age, then have this test every 5 years. You may need to have your cholesterol levels checked more often if: Your lipid or cholesterol levels are high. You are older than 48 years of age. You are at high risk for heart disease. What should I know about cancer screening? Many types of cancers can be detected early and may often be prevented. Depending on your health history and family history, you may need to have cancer screening at various ages. This may include screening for: Colorectal cancer. Prostate cancer. Skin cancer. Lung  cancer. What should I know about heart disease, diabetes, and high blood pressure? Blood pressure and heart disease High blood pressure causes heart disease and increases the risk of stroke. This is more likely to develop in people who have high blood pressure readings or are overweight. Talk with your health care provider about your target blood pressure readings. Have your blood pressure checked: Every 3-5 years if you are 18-39 years of age. Every year if you are 40 years old or older. If you are between the ages of 65 and 75 and are a current or former smoker, ask your health care provider if you should have a one-time screening for abdominal aortic aneurysm (AAA). Diabetes Have regular diabetes screenings. This checks your fasting blood sugar level. Have the screening done: Once every three years after age 45 if you are at a normal weight and have a low risk for diabetes. More often and at a younger age if you are overweight or have a high risk for diabetes. What should I know about preventing infection? Hepatitis B If you have a higher risk for hepatitis B, you should be screened for this virus. Talk with your health care provider to find out if you are at risk for hepatitis B infection. Hepatitis C Blood testing is recommended for: Everyone born from 1945 through 1965. Anyone with known risk factors for hepatitis C. Sexually transmitted infections (STIs) You should be screened each year for STIs, including gonorrhea and chlamydia, if: You are sexually active and are younger than 48 years of age. You are older than 48 years of age and your   health care provider tells you that you are at risk for this type of infection. Your sexual activity has changed since you were last screened, and you are at increased risk for chlamydia or gonorrhea. Ask your health care provider if you are at risk. Ask your health care provider about whether you are at high risk for HIV. Your health care provider  may recommend a prescription medicine to help prevent HIV infection. If you choose to take medicine to prevent HIV, you should first get tested for HIV. You should then be tested every 3 months for as long as you are taking the medicine. Follow these instructions at home: Alcohol use Do not drink alcohol if your health care provider tells you not to drink. If you drink alcohol: Limit how much you have to 0-2 drinks a day. Know how much alcohol is in your drink. In the [place] one drink equals one 12 oz bottle of beer (355 mL), one 5 oz glass of wine (148 mL), or one 1 oz glass of hard liquor (44 mL). Lifestyle Do not use any products that contain nicotine or tobacco. These products include cigarettes, chewing tobacco, and vaping devices, such as e-cigarettes. If you need help quitting, ask your health care provider. Do not use street drugs. Do not share needles. Ask your health care provider for help if you need support or information about quitting drugs. General instructions Schedule regular health, dental, and eye exams. Stay current with your vaccines. Tell your health care provider if: You often feel depressed. You have ever been abused or do not feel safe at home. Summary Adopting a healthy lifestyle and getting preventive care are important in promoting health and wellness. Follow your health care provider's instructions about healthy diet, exercising, and getting tested or screened for diseases. Follow your health care provider's instructions on monitoring your cholesterol and blood pressure. This information is not intended to replace advice given to you by your health care provider. Make sure you discuss any questions you have with your health care provider. Document Revised: 08/04/2020 Document Reviewed: 08/04/2020 [employer] Patient Education  2023 [employer]  

## 2021-09-02 NOTE — Progress Notes (Signed)
Subjective:    I connected with  [name] on 09/03/2021 by a audio enabled telemedicine application and verified that I am speaking with the correct person using two identifiers.  Patient Location: Home  Provider Location: Office/Clinic  I discussed the limitations of evaluation and management by telemedicine. The patient expressed understanding and agreed to proceed.     Ian Gross is a 46 y.o. male who presents for Medicare Annual/Subsequent preventive examination.  Review of Systems    Per HPI unless specifically indicated below       Objective:    Today's Vitals   09/03/21 0910  PainSc: 3    There is no height or weight on file to calculate BMI.     06/02/2021    1:52 PM 03/20/2021    7:30 AM 03/19/2021    7:44 PM  Vitals with BMI  Height 6\' 0"     Weight 130 lbs    BMI 17.63    Systolic 129 131  Diastolic 63 60 57  Pulse 64 50 54        07/31/2019   11:00 AM 07/30/2019   10:39 PM  Advanced Directives  Does Patient Have a Medical Advance Directive? No No  Would patient like information on creating a medical advance directive? No - Patient declined     Current Medications (verified) Outpatient Encounter Medications as of 09/03/2021  Medication Sig   Buprenorphine HCl-Naloxone HCl 8-2 MG FILM Place 1 strip under the tongue 2 (two) times daily.   HUMALOG KWIKPEN 100 UNIT/ML KwikPen Inject 4-9 Units into the skin 3 (three) times daily. Sliding scale (go up by 1 unit)   insulin glargine (LANTUS) 100 UNIT/ML injection Inject 8 Units into the skin at bedtime.   Pancrelipase, Lip-Prot-Amyl, (CREON) 24000-76000 units CPEP Take 3 capsules by mouth 3 (three) times daily.   [DISCONTINUED] doxycycline (VIBRA-TABS) 100 MG tablet Take 1 tablet (100 mg total) by mouth 2 (two) times daily. (Patient not taking: Reported on 09/03/2021)   No facility-administered encounter medications on file as of 09/03/2021.    Allergies (verified) Patient has no known  allergies.   History: Past Medical History:  Diagnosis Date   Diabetes mellitus without complication (HCC)    Past Surgical History:  Procedure Laterality Date   PANCREATECTOMY N/A    SPLENECTOMY     Family History  Problem Relation Age of Onset   Lung cancer Father    Healthy Sister    Healthy Brother    CVA Maternal Grandmother    Liver cancer Maternal Grandfather    Alcohol abuse Maternal Grandfather    Social History   Socioeconomic History   Marital status: Significant Other    Spouse name: Not on file   Number of children: 2   Years of education: Not on file   Highest education level: Not on file  Occupational History   Occupation: Disable  Tobacco Use   Smoking status: Every Day    Packs/day: 0.75    Types: Cigarettes   Smokeless tobacco: Never  Vaping Use   Vaping Use: Never used  Substance and Sexual Activity   Alcohol use: Not Currently   Drug use: Not Currently   Sexual activity: Not on file  Other Topics Concern   Not on file  Social History Narrative   Not on file   Social Determinants of Health   Financial Resource Strain: Low Risk  (09/03/2021)   Overall Financial Resource Strain (CARDIA)    Difficulty of  Paying Living Expenses: Not hard at all  Food Insecurity: No Food Insecurity (09/03/2021)   Hunger Vital Sign    Worried About Running Out of Food in the Last Year: Never true    Ran Out of Food in the Last Year: Never true  Transportation Needs: No Transportation Needs (09/03/2021)   PRAPARE - Administrator, Civil Service (Medical): No    Lack of Transportation (Non-Medical): No  Physical Activity: Not on file  Stress: No Stress Concern Present (09/03/2021)   [employer] of Occupational Health - Occupational Stress Questionnaire    Feeling of Stress : Only a little  Social Connections: Moderately Isolated (09/03/2021)   Social Connection and Isolation Panel [NHANES]    Frequency of Communication with Friends and Family: Twice  a week    Frequency of Social Gatherings with Friends and Family: More than three times a week    Attends Religious Services: Never    Database administrator or Organizations: No    Attends Engineer, structural: Not on file    Marital Status: Living with partner    Tobacco Counseling Ready to quit: Not Answered Counseling given: Not Answered   Clinical Intake:  Pre-visit preparation completed: No  Pain : 0-10 Pain Score: 3  Pain Type: Chronic pain Pain Location: Abdomen Pain Orientation: Left Pain Descriptors / Indicators: Aching Pain Onset: More than a month ago Pain Frequency: Occasional     Nutritional Status: BMI of 19-24  Normal Nutritional Risks: Nausea/ vomitting/ diarrhea Diabetes: Yes CBG done?: Yes Did pt. bring in CBG monitor from home?: No Glucose Meter Downloaded?: No  How often do you need to have someone help you when you read instructions, pamphlets, or other written materials from your doctor or pharmacy?: 1 - Never  Diabetic?Nutrition Risk Assessment:  Has the patient had any N/V/D within the last 2 months?  Yes  Does the patient have any non-healing wounds?  No  Has the patient had any unintentional weight loss or weight gain?  No   Diabetes:  Is the patient diabetic?  Yes  If diabetic, was a CBG obtained today?  No  Did the patient bring in their glucometer from home?  No  How often do you monitor your CBG's? Daily .   Financial Strains and Diabetes Management:  Are you having any financial strains with the device, your supplies or your medication? No .  Does the patient want to be seen by Chronic Care Management for management of their diabetes?  No  Would the patient like to be referred to a Nutritionist or for Diabetic Management?  No   Diabetic Exams:  Diabetic Eye Exam: Overdue for diabetic eye exam. Pt has been advised about the importance in completing this exam. Patient advised to call and schedule an eye exam. Diabetic  Foot Exam: Overdue, Pt has been advised about the importance in completing this exam. Pt is scheduled for diabetic foot exam on 12/07/2021.   Interpreter Needed?: No  Information entered by :: Laurel Dimmer, CMA   Activities of Daily Living    09/03/2021    9:12 AM 06/02/2021    2:41 PM  In your present state of health, do you have any difficulty performing the following activities:  Hearing? 1 1  Comment left ear   Vision? 1 1  Difficulty concentrating or making decisions? 0 0  Walking or climbing stairs? 0 0  Dressing or bathing? 0 0  Doing errands, shopping? 0 0  Patient Care Team: Lorre MunroeBaity, Regina W, NP as PCP - General (Internal Medicine)  Indicate any recent Medical Services you may have received from other than [facility] providers in the past year (date may be approximate). Pt hospitalized on 03/20/2021 for DKA and lactic acidosis.     Assessment:   This is a routine wellness examination for Ian Gross.  Hearing/Vision screen No results found.  Dietary issues and exercise activities discussed: Current Exercise Habits: Home exercise routine, Type of exercise: strength training/weights, Time (Minutes): 20, Frequency (Times/Week): 3, Weekly Exercise (Minutes/Week): 60, Intensity: Mild, Exercise limited by: Other - see comments   Goals Addressed             This Visit's Progress    Exercise 150 min/wk Moderate Activity         Depression Screen    06/02/2021    2:40 PM  PHQ 2/9 Scores  PHQ - 2 Score 1  PHQ- 9 Score 6    Fall Risk    06/02/2021    2:40 PM  Fall Risk   Falls in the past year? 0  Number falls in past yr: 0  Injury with Fall? 0  Risk for fall due to : No Fall Risks  Follow up Falls evaluation completed    FALL RISK PREVENTION PERTAINING TO THE HOME:  Any stairs in or around the home? No  If so, are there any without handrails? Yes  Home free of loose throw rugs in walkways, pet beds, electrical cords, etc? Yes  Adequate lighting in your  home to reduce risk of falls? Yes   ASSISTIVE DEVICES UTILIZED TO PREVENT FALLS:  Life alert? No  Use of a cane, walker or w/c? No  Grab bars in the bathroom? No  Shower chair or bench in shower? No  Elevated toilet seat or a handicapped toilet? No    Cognitive Function:        09/03/2021    9:13 AM  6CIT Screen  What Year? 0 points  What month? 0 points  What time? 0 points  Count back from 20 0 points  Months in reverse 0 points  Repeat phrase 0 points  Total Score 0 points    Immunizations Immunization History  Administered Date(s) Administered   HiB (PRP-T) 05/13/2019   Meningococcal B, OMV 05/23/2019   Meningococcal Conjugate 05/12/2019   Pneumococcal Polysaccharide-23 05/12/2019   Tdap 05/11/2019    TDAP status: Up to date  Flu Vaccine status: Up to date  Pneumococcal vaccine status: Up to date  Covid-19 vaccine status: Information provided on how to obtain vaccines.   Qualifies for Shingles Vaccine? No   Zostavax completed No   Shingrix Completed?: No.    Education has been provided regarding the importance of this vaccine. Patient has been advised to call insurance company to determine out of pocket expense if they have not yet received this vaccine. Advised may also receive vaccine at local pharmacy or Health Dept. Verbalized acceptance and understanding.  Screening Tests Health Maintenance  Topic Date Due   COVID-19 Vaccine (1) Never done   FOOT EXAM  Never done   OPHTHALMOLOGY EXAM  Never done   URINE MICROALBUMIN  Never done   Hepatitis C Screening  Never done   COLONOSCOPY (Pts 45-8540yrs Insurance coverage will need to be confirmed)  Never done   HEMOGLOBIN A1C  09/15/2021   INFLUENZA VACCINE  10/27/2021   TETANUS/TDAP  05/10/2029   HIV Screening  Completed   HPV VACCINES  Aged Out    Health Maintenance  Health Maintenance Due  Topic Date Due   COVID-19 Vaccine (1) Never done   FOOT EXAM  Never done   OPHTHALMOLOGY EXAM  Never done    URINE MICROALBUMIN  Never done   Hepatitis C Screening  Never done   COLONOSCOPY (Pts 45-58yrs Insurance coverage will need to be confirmed)  Never done    Colorectal Screening: Pt aware that he needs a colonoscopy. He was referred to [facility]. He said he will call them and schedule the Colonoscopy   Lung Cancer Screening: (Low Dose CT Chest recommended if Age 82-80 years, 30 pack-year currently smoking OR have quit w/in 15years.) does qualify.   Lung Cancer Screening Referral: No   Additional Screening:  Hepatitis C Screening: does qualify  Vision Screening: Recommended annual ophthalmology exams for early detection of glaucoma and other disorders of the eye. Is the patient up to date with their annual eye exam?  No  Who is the provider or what is the name of the office in which the patient attends annual eye exams? [facility]  If pt is not established with a provider, would they like to be referred to a provider to establish care? Yes .   Dental Screening: Recommended annual dental exams for proper oral hygiene  Community Resource Referral / Chronic Care Management: CRR required this visit?  No   CCM required this visit?  No      Plan:     I have personally reviewed and noted the following in the patient's chart:   Medical and social history Use of alcohol, tobacco or illicit drugs  Current medications and supplements including opioid prescriptions. Patient is not currently taking opioid prescriptions. Functional ability and status Nutritional status Physical activity Advanced directives List of other physicians Hospitalizations, surgeries, and ER visits in previous 12 months Vitals Screenings to include cognitive, depression, and falls Referrals and appointments  In addition, I have reviewed and discussed with patient certain preventive protocols, quality metrics, and best practice recommendations. A written personalized care plan for preventive services as  well as general preventive health recommendations were provided to patient.    Mr. Taborda , Thank you for taking time to come for your Medicare Wellness Visit. I appreciate your ongoing commitment to your health goals. Please review the following plan we discussed and let me know if I can assist you in the future.   These are the goals we discussed:  Goals      Exercise 150 min/wk Moderate Activity        This is a list of the screening recommended for you and due dates:  Health Maintenance  Topic Date Due   COVID-19 Vaccine (1) Never done   Complete foot exam   Never done   Eye exam for diabetics  Never done   Urine Protein Check  Never done   Hepatitis C Screening: USPSTF Recommendation to screen - Ages 85-79 yo.  Never done   Colon Cancer Screening  Never done   Hemoglobin A1C  09/15/2021   Flu Shot  10/27/2021   Tetanus Vaccine  05/10/2029   HIV Screening  Completed   HPV Vaccine  Aged [address] [place], [place]   09/03/2021    Note: The pt has an upcoming Physical Appointment on 12/07/2021 with his PCP Rene Kocher. He was reminded that he will need his A1C checked, Urine Microalbumin and foot exam.

## 2021-09-03 ENCOUNTER — Ambulatory Visit (INDEPENDENT_AMBULATORY_CARE_PROVIDER_SITE_OTHER): Payer: Medicare Other

## 2021-09-03 DIAGNOSIS — Z0000 Encounter for general adult medical examination without abnormal findings: Secondary | ICD-10-CM

## 2021-11-06 ENCOUNTER — Ambulatory Visit (INDEPENDENT_AMBULATORY_CARE_PROVIDER_SITE_OTHER): Payer: Medicare Other | Admitting: Family Medicine

## 2021-11-06 ENCOUNTER — Encounter: Payer: Self-pay | Admitting: Family Medicine

## 2021-11-06 VITALS — BP 106/60 | HR 87 | Temp 97.1°F | Wt 129.0 lb

## 2021-11-06 DIAGNOSIS — L0232 Furuncle of buttock: Secondary | ICD-10-CM

## 2021-11-06 MED ORDER — DOXYCYCLINE HYCLATE 100 MG PO TABS: 100.0000 mg | ORAL_TABLET | Freq: Two times a day (BID) | ORAL | 0 refills | Status: DC

## 2021-11-06 NOTE — Progress Notes (Signed)
Subjective:    Patient ID: Ian Gross, male    DOB: 12-30-1975, 46 y.o.   MRN: [id number]  Ian Gross is a 46 y.o. male presenting on 11/06/2021 for No chief complaint on file.  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 He has had similar abscess in other regions under arm or behind knee. He is using topical antibiotic therapy      06/02/2021    2:40 PM  Depression screen PHQ 2/9  Decreased Interest 0  Down, Depressed, Hopeless 1  PHQ - 2 Score 1  Altered sleeping 3  Tired, decreased energy 1  Change in appetite 1  Feeling bad or failure about yourself  0  Trouble concentrating 0  Moving slowly or fidgety/restless 0  Suicidal thoughts 0  PHQ-9 Score 6  Difficult doing work/chores Not difficult at all    Social History   Tobacco Use   Smoking status: Every Day    Packs/day: 0.75    Types: Cigarettes   Smokeless tobacco: Never  Vaping Use   Vaping Use: Never used  Substance Use Topics   Alcohol use: Not Currently   Drug use: Not Currently    Review of Systems Per HPI unless specifically indicated above     Objective:    BP 106/60 (BP Location: Right Arm, Patient Position: Sitting, Cuff Size: Small)   Pulse 87   Temp (!) 97.1 F (36.2 C) (Temporal)   Wt 129 lb (58.5 kg)   SpO2 98%   BMI 17.50 kg/m   Wt Readings from Last 3 Encounters:  11/06/21 129 lb (58.5 kg)  06/02/21 130 lb (59 kg)  03/17/21 123 lb 14.4 oz (56.2 kg)    Physical Exam Vitals and nursing note reviewed.  Constitutional:      General: He is not in acute distress.    Appearance: Normal appearance. He is well-developed. He is not diaphoretic.     Comments: Well-appearing, comfortable, cooperative  HENT:     Head: Normocephalic and atraumatic.  Eyes:     General:        Right eye: No discharge.         Left eye: No discharge.     Conjunctiva/sclera: Conjunctivae normal.  Cardiovascular:     Rate and Rhythm: Normal rate.  Pulmonary:     Effort: Pulmonary effort is normal.  Skin:    General: Skin is warm and dry.     Findings: Lesion (R gluteal cleft 2 x 2 cm firm deeper indurated abscess, mild tender. no erythema) present. No erythema or rash.  Neurological:     Mental Status: He is alert and oriented to person, place, and time.  Psychiatric:        Mood and Affect: Mood normal.        Behavior: Behavior normal.        Thought Content: Thought content normal.     Comments: Well groomed, good eye contact, normal speech and thoughts    Results for orders placed or performed during the hospital encounter of 03/17/21  Resp Panel by RT-PCR (Flu A&B, Covid)   Specimen: Nasopharyngeal(NP) swabs in vial transport medium  Result Value Ref Range   SARS Coronavirus 2 by RT PCR NEGATIVE NEGATIVE   Influenza A by PCR NEGATIVE NEGATIVE   Influenza B by PCR NEGATIVE NEGATIVE  Culture, blood (  routine x 2)   Specimen: BLOOD  Result Value Ref Range   Specimen Description BLOOD BLOOD LEFT FOREARM    Special Requests      BOTTLES DRAWN AEROBIC AND ANAEROBIC Blood Culture adequate volume   Culture      NO GROWTH 5 DAYS Performed at [facility], [address], [place], [place] [address]    Report Status 03/22/2021 FINAL   Culture, blood (routine x 2)   Specimen: BLOOD  Result Value Ref Range   Specimen Description BLOOD LEFT ANTECUBITAL    Special Requests      BOTTLES DRAWN AEROBIC AND ANAEROBIC Blood Culture adequate volume   Culture      NO GROWTH 5 DAYS Performed at [facility], [address] [place], [place] [address]    Report Status 03/22/2021 FINAL   Urine Culture   Specimen: In/Out Cath Urine  Result Value Ref Range   Specimen Description      IN/OUT CATH URINE Performed at [facility], [address] [place], [place] [address]     Special Requests      NONE Performed at [facility], [address], [place], [place] [address]    Culture      NO GROWTH Performed at [facility], [address], [place], [place] [address]    Report Status 03/19/2021 FINAL   MRSA Next Gen by PCR, Nasal   Specimen: Nasal Mucosa; Nasal Swab  Result Value Ref Range   MRSA by PCR Next Gen NOT DETECTED NOT DETECTED  Urinalysis, Routine w reflex microscopic  Result Value Ref Range   Color, Urine YELLOW (A) YELLOW   APPearance CLEAR (A) CLEAR   Specific Gravity, Urine 1.023 1.005 - 1.030   pH 5.0 5.0 - 8.0   Glucose, UA >=500 (A) NEGATIVE mg/dL   Hgb urine dipstick NEGATIVE NEGATIVE   Bilirubin Urine NEGATIVE NEGATIVE   Ketones, ur 5 (A) NEGATIVE mg/dL   Protein, ur NEGATIVE NEGATIVE mg/dL   Nitrite NEGATIVE NEGATIVE   Leukocytes,Ua NEGATIVE NEGATIVE   RBC / HPF 0-5 0 - 5 RBC/hpf   WBC, UA 0-5 0 - 5 WBC/hpf   Bacteria, UA NONE SEEN NONE SEEN   Squamous Epithelial / LPF NONE SEEN 0 - 5   Mucus PRESENT    Hyaline Casts, UA PRESENT   Beta-hydroxybutyric acid  Result Value Ref Range   Beta-Hydroxybutyric Acid 0.73 (H) 0.05 - 0.27 mmol/L  Comprehensive metabolic panel  Result Value Ref Range   Sodium 136 135 - 145 mmol/L   Potassium 4.5 3.5 - 5.1 mmol/L   Chloride 100 98 - 111 mmol/L   CO2 17 (L) 22 - 32 mmol/L   Glucose, Bld 377 (H) 70 - 99 mg/dL   BUN 18 6 - 20 mg/dL   Creatinine, Ser 9.75 (H) 0.61 - 1.24 mg/dL   Calcium 9.2 8.9 - 88.3 mg/dL   Total Protein 6.6 6.5 - 8.1 g/dL   Albumin 3.7 3.5 - 5.0 g/dL   AST 61 (H) 15 - 41 U/L   ALT 30 0 - 44 U/L   Alkaline Phosphatase 177 (H) 38 - 126 U/L   Total Bilirubin 1.0 0.3 - 1.2 mg/dL   GFR, Estimated >25 >49 mL/min   Anion gap 19 (H) 5 - 15  CBC with Differential  Result Value Ref Range   WBC 14.2 (H) 4.0 - 10.5 K/uL   RBC 4.36 4.22 - 5.81 MIL/uL   Hemoglobin 14.5 13.0 -  17.0 g/dL   HCT 24.0 97.3 - 53.2 %   MCV 100.2 (H) 80.0 - 100.0 fL   MCH 33.3 26.0  - 34.0 pg   MCHC 33.2 30.0 - 36.0 g/dL   RDW 99.2 42.6 - 83.4 %   Platelets 339 150 - 400 K/uL   nRBC 0.0 0.0 - 0.2 %   Neutrophils Relative % 60 %   Neutro Abs 8.5 (H) 1.7 - 7.7 K/uL   Lymphocytes Relative 27 %   Lymphs Abs 3.9 0.7 - 4.0 K/uL   Monocytes Relative 6 %   Monocytes Absolute 0.9 0.1 - 1.0 K/uL   Eosinophils Relative 1 %   Eosinophils Absolute 0.1 0.0 - 0.5 K/uL   Basophils Relative 1 %   Basophils Absolute 0.2 (H) 0.0 - 0.1 K/uL   Immature Granulocytes 5 %   Abs Immature Granulocytes 0.69 (H) 0.00 - 0.07 K/uL  Blood gas, venous  Result Value Ref Range   pH, Ven 7.25 7.250 - 7.430   pCO2, Ven 46 44.0 - 60.0 mmHg   pO2, Ven 47.0 (H) 32.0 - 45.0 mmHg   Bicarbonate 20.2 20.0 - 28.0 mmol/L   Acid-base deficit 7.0 (H) 0.0 - 2.0 mmol/L   O2 Saturation 74.7 %   Patient temperature 37.0   Lactic acid, plasma  Result Value Ref Range   Lactic Acid, Venous 6.6 (HH) 0.5 - 1.9 mmol/L  Protime-INR  Result Value Ref Range   Prothrombin Time 14.1 11.4 - 15.2 seconds   INR 1.1 0.8 - 1.2  APTT  Result Value Ref Range   aPTT 28 24 - 36 seconds  Procalcitonin - Baseline  Result Value Ref Range   Procalcitonin <0.10 ng/mL  Lactic acid, plasma  Result Value Ref Range   Lactic Acid, Venous 6.1 (HH) 0.5 - 1.9 mmol/L  Lactic acid, plasma  Result Value Ref Range   Lactic Acid, Venous 5.6 (HH) 0.5 - 1.9 mmol/L  Basic metabolic panel  Result Value Ref Range   Sodium 136 135 - 145 mmol/L   Potassium 4.3 3.5 - 5.1 mmol/L   Chloride 106 98 - 111 mmol/L   CO2 20 (L) 22 - 32 mmol/L   Glucose, Bld 360 (H) 70 - 99 mg/dL   BUN 19 6 - 20 mg/dL   Creatinine, Ser 1.96 (H) 0.61 - 1.24 mg/dL   Calcium 8.0 (L) 8.9 - 10.3 mg/dL   GFR, Estimated >22 >29 mL/min   Anion gap 10 5 - 15  Basic metabolic panel  Result Value Ref Range   Sodium 138 135 - 145 mmol/L   Potassium 4.1 3.5 - 5.1 mmol/L   Chloride 107 98 - 111 mmol/L   CO2 25 22 - 32 mmol/L   Glucose, Bld 160 (H) 70 - 99 mg/dL    BUN 19 6 - 20 mg/dL   Creatinine, Ser 7.98 0.61 - 1.24 mg/dL   Calcium 8.0 (L) 8.9 - 10.3 mg/dL   GFR, Estimated >92 >11 mL/min   Anion gap 6 5 - 15  Beta-hydroxybutyric acid  Result Value Ref Range   Beta-Hydroxybutyric Acid 0.27 0.05 - 0.27 mmol/L  Beta-hydroxybutyric acid  Result Value Ref Range   Beta-Hydroxybutyric Acid 0.16 0.05 - 0.27 mmol/L  Hemoglobin A1c  Result Value Ref Range   Hgb A1c MFr Bld 7.9 (H) 4.8 - 5.6 %   Mean Plasma Glucose 180 mg/dL  Comprehensive metabolic panel  Result Value Ref Range   Sodium 136 135 - 145 mmol/L  Potassium 4.0 3.5 - 5.1 mmol/L   Chloride 108 98 - 111 mmol/L   CO2 25 22 - 32 mmol/L   Glucose, Bld 213 (H) 70 - 99 mg/dL   BUN 20 6 - 20 mg/dL   Creatinine, Ser 2.95 0.61 - 1.24 mg/dL   Calcium 7.8 (L) 8.9 - 10.3 mg/dL   Total Protein 4.6 (L) 6.5 - 8.1 g/dL   Albumin 2.5 (L) 3.5 - 5.0 g/dL   AST 24 15 - 41 U/L   ALT 22 0 - 44 U/L   Alkaline Phosphatase 121 38 - 126 U/L   Total Bilirubin 0.6 0.3 - 1.2 mg/dL   GFR, Estimated >62 >13 mL/min   Anion gap 3 (L) 5 - 15  CBC  Result Value Ref Range   WBC 21.5 (H) 4.0 - 10.5 K/uL   RBC 3.35 (L) 4.22 - 5.81 MIL/uL   Hemoglobin 11.0 (L) 13.0 - 17.0 g/dL   HCT 08.6 (L) 57.8 - 46.9 %   MCV 95.8 80.0 - 100.0 fL   MCH 32.8 26.0 - 34.0 pg   MCHC 34.3 30.0 - 36.0 g/dL   RDW 62.9 52.8 - 41.3 %   Platelets 268 150 - 400 K/uL   nRBC 0.0 0.0 - 0.2 %  HIV Antibody (routine testing w rflx)  Result Value Ref Range   HIV Screen 4th Generation wRfx Non Reactive Non Reactive  Glucose, capillary  Result Value Ref Range   Glucose-Capillary 135 (H) 70 - 99 mg/dL   Comment 1 Notify RN    Comment 2 Document in Chart   Lactic acid, plasma  Result Value Ref Range   Lactic Acid, Venous 2.4 (HH) 0.5 - 1.9 mmol/L  Lactic acid, plasma  Result Value Ref Range   Lactic Acid, Venous 0.9 0.5 - 1.9 mmol/L  Beta-hydroxybutyric acid  Result Value Ref Range   Beta-Hydroxybutyric Acid 0.61 (H) 0.05 - 0.27 mmol/L   Urine drugs of abuse scrn w alc, routine (Ref Lab)  Result Value Ref Range   Amphetamines, Urine Negative Cutoff=1000 ng/mL   Barbiturate, Ur Negative Cutoff=300 ng/mL   Benzodiazepine Quant, Ur Negative Cutoff=300 ng/mL   Cannabinoid Quant, Ur See Final Results Cutoff=50 ng/mL   Cocaine (Metab.) Negative Cutoff=300 ng/mL   Opiate Quant, Ur Negative Cutoff=300 ng/mL   Phencyclidine, Ur Negative Cutoff=25 ng/mL   Methadone Screen, Urine Negative Cutoff=300 ng/mL   Propoxyphene, Urine Negative Cutoff=300 ng/mL   Ethanol U, Quan Negative Cutoff=0.020 %  Salicylate level  Result Value Ref Range   Salicylate Lvl <7.0 (L) 7.0 - 30.0 mg/dL  Acetaminophen level  Result Value Ref Range   Acetaminophen (Tylenol), Serum <10 (L) 10 - 30 ug/mL  Glucose, capillary  Result Value Ref Range   Glucose-Capillary 151 (H) 70 - 99 mg/dL  Glucose, capillary  Result Value Ref Range   Glucose-Capillary 144 (H) 70 - 99 mg/dL  Glucose, capillary  Result Value Ref Range   Glucose-Capillary 141 (H) 70 - 99 mg/dL   Comment 1 Notify RN    Comment 2 Document in Chart   Glucose, capillary  Result Value Ref Range   Glucose-Capillary 151 (H) 70 - 99 mg/dL  Glucose, capillary  Result Value Ref Range   Glucose-Capillary 195 (H) 70 - 99 mg/dL   Comment 1 Notify RN    Comment 2 Document in Chart   Glucose, capillary  Result Value Ref Range   Glucose-Capillary 102 (H) 70 - 99 mg/dL  Glucose, capillary  Result Value Ref  Range   Glucose-Capillary 98 70 - 99 mg/dL  Glucose, capillary  Result Value Ref Range   Glucose-Capillary 147 (H) 70 - 99 mg/dL  Basic metabolic panel  Result Value Ref Range   Sodium 135 135 - 145 mmol/L   Potassium 3.8 3.5 - 5.1 mmol/L   Chloride 104 98 - 111 mmol/L   CO2 27 22 - 32 mmol/L   Glucose, Bld 106 (H) 70 - 99 mg/dL   BUN 18 6 - 20 mg/dL   Creatinine, Ser 6.83 0.61 - 1.24 mg/dL   Calcium 7.9 (L) 8.9 - 10.3 mg/dL   GFR, Estimated >41 >96 mL/min   Anion gap 4 (L) 5 - 15   CBC  Result Value Ref Range   WBC 11.7 (H) 4.0 - 10.5 K/uL   RBC 3.25 (L) 4.22 - 5.81 MIL/uL   Hemoglobin 10.6 (L) 13.0 - 17.0 g/dL   HCT 22.2 (L) 97.9 - 89.2 %   MCV 96.0 80.0 - 100.0 fL   MCH 32.6 26.0 - 34.0 pg   MCHC 34.0 30.0 - 36.0 g/dL   RDW 11.9 41.7 - 40.8 %   Platelets 247 150 - 400 K/uL   nRBC 0.0 0.0 - 0.2 %  Glucose, capillary  Result Value Ref Range   Glucose-Capillary 262 (H) 70 - 99 mg/dL  Glucose, capillary  Result Value Ref Range   Glucose-Capillary 86 70 - 99 mg/dL   Comment 1 Notify RN    Comment 2 Document in Chart   Glucose, capillary  Result Value Ref Range   Glucose-Capillary 59 (L) 70 - 99 mg/dL   Comment 1 Notify RN    Comment 2 Document in Chart   Glucose, capillary  Result Value Ref Range   Glucose-Capillary 113 (H) 70 - 99 mg/dL  Glucose, capillary  Result Value Ref Range   Glucose-Capillary 93 70 - 99 mg/dL  Glucose, capillary  Result Value Ref Range   Glucose-Capillary 60 (L) 70 - 99 mg/dL  Glucose, capillary  Result Value Ref Range   Glucose-Capillary 175 (H) 70 - 99 mg/dL  Glucose, capillary  Result Value Ref Range   Glucose-Capillary 217 (H) 70 - 99 mg/dL  Glucose, capillary  Result Value Ref Range   Glucose-Capillary 216 (H) 70 - 99 mg/dL  Glucose, capillary  Result Value Ref Range   Glucose-Capillary 223 (H) 70 - 99 mg/dL  Glucose, capillary  Result Value Ref Range   Glucose-Capillary 233 (H) 70 - 99 mg/dL   Comment 1 Notify RN   Glucose, capillary  Result Value Ref Range   Glucose-Capillary 156 (H) 70 - 99 mg/dL  Glucose, capillary  Result Value Ref Range   Glucose-Capillary 80 70 - 99 mg/dL  Glucose, capillary  Result Value Ref Range   Glucose-Capillary 146 (H) 70 - 99 mg/dL  Glucose, capillary  Result Value Ref Range   Glucose-Capillary 196 (H) 70 - 99 mg/dL  Panel [id number]  Result Value Ref Range   Cannabinoid GC/MS, Ur Positive (A) Cutoff=50   CARBOXY THC GC/MS CONF 18 Cutoff=10 ng/mL  CBG monitoring, ED   Result Value Ref Range   Glucose-Capillary 379 (H) 70 - 99 mg/dL  POC CBG, ED  Result Value Ref Range   Glucose-Capillary 396 (H) 70 - 99 mg/dL  CBG monitoring, ED  Result Value Ref Range   Glucose-Capillary 319 (H) 70 - 99 mg/dL  CBG monitoring, ED  Result Value Ref Range   Glucose-Capillary 206 (H) 70 - 99 mg/dL  CBG  monitoring, ED  Result Value Ref Range   Glucose-Capillary 134 (H) 70 - 99 mg/dL  Troponin I (High Sensitivity)  Result Value Ref Range   Troponin I (High Sensitivity) 7 <18 ng/L  Troponin I (High Sensitivity)  Result Value Ref Range   Troponin I (High Sensitivity) 8 <18 ng/L      Assessment & Plan:   Problem List Items Addressed This Visit   None Visit Diagnoses     Boil of buttock    -  Primary   Relevant Medications   doxycycline (VIBRA-TABS) 100 MG tablet       Consistent with new acute left axillary abscess with firm induration without surrounding cellulitis or any systemic symptoms. No history of recurrent abcesses in this or other areas, not consistent with chronic suppurativa hidradenitis. No recent antibiotics.  Plan: Start taking Doxycycline antibiotic 100mg  twice daily for 14 days. Take with full glass of water and stay upright for at least 30 min after taking, may be seated or standing, but should NOT lay down. This is just a safety precaution, if this medicine does not go all the way down throat well it could cause some burning discomfort to throat and esophagus. 2. Warm compresses per handout 3. Return precautions given if worsening or concerns for cellulitis or extension, or oral antibiotic failure, consider add other agent. When to go to ED if need IV or other therapy.  Future consider Gen Surgery for I&D or other procedural intervention.    Meds ordered this encounter  Medications   DISCONTD: doxycycline (VIBRA-TABS) 100 MG tablet    Sig: Take 1 tablet (100 mg total) by mouth 2 (two) times daily. For 10 days. Take with full glass of  water, stay upright 30 min after taking.    Dispense:  28 tablet    Refill:  0   doxycycline (VIBRA-TABS) 100 MG tablet    Sig: Take 1 tablet (100 mg total) by mouth 2 (two) times daily. For 14 days. Take with full glass of water, stay upright 30 min after taking.    Dispense:  28 tablet    Refill:  0    Please correct instructions to stay 14 days instead of 10 please. It should be 28 pills for 14 days.      Follow up plan: Return if symptoms worsen or fail to improve.  Saralyn PilarAlexander Dani Wallner, DO [facility] 11/06/2021, 10:16 AM

## 2021-11-06 NOTE — Patient Instructions (Addendum)
Thank you for coming to the office today.  You have an abscess, which is a localized bacterial infection in deeper layers of skin.  Recommend warm soapy water soaks in bathtub or with warm compresses/wash cloth several times a day to help heal and may actually drain some pus on its own, which can be normal.   Start taking Doxycycline antibiotic 100mg  twice daily for 14 days. Take with full glass of water and stay upright for at least 30 min after taking, may be seated or standing, but should NOT lay down. This is just a safety precaution, if this medicine does not go all the way down throat well it could cause some burning discomfort to throat and esophagus.  You may need to return soon for re-evaluation if worsening such as spreading redness or streaking redness, significantly larger size, increased pain, fevers/chills, nausea vomiting and cannot take antibiotic. If significantly worse symptoms or most of these symptoms, would recommend going straight to Hospital Emergency Dept as you may require IV antibiotics instead.   Please schedule a Follow-up Appointment to: Return if symptoms worsen or fail to improve.  If you have any other questions or concerns, please feel free to call the office or send a message through MyChart. You may also schedule an earlier appointment if necessary.  Additionally, you may be receiving a survey about your experience at our office within a few days to 1 week by e-mail or mail. We value your feedback.  , DO [facility], [facility]

## 2021-12-07 ENCOUNTER — Encounter: Payer: Medicare Other | Admitting: Internal Medicine

## 2021-12-09 ENCOUNTER — Ambulatory Visit (INDEPENDENT_AMBULATORY_CARE_PROVIDER_SITE_OTHER): Payer: Medicare Other | Admitting: Internal Medicine

## 2021-12-09 ENCOUNTER — Encounter: Payer: Self-pay | Admitting: Internal Medicine

## 2021-12-09 VITALS — BP 114/62 | HR 76 | Temp 97.3°F | Ht 72.0 in | Wt 129.0 lb

## 2021-12-09 DIAGNOSIS — E1165 Type 2 diabetes mellitus with hyperglycemia: Secondary | ICD-10-CM | POA: Diagnosis not present

## 2021-12-09 DIAGNOSIS — Z1211 Encounter for screening for malignant neoplasm of colon: Secondary | ICD-10-CM | POA: Diagnosis not present

## 2021-12-09 DIAGNOSIS — Z0001 Encounter for general adult medical examination with abnormal findings: Secondary | ICD-10-CM

## 2021-12-09 DIAGNOSIS — Z1159 Encounter for screening for other viral diseases: Secondary | ICD-10-CM | POA: Diagnosis not present

## 2021-12-09 DIAGNOSIS — Z794 Long term (current) use of insulin: Secondary | ICD-10-CM

## 2021-12-09 MED ORDER — CREON 24000-76000 UNITS PO CPEP: 3.0000 | ORAL_CAPSULE | Freq: Three times a day (TID) | ORAL | 1 refills | Status: DC

## 2021-12-09 NOTE — Progress Notes (Signed)
Subjective:    Patient ID: Ian Gross, male    DOB: 11-03-1975, 46 y.o.   MRN: [id number]  HPI  Patient presents to clinic today for his annual exam.  Flu: never Tetanus: 04/2019 COVID: never Pneumovax: 04/2019 Colon screening: never Vision screening: as needed Dentist: as needed  Diet: He does eat meat. He consumes fruits and veggies. He tries to avoid fried foods. He drinks mostly gatorade, soda. Exercise: Walking  Review of Systems  Past Medical History:  Diagnosis Date   Diabetes mellitus without complication (HCC)     Current Outpatient Medications  Medication Sig Dispense Refill   Buprenorphine HCl-Naloxone HCl 8-2 MG FILM Place 1 strip under the tongue 2 (two) times daily.     doxycycline (VIBRA-TABS) 100 MG tablet Take 1 tablet (100 mg total) by mouth 2 (two) times daily. For 14 days. Take with full glass of water, stay upright 30 min after taking. 28 tablet 0   HUMALOG KWIKPEN 100 UNIT/ML KwikPen Inject 4-9 Units into the skin 3 (three) times daily. Sliding scale (go up by 1 unit)     insulin glargine (LANTUS) 100 UNIT/ML injection Inject 8 Units into the skin at bedtime.     Pancrelipase, Lip-Prot-Amyl, (CREON) 24000-76000 units CPEP Take 3 capsules by mouth 3 (three) times daily.     No current facility-administered medications for this visit.    No Known Allergies  Family History  Problem Relation Age of Onset   Lung cancer Father    Healthy Sister    Healthy Brother    CVA Maternal Grandmother    Liver cancer Maternal Grandfather    Alcohol abuse Maternal Grandfather     Social History   Socioeconomic History   Marital status: Significant Other    Spouse name: Not on file   Number of children: 2   Years of education: Not on file   Highest education level: Not on file  Occupational History   Occupation: Disable  Tobacco Use   Smoking status: Every Day    Packs/day: 0.75    Types: Cigarettes   Smokeless tobacco: Never  Vaping Use    Vaping Use: Never used  Substance and Sexual Activity   Alcohol use: Not Currently   Drug use: Not Currently   Sexual activity: Not on file  Other Topics Concern   Not on file  Social History Narrative   Not on file   Social Determinants of Health   Financial Resource Strain: Low Risk  (09/03/2021)   Overall Financial Resource Strain (CARDIA)    Difficulty of Paying Living Expenses: Not hard at all  Food Insecurity: No Food Insecurity (09/03/2021)   Hunger Vital Sign    Worried About Running Out of Food in the Last Year: Never true    [place] in the Last Year: Never true  Transportation Needs: No Transportation Needs (09/03/2021)   PRAPARE - Hydrologist (Medical): No    Lack of Transportation (Non-Medical): No  Physical Activity: Not on file  Stress: No Stress Concern Present (09/03/2021)   [place]    Feeling of Stress : Only a little  Social Connections: Moderately Isolated (09/03/2021)   Social Connection and Isolation Panel [NHANES]    Frequency of Communication with Friends and Family: Twice a week    Frequency of Social Gatherings with Friends and Family: More than three times a week    Attends Religious Services:  Never    Active Member of Clubs or Organizations: No    Attends Archivist Meetings: Not on file    Marital Status: Living with partner  Intimate Partner Violence: Not At Risk (09/03/2021)   Humiliation, Afraid, Rape, and Kick questionnaire    Fear of Current or Ex-Partner: No    Emotionally Abused: No    Physically Abused: No    Sexually Abused: No     Constitutional: Denies fever, malaise, fatigue, headache or abrupt weight changes.  HEENT: Denies eye pain, eye redness, ear pain, ringing in the ears, wax buildup, runny nose, nasal congestion, bloody nose, or sore throat. Respiratory: Denies difficulty breathing, shortness of breath, cough or sputum  production.   Cardiovascular: Denies chest pain, chest tightness, palpitations or swelling in the hands or feet.  Gastrointestinal: Pt reports chronic abdominal pain. Denies bloating, constipation, diarrhea or blood in the stool.  GU: Denies urgency, frequency, pain with urination, burning sensation, blood in urine, odor or discharge. Musculoskeletal: Denies decrease in range of motion, difficulty with gait, muscle pain or joint pain and swelling.  Skin: Denies redness, rashes, lesions or ulcercations.  Neurological: Denies dizziness, difficulty with memory, difficulty with speech or problems with balance and coordination.  Psych: Denies anxiety, depression, SI/HI.  No other specific complaints in a complete review of systems (except as listed in HPI above).     Objective:   Physical Exam BP 114/62 (BP Location: Right Arm, Patient Position: Sitting, Cuff Size: Normal)   Pulse 76   Temp (!) 97.3 F (36.3 C) (Temporal)   Ht 6' (1.829 m)   Wt 129 lb (58.5 kg)   SpO2 98%   BMI 17.50 kg/m   Wt Readings from Last 3 Encounters:  11/06/21 129 lb (58.5 kg)  06/02/21 130 lb (59 kg)  03/17/21 123 lb 14.4 oz (56.2 kg)    General: Appears his stated age, well developed, well nourished in NAD. Skin: Warm, dry and intact. No ulcerations noted. HEENT: Head: normal shape and size; Eyes: sclera white, no icterus, conjunctiva pink, PERRLA and EOMs intact;  Neck:  Neck supple, trachea midline. No masses, lumps or thyromegaly present.  Cardiovascular: Normal rate and rhythm. S1,S2 noted.  No murmur, rubs or gallops noted. No JVD or BLE edema.  Pulmonary/Chest: Normal effort and positive vesicular breath sounds. No respiratory distress. No wheezes, rales or ronchi noted.  Abdomen: Normal bowel sounds.  Musculoskeletal: Strength 5/5 BUE/BLE. No difficulty with gait.  Neurological: Alert and oriented. Cranial nerves II-XII grossly intact. Coordination normal.  Psychiatric: Mood and affect normal.  Behavior is normal. Judgment and thought content normal.    BMET    Component Value Date/Time   NA 135 03/19/2021 0426   K 3.8 03/19/2021 0426   CL 104 03/19/2021 0426   CO2 27 03/19/2021 0426   GLUCOSE 106 (H) 03/19/2021 0426   BUN 18 03/19/2021 0426   CREATININE 0.82 03/19/2021 0426   CALCIUM 7.9 (L) 03/19/2021 0426   GFRNONAA >60 03/19/2021 0426   GFRAA >60 08/01/2019 0509    Lipid Panel  No results found for: "CHOL", "TRIG", "HDL", "CHOLHDL", "VLDL", "LDLCALC"  CBC    Component Value Date/Time   WBC 11.7 (H) 03/19/2021 0426   RBC 3.25 (L) 03/19/2021 0426   HGB 10.6 (L) 03/19/2021 0426   HCT 31.2 (L) 03/19/2021 0426   PLT 247 03/19/2021 0426   MCV 96.0 03/19/2021 0426   MCH 32.6 03/19/2021 0426   MCHC 34.0 03/19/2021 0426  RDW 14.1 03/19/2021 0426   LYMPHSABS 3.9 03/17/2021 1243   MONOABS 0.9 03/17/2021 1243   EOSABS 0.1 03/17/2021 1243   BASOSABS 0.2 (H) 03/17/2021 1243    Hgb A1C Lab Results  Component Value Date   HGBA1C 7.9 (H) 03/17/2021           Assessment & Plan:   Preventative Health Maintenance:  He declines Flu shot today Tetanus UTD Encouraged him to get a COVID-vaccine Pneumovax UTD Referral to GI for screening colonoscopy Encouraged him to consume a balanced diet and exercise regimen Advised him to see an eye doctor and dentist annually We will check CBC, c-Met, lipid, A1c, urine microalbumin and hep C today  RTC in 3 months, follow-up chronic conditions Webb Silversmith, NP

## 2021-12-09 NOTE — Patient Instructions (Signed)
Health Maintenance, Male Adopting a healthy lifestyle and getting preventive care are important in promoting health and wellness. Ask your health care provider about: The right schedule for you to have regular tests and exams. Things you can do on your own to prevent diseases and keep yourself healthy. What should I know about diet, weight, and exercise? Eat a healthy diet  Eat a diet that includes plenty of vegetables, fruits, low-fat dairy products, and lean protein. Do not eat a lot of foods that are high in solid fats, added sugars, or sodium. Maintain a healthy weight Body mass index (BMI) is a measurement that can be used to identify possible weight problems. It estimates body fat based on height and weight. Your health care provider can help determine your BMI and help you achieve or maintain a healthy weight. Get regular exercise Get regular exercise. This is one of the most important things you can do for your health. Most adults should: Exercise for at least 150 minutes each week. The exercise should increase your heart rate and make you sweat (moderate-intensity exercise). Do strengthening exercises at least twice a week. This is in addition to the moderate-intensity exercise. Spend less time sitting. Even light physical activity can be beneficial. Watch cholesterol and blood lipids Have your blood tested for lipids and cholesterol at 48 years of age, then have this test every 5 years. You may need to have your cholesterol levels checked more often if: Your lipid or cholesterol levels are high. You are older than 48 years of age. You are at high risk for heart disease. What should I know about cancer screening? Many types of cancers can be detected early and may often be prevented. Depending on your health history and family history, you may need to have cancer screening at various ages. This may include screening for: Colorectal cancer. Prostate cancer. Skin cancer. Lung  cancer. What should I know about heart disease, diabetes, and high blood pressure? Blood pressure and heart disease High blood pressure causes heart disease and increases the risk of stroke. This is more likely to develop in people who have high blood pressure readings or are overweight. Talk with your health care provider about your target blood pressure readings. Have your blood pressure checked: Every 3-5 years if you are 18-39 years of age. Every year if you are 40 years old or older. If you are between the ages of 65 and 75 and are a current or former smoker, ask your health care provider if you should have a one-time screening for abdominal aortic aneurysm (AAA). Diabetes Have regular diabetes screenings. This checks your fasting blood sugar level. Have the screening done: Once every three years after age 45 if you are at a normal weight and have a low risk for diabetes. More often and at a younger age if you are overweight or have a high risk for diabetes. What should I know about preventing infection? Hepatitis B If you have a higher risk for hepatitis B, you should be screened for this virus. Talk with your health care provider to find out if you are at risk for hepatitis B infection. Hepatitis C Blood testing is recommended for: Everyone born from 1945 through 1965. Anyone with known risk factors for hepatitis C. Sexually transmitted infections (STIs) You should be screened each year for STIs, including gonorrhea and chlamydia, if: You are sexually active and are younger than 48 years of age. You are older than 48 years of age and your   health care provider tells you that you are at risk for this type of infection. Your sexual activity has changed since you were last screened, and you are at increased risk for chlamydia or gonorrhea. Ask your health care provider if you are at risk. Ask your health care provider about whether you are at high risk for HIV. Your health care provider  may recommend a prescription medicine to help prevent HIV infection. If you choose to take medicine to prevent HIV, you should first get tested for HIV. You should then be tested every 3 months for as long as you are taking the medicine. Follow these instructions at home: Alcohol use Do not drink alcohol if your health care provider tells you not to drink. If you drink alcohol: Limit how much you have to 0-2 drinks a day. Know how much alcohol is in your drink. In the [place] one drink equals one 12 oz bottle of beer (355 mL), one 5 oz glass of wine (148 mL), or one 1 oz glass of hard liquor (44 mL). Lifestyle Do not use any products that contain nicotine or tobacco. These products include cigarettes, chewing tobacco, and vaping devices, such as e-cigarettes. If you need help quitting, ask your health care provider. Do not use street drugs. Do not share needles. Ask your health care provider for help if you need support or information about quitting drugs. General instructions Schedule regular health, dental, and eye exams. Stay current with your vaccines. Tell your health care provider if: You often feel depressed. You have ever been abused or do not feel safe at home. Summary Adopting a healthy lifestyle and getting preventive care are important in promoting health and wellness. Follow your health care provider's instructions about healthy diet, exercising, and getting tested or screened for diseases. Follow your health care provider's instructions on monitoring your cholesterol and blood pressure. This information is not intended to replace advice given to you by your health care provider. Make sure you discuss any questions you have with your health care provider. Document Revised: 08/04/2020 Document Reviewed: 08/04/2020 [employer] Patient Education  2023 [employer]  

## 2021-12-10 LAB — CBC
HCT: 39.5 % (ref 38.5–50.0)
Hemoglobin: 14.1 g/dL (ref 13.2–17.1)
MCH: 33.9 pg — ABNORMAL HIGH (ref 27.0–33.0)
MCHC: 35.7 g/dL (ref 32.0–36.0)
MCV: 95 fL (ref 80.0–100.0)
MPV: 12.2 fL (ref 7.5–12.5)
Platelets: 267 10*3/uL (ref 140–400)
RBC: 4.16 10*6/uL — ABNORMAL LOW (ref 4.20–5.80)
RDW: 12.6 % (ref 11.0–15.0)
WBC: 10.3 10*3/uL (ref 3.8–10.8)

## 2021-12-10 LAB — COMPLETE METABOLIC PANEL WITH GFR
AG Ratio: 1.7 (calc) (ref 1.0–2.5)
ALT: 23 U/L (ref 9–46)
AST: 34 U/L (ref 10–40)
Albumin: 3.4 g/dL — ABNORMAL LOW (ref 3.6–5.1)
Alkaline phosphatase (APISO): 148 U/L — ABNORMAL HIGH (ref 36–130)
BUN: 12 mg/dL (ref 7–25)
CO2: 29 mmol/L (ref 20–32)
Calcium: 8.7 mg/dL (ref 8.6–10.3)
Chloride: 105 mmol/L (ref 98–110)
Creat: 0.96 mg/dL (ref 0.60–1.29)
Globulin: 2 g/dL (calc) (ref 1.9–3.7)
Glucose, Bld: 125 mg/dL — ABNORMAL HIGH (ref 65–99)
Potassium: 4 mmol/L (ref 3.5–5.3)
Sodium: 141 mmol/L (ref 135–146)
Total Bilirubin: 0.6 mg/dL (ref 0.2–1.2)
Total Protein: 5.4 g/dL — ABNORMAL LOW (ref 6.1–8.1)
eGFR: 98 mL/min/{1.73_m2} (ref >=60)

## 2021-12-10 LAB — LIPID PANEL
Cholesterol: 90 mg/dL (ref <200)
HDL: 46 mg/dL (ref >=40)
LDL Cholesterol (Calc): 30 mg/dL (calc)
Non-HDL Cholesterol (Calc): 44 mg/dL (calc) (ref <130)
Total CHOL/HDL Ratio: 2 (calc) (ref <5.0)
Triglycerides: 67 mg/dL (ref <150)

## 2021-12-10 LAB — MICROALBUMIN / CREATININE URINE RATIO
Creatinine, Urine: 210 mg/dL (ref 20–320)
Microalb Creat Ratio: 1 mcg/mg creat (ref <30)
Microalb, Ur: 0.3 mg/dL

## 2021-12-10 LAB — HEMOGLOBIN A1C
Hgb A1c MFr Bld: 7.5 % of total Hgb — ABNORMAL HIGH (ref <5.7)
Mean Plasma Glucose: 169 mg/dL
eAG (mmol/L): 9.3 mmol/L

## 2021-12-10 LAB — HEPATITIS C ANTIBODY: Hepatitis C Ab: NONREACTIVE

## 2021-12-14 ENCOUNTER — Other Ambulatory Visit: Payer: Self-pay

## 2021-12-30 DIAGNOSIS — E139 Other specified diabetes mellitus without complications: Secondary | ICD-10-CM | POA: Diagnosis not present

## 2021-12-30 DIAGNOSIS — E119 Type 2 diabetes mellitus without complications: Secondary | ICD-10-CM | POA: Diagnosis not present

## 2021-12-30 DIAGNOSIS — Z794 Long term (current) use of insulin: Secondary | ICD-10-CM | POA: Diagnosis not present

## 2022-01-08 DIAGNOSIS — Z79899 Other long term (current) drug therapy: Secondary | ICD-10-CM | POA: Diagnosis not present

## 2022-01-08 DIAGNOSIS — R52 Pain, unspecified: Secondary | ICD-10-CM | POA: Diagnosis not present

## 2022-01-16 DIAGNOSIS — Z720 Tobacco use: Secondary | ICD-10-CM | POA: Diagnosis not present

## 2022-01-16 DIAGNOSIS — Z79899 Other long term (current) drug therapy: Secondary | ICD-10-CM | POA: Diagnosis not present

## 2022-01-16 DIAGNOSIS — R52 Pain, unspecified: Secondary | ICD-10-CM | POA: Diagnosis not present

## 2022-01-16 DIAGNOSIS — K861 Other chronic pancreatitis: Secondary | ICD-10-CM | POA: Diagnosis not present

## 2022-02-12 DIAGNOSIS — Z79899 Other long term (current) drug therapy: Secondary | ICD-10-CM | POA: Diagnosis not present

## 2022-02-12 DIAGNOSIS — R52 Pain, unspecified: Secondary | ICD-10-CM | POA: Diagnosis not present

## 2022-02-13 DIAGNOSIS — R52 Pain, unspecified: Secondary | ICD-10-CM | POA: Diagnosis not present

## 2022-02-13 DIAGNOSIS — Z79899 Other long term (current) drug therapy: Secondary | ICD-10-CM | POA: Diagnosis not present

## 2022-03-11 ENCOUNTER — Other Ambulatory Visit: Payer: Self-pay | Admitting: Internal Medicine

## 2022-03-11 MED ORDER — CREON 24000-76000 UNITS PO CPEP: 3.0000 | ORAL_CAPSULE | Freq: Three times a day (TID) | ORAL | 1 refills | Status: DC

## 2022-03-12 DIAGNOSIS — R52 Pain, unspecified: Secondary | ICD-10-CM | POA: Diagnosis not present

## 2022-03-12 DIAGNOSIS — Z79899 Other long term (current) drug therapy: Secondary | ICD-10-CM | POA: Diagnosis not present

## 2022-03-20 DIAGNOSIS — Z79899 Other long term (current) drug therapy: Secondary | ICD-10-CM | POA: Diagnosis not present

## 2022-03-20 DIAGNOSIS — R52 Pain, unspecified: Secondary | ICD-10-CM | POA: Diagnosis not present

## 2022-04-02 DIAGNOSIS — E119 Type 2 diabetes mellitus without complications: Secondary | ICD-10-CM | POA: Diagnosis not present

## 2022-04-02 DIAGNOSIS — Z794 Long term (current) use of insulin: Secondary | ICD-10-CM | POA: Diagnosis not present

## 2022-04-16 DIAGNOSIS — Z79899 Other long term (current) drug therapy: Secondary | ICD-10-CM | POA: Diagnosis not present

## 2022-04-16 DIAGNOSIS — R52 Pain, unspecified: Secondary | ICD-10-CM | POA: Diagnosis not present

## 2022-04-17 DIAGNOSIS — Z79899 Other long term (current) drug therapy: Secondary | ICD-10-CM | POA: Diagnosis not present

## 2022-04-17 DIAGNOSIS — R52 Pain, unspecified: Secondary | ICD-10-CM | POA: Diagnosis not present

## 2022-04-28 DIAGNOSIS — E139 Other specified diabetes mellitus without complications: Secondary | ICD-10-CM | POA: Diagnosis not present

## 2022-04-28 LAB — COMPREHENSIVE METABOLIC PANEL: eGFR: >90

## 2022-05-14 DIAGNOSIS — R52 Pain, unspecified: Secondary | ICD-10-CM | POA: Diagnosis not present

## 2022-05-14 DIAGNOSIS — Z79899 Other long term (current) drug therapy: Secondary | ICD-10-CM | POA: Diagnosis not present

## 2022-05-15 DIAGNOSIS — K861 Other chronic pancreatitis: Secondary | ICD-10-CM | POA: Diagnosis not present

## 2022-05-15 DIAGNOSIS — R52 Pain, unspecified: Secondary | ICD-10-CM | POA: Diagnosis not present

## 2022-05-15 DIAGNOSIS — Z720 Tobacco use: Secondary | ICD-10-CM | POA: Diagnosis not present

## 2022-05-15 DIAGNOSIS — Z79899 Other long term (current) drug therapy: Secondary | ICD-10-CM | POA: Diagnosis not present

## 2022-06-11 DIAGNOSIS — Z79899 Other long term (current) drug therapy: Secondary | ICD-10-CM | POA: Diagnosis not present

## 2022-06-11 DIAGNOSIS — R52 Pain, unspecified: Secondary | ICD-10-CM | POA: Diagnosis not present

## 2022-06-12 DIAGNOSIS — K861 Other chronic pancreatitis: Secondary | ICD-10-CM | POA: Diagnosis not present

## 2022-06-12 DIAGNOSIS — Z720 Tobacco use: Secondary | ICD-10-CM | POA: Diagnosis not present

## 2022-06-12 DIAGNOSIS — R52 Pain, unspecified: Secondary | ICD-10-CM | POA: Diagnosis not present

## 2022-06-12 DIAGNOSIS — Z79899 Other long term (current) drug therapy: Secondary | ICD-10-CM | POA: Diagnosis not present

## 2022-06-15 ENCOUNTER — Inpatient Hospital Stay
Admission: EM | Admit: 2022-06-15 | Discharge: 2022-06-15 | DRG: 281 | Disposition: A | Discharge status: Other Acute Inpatient Hospital | Payer: Medicare Other | Attending: Internal Medicine | Admitting: Internal Medicine

## 2022-06-15 ENCOUNTER — Encounter
Admission: EM | Disposition: A | Discharge status: Other Acute Inpatient Hospital | Payer: Self-pay | Source: Home / Self Care | Attending: Internal Medicine

## 2022-06-15 ENCOUNTER — Other Ambulatory Visit: Payer: Self-pay

## 2022-06-15 ENCOUNTER — Inpatient Hospital Stay (HOSPITAL_COMMUNITY)
Admission: EM | Admit: 2022-06-15 | Discharge: 2022-07-05 | DRG: 219 | Disposition: A | Discharge status: Home Health | Payer: Medicare Other | Source: Other Acute Inpatient Hospital | Attending: Thoracic Surgery (Cardiothoracic Vascular Surgery) | Admitting: Thoracic Surgery (Cardiothoracic Vascular Surgery)

## 2022-06-15 ENCOUNTER — Encounter (HOSPITAL_COMMUNITY): Payer: Self-pay

## 2022-06-15 ENCOUNTER — Encounter: Payer: Self-pay | Admitting: Physician Assistant

## 2022-06-15 DIAGNOSIS — Z79891 Long term (current) use of opiate analgesic: Secondary | ICD-10-CM | POA: Diagnosis not present

## 2022-06-15 DIAGNOSIS — Z951 Presence of aortocoronary bypass graft: Secondary | ICD-10-CM | POA: Diagnosis not present

## 2022-06-15 DIAGNOSIS — E119 Type 2 diabetes mellitus without complications: Secondary | ICD-10-CM | POA: Diagnosis present

## 2022-06-15 DIAGNOSIS — F17200 Nicotine dependence, unspecified, uncomplicated: Secondary | ICD-10-CM | POA: Diagnosis not present

## 2022-06-15 DIAGNOSIS — Z9081 Acquired absence of spleen: Secondary | ICD-10-CM

## 2022-06-15 DIAGNOSIS — Z794 Long term (current) use of insulin: Secondary | ICD-10-CM | POA: Diagnosis not present

## 2022-06-15 DIAGNOSIS — K8681 Exocrine pancreatic insufficiency: Secondary | ICD-10-CM | POA: Diagnosis present

## 2022-06-15 DIAGNOSIS — Z9049 Acquired absence of other specified parts of digestive tract: Secondary | ICD-10-CM | POA: Diagnosis not present

## 2022-06-15 DIAGNOSIS — Z79899 Other long term (current) drug therapy: Secondary | ICD-10-CM

## 2022-06-15 DIAGNOSIS — I213 ST elevation (STEMI) myocardial infarction of unspecified site: Secondary | ICD-10-CM | POA: Diagnosis not present

## 2022-06-15 DIAGNOSIS — R0789 Other chest pain: Secondary | ICD-10-CM | POA: Diagnosis not present

## 2022-06-15 DIAGNOSIS — K5900 Constipation, unspecified: Secondary | ICD-10-CM | POA: Diagnosis not present

## 2022-06-15 DIAGNOSIS — E871 Hypo-osmolality and hyponatremia: Secondary | ICD-10-CM | POA: Diagnosis not present

## 2022-06-15 DIAGNOSIS — F1011 Alcohol abuse, in remission: Secondary | ICD-10-CM | POA: Diagnosis present

## 2022-06-15 DIAGNOSIS — Z681 Body mass index (BMI) 19 or less, adult: Secondary | ICD-10-CM

## 2022-06-15 DIAGNOSIS — I509 Heart failure, unspecified: Secondary | ICD-10-CM | POA: Diagnosis not present

## 2022-06-15 DIAGNOSIS — I110 Hypertensive heart disease with heart failure: Secondary | ICD-10-CM | POA: Diagnosis present

## 2022-06-15 DIAGNOSIS — R001 Bradycardia, unspecified: Secondary | ICD-10-CM | POA: Diagnosis not present

## 2022-06-15 DIAGNOSIS — I350 Nonrheumatic aortic (valve) stenosis: Secondary | ICD-10-CM | POA: Diagnosis not present

## 2022-06-15 DIAGNOSIS — F17210 Nicotine dependence, cigarettes, uncomplicated: Secondary | ICD-10-CM | POA: Diagnosis not present

## 2022-06-15 DIAGNOSIS — I252 Old myocardial infarction: Secondary | ICD-10-CM | POA: Diagnosis not present

## 2022-06-15 DIAGNOSIS — Z800 Family history of malignant neoplasm of digestive organs: Secondary | ICD-10-CM

## 2022-06-15 DIAGNOSIS — I2510 Atherosclerotic heart disease of native coronary artery without angina pectoris: Secondary | ICD-10-CM

## 2022-06-15 DIAGNOSIS — D62 Acute posthemorrhagic anemia: Secondary | ICD-10-CM

## 2022-06-15 DIAGNOSIS — R079 Chest pain, unspecified: Principal | ICD-10-CM

## 2022-06-15 DIAGNOSIS — Z1152 Encounter for screening for COVID-19: Secondary | ICD-10-CM | POA: Diagnosis not present

## 2022-06-15 DIAGNOSIS — E440 Moderate protein-calorie malnutrition: Secondary | ICD-10-CM | POA: Diagnosis not present

## 2022-06-15 DIAGNOSIS — J939 Pneumothorax, unspecified: Secondary | ICD-10-CM | POA: Diagnosis not present

## 2022-06-15 DIAGNOSIS — D6959 Other secondary thrombocytopenia: Secondary | ICD-10-CM | POA: Diagnosis present

## 2022-06-15 DIAGNOSIS — I10 Essential (primary) hypertension: Secondary | ICD-10-CM | POA: Diagnosis present

## 2022-06-15 DIAGNOSIS — R9431 Abnormal electrocardiogram [ECG] [EKG]: Secondary | ICD-10-CM | POA: Diagnosis not present

## 2022-06-15 DIAGNOSIS — I214 Non-ST elevation (NSTEMI) myocardial infarction: Principal | ICD-10-CM | POA: Diagnosis present

## 2022-06-15 DIAGNOSIS — E1351 Other specified diabetes mellitus with diabetic peripheral angiopathy without gangrene: Secondary | ICD-10-CM | POA: Diagnosis present

## 2022-06-15 DIAGNOSIS — I959 Hypotension, unspecified: Secondary | ICD-10-CM | POA: Diagnosis not present

## 2022-06-15 DIAGNOSIS — I25110 Atherosclerotic heart disease of native coronary artery with unstable angina pectoris: Secondary | ICD-10-CM | POA: Diagnosis not present

## 2022-06-15 DIAGNOSIS — E13649 Other specified diabetes mellitus with hypoglycemia without coma: Secondary | ICD-10-CM | POA: Diagnosis not present

## 2022-06-15 DIAGNOSIS — E109 Type 1 diabetes mellitus without complications: Secondary | ICD-10-CM | POA: Diagnosis not present

## 2022-06-15 DIAGNOSIS — E8729 Other acidosis: Secondary | ICD-10-CM | POA: Diagnosis not present

## 2022-06-15 DIAGNOSIS — I25119 Atherosclerotic heart disease of native coronary artery with unspecified angina pectoris: Secondary | ICD-10-CM | POA: Diagnosis present

## 2022-06-15 DIAGNOSIS — Z90410 Acquired total absence of pancreas: Secondary | ICD-10-CM | POA: Diagnosis not present

## 2022-06-15 DIAGNOSIS — K219 Gastro-esophageal reflux disease without esophagitis: Secondary | ICD-10-CM | POA: Diagnosis not present

## 2022-06-15 DIAGNOSIS — I255 Ischemic cardiomyopathy: Secondary | ICD-10-CM | POA: Diagnosis present

## 2022-06-15 DIAGNOSIS — K0889 Other specified disorders of teeth and supporting structures: Secondary | ICD-10-CM | POA: Diagnosis not present

## 2022-06-15 DIAGNOSIS — I358 Other nonrheumatic aortic valve disorders: Secondary | ICD-10-CM | POA: Diagnosis not present

## 2022-06-15 DIAGNOSIS — I700 Atherosclerosis of aorta: Secondary | ICD-10-CM | POA: Diagnosis present

## 2022-06-15 DIAGNOSIS — G894 Chronic pain syndrome: Secondary | ICD-10-CM | POA: Diagnosis present

## 2022-06-15 DIAGNOSIS — Z743 Need for continuous supervision: Secondary | ICD-10-CM | POA: Diagnosis not present

## 2022-06-15 DIAGNOSIS — K011 Impacted teeth: Secondary | ICD-10-CM | POA: Diagnosis present

## 2022-06-15 DIAGNOSIS — Z8639 Personal history of other endocrine, nutritional and metabolic disease: Secondary | ICD-10-CM | POA: Diagnosis not present

## 2022-06-15 DIAGNOSIS — R000 Tachycardia, unspecified: Secondary | ICD-10-CM | POA: Diagnosis not present

## 2022-06-15 DIAGNOSIS — I351 Nonrheumatic aortic (valve) insufficiency: Secondary | ICD-10-CM | POA: Diagnosis not present

## 2022-06-15 DIAGNOSIS — Z9911 Dependence on respirator [ventilator] status: Secondary | ICD-10-CM | POA: Diagnosis not present

## 2022-06-15 DIAGNOSIS — K056 Periodontal disease, unspecified: Secondary | ICD-10-CM | POA: Diagnosis present

## 2022-06-15 DIAGNOSIS — E139 Other specified diabetes mellitus without complications: Secondary | ICD-10-CM | POA: Diagnosis not present

## 2022-06-15 DIAGNOSIS — E891 Postprocedural hypoinsulinemia: Secondary | ICD-10-CM | POA: Diagnosis not present

## 2022-06-15 DIAGNOSIS — K029 Dental caries, unspecified: Secondary | ICD-10-CM | POA: Diagnosis not present

## 2022-06-15 DIAGNOSIS — Z801 Family history of malignant neoplasm of trachea, bronchus and lung: Secondary | ICD-10-CM

## 2022-06-15 DIAGNOSIS — Z01810 Encounter for preprocedural cardiovascular examination: Secondary | ICD-10-CM | POA: Diagnosis not present

## 2022-06-15 DIAGNOSIS — I70202 Unspecified atherosclerosis of native arteries of extremities, left leg: Secondary | ICD-10-CM | POA: Diagnosis not present

## 2022-06-15 DIAGNOSIS — Z823 Family history of stroke: Secondary | ICD-10-CM

## 2022-06-15 DIAGNOSIS — I499 Cardiac arrhythmia, unspecified: Secondary | ICD-10-CM | POA: Diagnosis not present

## 2022-06-15 DIAGNOSIS — J9811 Atelectasis: Secondary | ICD-10-CM | POA: Diagnosis not present

## 2022-06-15 DIAGNOSIS — J90 Pleural effusion, not elsewhere classified: Secondary | ICD-10-CM | POA: Diagnosis not present

## 2022-06-15 DIAGNOSIS — J449 Chronic obstructive pulmonary disease, unspecified: Secondary | ICD-10-CM | POA: Diagnosis not present

## 2022-06-15 DIAGNOSIS — I5021 Acute systolic (congestive) heart failure: Secondary | ICD-10-CM | POA: Diagnosis not present

## 2022-06-15 DIAGNOSIS — K8689 Other specified diseases of pancreas: Secondary | ICD-10-CM | POA: Diagnosis not present

## 2022-06-15 DIAGNOSIS — Z01818 Encounter for other preprocedural examination: Secondary | ICD-10-CM | POA: Diagnosis not present

## 2022-06-15 HISTORY — DX: Type 2 diabetes mellitus with unspecified complications: E11.8

## 2022-06-15 HISTORY — PX: LEFT HEART CATH AND CORONARY ANGIOGRAPHY: CATH118249

## 2022-06-15 HISTORY — DX: Alcohol induced acute pancreatitis without necrosis or infection: K85.20

## 2022-06-15 HISTORY — DX: Acquired absence of spleen: Z90.81

## 2022-06-15 LAB — CBC WITH DIFFERENTIAL/PLATELET
Abs Immature Granulocytes: 0.06 10*3/uL (ref 0.00–0.07)
Basophils Absolute: 0.1 10*3/uL (ref 0.0–0.1)
Basophils Relative: 1 %
Eosinophils Absolute: 0.3 10*3/uL (ref 0.0–0.5)
Eosinophils Relative: 2 %
HCT: 44 % (ref 39.0–52.0)
Hemoglobin: 14.7 g/dL (ref 13.0–17.0)
Immature Granulocytes: 1 %
Lymphocytes Relative: 28 %
Lymphs Abs: 3.4 10*3/uL (ref 0.7–4.0)
MCH: 32.9 pg (ref 26.0–34.0)
MCHC: 33.4 g/dL (ref 30.0–36.0)
MCV: 98.4 fL (ref 80.0–100.0)
Monocytes Absolute: 1.3 10*3/uL — ABNORMAL HIGH (ref 0.1–1.0)
Monocytes Relative: 10 %
Neutro Abs: 7 10*3/uL (ref 1.7–7.7)
Neutrophils Relative %: 58 %
Platelets: 223 10*3/uL (ref 150–400)
RBC: 4.47 MIL/uL (ref 4.22–5.81)
RDW: 13.7 % (ref 11.5–15.5)
WBC: 12.1 10*3/uL — ABNORMAL HIGH (ref 4.0–10.5)
nRBC: 0 % (ref 0.0–0.2)

## 2022-06-15 LAB — COMPREHENSIVE METABOLIC PANEL
ALT: 32 U/L (ref 0–44)
AST: 48 U/L — ABNORMAL HIGH (ref 15–41)
Albumin: 3.4 g/dL — ABNORMAL LOW (ref 3.5–5.0)
Alkaline Phosphatase: 147 U/L — ABNORMAL HIGH (ref 38–126)
Anion gap: 10 (ref 5–15)
BUN: 16 mg/dL (ref 6–20)
CO2: 28 mmol/L (ref 22–32)
Calcium: 8.8 mg/dL — ABNORMAL LOW (ref 8.9–10.3)
Chloride: 102 mmol/L (ref 98–111)
Creatinine, Ser: 1.13 mg/dL (ref 0.61–1.24)
GFR, Estimated: >60 mL/min (ref >60)
Glucose, Bld: 147 mg/dL — ABNORMAL HIGH (ref 70–99)
Potassium: 4 mmol/L (ref 3.5–5.1)
Sodium: 140 mmol/L (ref 135–145)
Total Bilirubin: 0.6 mg/dL (ref 0.3–1.2)
Total Protein: 6 g/dL — ABNORMAL LOW (ref 6.5–8.1)

## 2022-06-15 LAB — LIPID PANEL
Cholesterol: 98 mg/dL (ref 0–200)
HDL: 48 mg/dL (ref >40)
LDL Cholesterol: 38 mg/dL (ref 0–99)
Total CHOL/HDL Ratio: 2 RATIO
Triglycerides: 58 mg/dL (ref <150)
VLDL: 12 mg/dL (ref 0–40)

## 2022-06-15 LAB — MRSA NEXT GEN BY PCR, NASAL
MRSA by PCR Next Gen: NOT DETECTED
MRSA by PCR Next Gen: NOT DETECTED

## 2022-06-15 LAB — PROTIME-INR
INR: 1.1 (ref 0.8–1.2)
Prothrombin Time: 14.4 seconds (ref 11.4–15.2)

## 2022-06-15 LAB — GLUCOSE, CAPILLARY
Glucose-Capillary: 203 mg/dL — ABNORMAL HIGH (ref 70–99)
Glucose-Capillary: 216 mg/dL — ABNORMAL HIGH (ref 70–99)

## 2022-06-15 LAB — APTT: aPTT: 27 seconds (ref 24–36)

## 2022-06-15 LAB — TROPONIN I (HIGH SENSITIVITY)
Troponin I (High Sensitivity): 21 ng/L — ABNORMAL HIGH (ref <18)
Troponin I (High Sensitivity): 101 ng/L (ref <18)

## 2022-06-15 SURGERY — LEFT HEART CATH AND CORONARY ANGIOGRAPHY
Anesthesia: Moderate Sedation

## 2022-06-15 MED ORDER — INSULIN GLARGINE-YFGN 100 UNIT/ML ~~LOC~~ SOLN
6.0000 [IU] | Freq: Every day | SUBCUTANEOUS | Status: DC
  Administered 2022-06-15 – 2022-06-24 (×10): 6 [IU] via SUBCUTANEOUS
  Filled 2022-06-15 (×13): qty 0.06

## 2022-06-15 MED ORDER — ASPIRIN 81 MG PO TBEC
81.0000 mg | DELAYED_RELEASE_TABLET | Freq: Every day | ORAL | Status: DC
  Administered 2022-06-16 – 2022-06-27 (×11): 81 mg via ORAL
  Filled 2022-06-15 (×11): qty 1

## 2022-06-15 MED ORDER — HEPARIN SODIUM (PORCINE) 1000 UNIT/ML IJ SOLN
INTRAMUSCULAR | Status: DC | PRN
  Administered 2022-06-15 (×2): 3000 [IU] via INTRAVENOUS

## 2022-06-15 MED ORDER — SODIUM CHLORIDE 0.9 % IV SOLN
INTRAVENOUS | Status: AC | PRN
  Administered 2022-06-15: 75 mL/h via INTRAVENOUS

## 2022-06-15 MED ORDER — SODIUM CHLORIDE 0.9% FLUSH: 3.0000 mL | INTRAVENOUS | Status: DC | PRN

## 2022-06-15 MED ORDER — VERAPAMIL HCL 2.5 MG/ML IV SOLN
INTRAVENOUS | Status: AC
  Filled 2022-06-15: qty 2

## 2022-06-15 MED ORDER — INSULIN GLARGINE-YFGN 100 UNIT/ML ~~LOC~~ SOLN
12.0000 [IU] | Freq: Every day | SUBCUTANEOUS | Status: DC
  Filled 2022-06-15: qty 0.12

## 2022-06-15 MED ORDER — ONDANSETRON HCL 4 MG/2ML IJ SOLN: 4.0000 mg | Freq: Four times a day (QID) | INTRAMUSCULAR | Status: DC | PRN

## 2022-06-15 MED ORDER — NICOTINE 21 MG/24HR TD PT24
21.0000 mg | MEDICATED_PATCH | Freq: Every day | TRANSDERMAL | Status: DC
  Administered 2022-06-15: 21 mg via TRANSDERMAL
  Filled 2022-06-15: qty 1

## 2022-06-15 MED ORDER — METOPROLOL TARTRATE 25 MG PO TABS: 12.5000 mg | ORAL_TABLET | Freq: Two times a day (BID) | ORAL | Status: DC

## 2022-06-15 MED ORDER — HEPARIN (PORCINE) IN NACL 1000-0.9 UT/500ML-% IV SOLN
INTRAVENOUS | Status: DC | PRN
  Administered 2022-06-15 (×2): 500 mL

## 2022-06-15 MED ORDER — MIDAZOLAM HCL 2 MG/2ML IJ SOLN
INTRAMUSCULAR | Status: AC
  Filled 2022-06-15: qty 2

## 2022-06-15 MED ORDER — SODIUM CHLORIDE 0.9 % IV SOLN: INTRAVENOUS | Status: DC

## 2022-06-15 MED ORDER — ACETAMINOPHEN 325 MG PO TABS: 650.0000 mg | ORAL_TABLET | ORAL | Status: DC | PRN

## 2022-06-15 MED ORDER — ORAL CARE MOUTH RINSE: 15.0000 mL | OROMUCOSAL | Status: DC | PRN

## 2022-06-15 MED ORDER — ONDANSETRON HCL 4 MG/2ML IJ SOLN
4.0000 mg | Freq: Four times a day (QID) | INTRAMUSCULAR | Status: DC | PRN
  Administered 2022-06-19: 4 mg via INTRAVENOUS
  Filled 2022-06-15 (×2): qty 2

## 2022-06-15 MED ORDER — HEPARIN SODIUM (PORCINE) 1000 UNIT/ML IJ SOLN
INTRAMUSCULAR | Status: AC
  Filled 2022-06-15: qty 10

## 2022-06-15 MED ORDER — HEPARIN (PORCINE) IN NACL 1000-0.9 UT/500ML-% IV SOLN
INTRAVENOUS | Status: AC
  Filled 2022-06-15: qty 1000

## 2022-06-15 MED ORDER — HEPARIN (PORCINE) 25000 UT/250ML-% IV SOLN: 700.0000 [IU]/h | INTRAVENOUS | Status: DC

## 2022-06-15 MED ORDER — VERAPAMIL HCL 2.5 MG/ML IV SOLN
INTRAVENOUS | Status: DC | PRN
  Administered 2022-06-15 (×2): 2.5 mg via INTRA_ARTERIAL

## 2022-06-15 MED ORDER — BUPRENORPHINE HCL-NALOXONE HCL 8-2 MG SL SUBL
1.0000 | SUBLINGUAL_TABLET | Freq: Every day | SUBLINGUAL | Status: DC
  Administered 2022-06-16 – 2022-06-27 (×12): 1 via SUBLINGUAL
  Filled 2022-06-15 (×12): qty 1

## 2022-06-15 MED ORDER — PANCRELIPASE (LIP-PROT-AMYL) 36000-114000 UNITS PO CPEP: 72000.0000 [IU] | ORAL_CAPSULE | Freq: Three times a day (TID) | ORAL | Status: DC

## 2022-06-15 MED ORDER — HYDRALAZINE HCL 20 MG/ML IJ SOLN: 10.0000 mg | INTRAMUSCULAR | Status: DC | PRN

## 2022-06-15 MED ORDER — CHLORHEXIDINE GLUCONATE CLOTH 2 % EX PADS
6.0000 | MEDICATED_PAD | Freq: Every day | CUTANEOUS | Status: DC
  Administered 2022-06-15 – 2022-06-22 (×5): 6 via TOPICAL

## 2022-06-15 MED ORDER — ATORVASTATIN CALCIUM 80 MG PO TABS
80.0000 mg | ORAL_TABLET | Freq: Every day | ORAL | Status: DC
  Administered 2022-06-16 – 2022-07-05 (×19): 80 mg via ORAL
  Filled 2022-06-15 (×19): qty 1

## 2022-06-15 MED ORDER — PANCRELIPASE (LIP-PROT-AMYL) 36000-114000 UNITS PO CPEP
72000.0000 [IU] | ORAL_CAPSULE | Freq: Three times a day (TID) | ORAL | Status: DC
  Administered 2022-06-15 – 2022-06-27 (×25): 72000 [IU] via ORAL
  Filled 2022-06-15 (×43): qty 2

## 2022-06-15 MED ORDER — INSULIN ASPART 100 UNIT/ML IJ SOLN
0.0000 [IU] | Freq: Three times a day (TID) | INTRAMUSCULAR | Status: DC
  Administered 2022-06-15 – 2022-06-17 (×5): 2 [IU] via SUBCUTANEOUS
  Administered 2022-06-18: 3 [IU] via SUBCUTANEOUS
  Administered 2022-06-18: 1 [IU] via SUBCUTANEOUS
  Administered 2022-06-19: 2 [IU] via SUBCUTANEOUS
  Administered 2022-06-19: 4 [IU] via SUBCUTANEOUS
  Administered 2022-06-20: 1 [IU] via SUBCUTANEOUS

## 2022-06-15 MED ORDER — HEPARIN (PORCINE) 25000 UT/250ML-% IV SOLN
1100.0000 [IU]/h | INTRAVENOUS | Status: AC
  Administered 2022-06-15: 700 [IU]/h via INTRAVENOUS
  Administered 2022-06-17 – 2022-06-20 (×5): 1100 [IU]/h via INTRAVENOUS
  Filled 2022-06-15 (×8): qty 250

## 2022-06-15 MED ORDER — INSULIN ASPART 100 UNIT/ML IJ SOLN: 0.0000 [IU] | Freq: Three times a day (TID) | INTRAMUSCULAR | Status: DC

## 2022-06-15 MED ORDER — FENTANYL CITRATE (PF) 100 MCG/2ML IJ SOLN
INTRAMUSCULAR | Status: AC
  Filled 2022-06-15: qty 2

## 2022-06-15 MED ORDER — METOPROLOL SUCCINATE ER 25 MG PO TB24
12.5000 mg | ORAL_TABLET | Freq: Every day | ORAL | Status: DC
  Administered 2022-06-17 – 2022-06-27 (×11): 12.5 mg via ORAL
  Filled 2022-06-15 (×11): qty 1

## 2022-06-15 MED ORDER — SODIUM CHLORIDE 0.9% FLUSH: 3.0000 mL | Freq: Two times a day (BID) | INTRAVENOUS | Status: DC

## 2022-06-15 MED ORDER — FENTANYL CITRATE (PF) 100 MCG/2ML IJ SOLN
INTRAMUSCULAR | Status: DC | PRN
  Administered 2022-06-15: 12.5 ug via INTRAVENOUS

## 2022-06-15 MED ORDER — SODIUM CHLORIDE 0.9 % IV SOLN: 250.0000 mL | INTRAVENOUS | Status: DC | PRN

## 2022-06-15 MED ORDER — MIDAZOLAM HCL 2 MG/2ML IJ SOLN
INTRAMUSCULAR | Status: DC | PRN
  Administered 2022-06-15: .5 mg via INTRAVENOUS

## 2022-06-15 MED ORDER — CHLORHEXIDINE GLUCONATE CLOTH 2 % EX PADS
6.0000 | MEDICATED_PAD | Freq: Every day | CUTANEOUS | Status: DC
  Administered 2022-06-15: 6 via TOPICAL

## 2022-06-15 MED ORDER — NITROGLYCERIN 0.4 MG SL SUBL: 0.4000 mg | SUBLINGUAL_TABLET | SUBLINGUAL | Status: DC | PRN

## 2022-06-15 MED ORDER — IOHEXOL 300 MG/ML  SOLN
INTRAMUSCULAR | Status: DC | PRN
  Administered 2022-06-15: 75 mL

## 2022-06-15 SURGICAL SUPPLY — 14 items
CATH 5F 110X4 TIG (CATHETERS) IMPLANT
CATH INFINITI 5FR ANG PIGTAIL (CATHETERS) IMPLANT
DEVICE RAD TR BAND REGULAR (VASCULAR PRODUCTS) IMPLANT
DRAPE BRACHIAL (DRAPES) IMPLANT
GLIDESHEATH SLEND SS 6F .021 (SHEATH) IMPLANT
GUIDEWIRE INQWIRE 1.5J.035X260 (WIRE) IMPLANT
INQWIRE 1.5J .035X260CM (WIRE) ×1
KIT ENCORE 26 ADVANTAGE (KITS) IMPLANT
PACK CARDIAC CATH (CUSTOM PROCEDURE TRAY) ×1 IMPLANT
PROTECTION STATION PRESSURIZED (MISCELLANEOUS) ×1
SET ATX-X65L (MISCELLANEOUS) IMPLANT
SHEATH GLIDE SLENDER 4/5FR (SHEATH) IMPLANT
STATION PROTECTION PRESSURIZED (MISCELLANEOUS) IMPLANT
WIRE HITORQ VERSACORE ST 145CM (WIRE) IMPLANT

## 2022-06-15 NOTE — ED Notes (Signed)
RN attempted to call pt wife Alyse Low at number listed. Left voicemail to call back.

## 2022-06-15 NOTE — Consult Note (Signed)
Cardiology Consultation:   Patient ID: Jancarlo Stampley; KT:[id number]; [birth year]   Admit date: 06/15/2022 Date of Consult: 06/15/2022  Primary Care Provider: Jearld Fenton, NP Primary Cardiologist: New - consult by End Primary Electrophysiologist:  None   Patient Profile:   Ian Gross is a 47 y.o. 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 Cherylann Banas.  History of Present Illness:   Ian Gross previous underwent treadmill stress echo in 03/2016 that showed no echocardiographic evidence of inducible myocardial ischemia with overall good exercise capacity.  He denies any previously known cardiac history.  Earlier this morning, he began having central chest pain that was described as a pressure/squeezing that radiated into his bilateral arms with associated paresthesias around 10 AM with associated nausea.  He went to lay down and take a bath with noted improvement in pain shortly thereafter.  However, around 1500 the pain returned and was worse prompting him to contact EMS.  Initial EKG in the field showed sinus rhythm, 70 bpm, anterior  ST elevation.  Repeat EKG in the field again shows sinus rhythm, 69 bpm, anterior ST elevation with reciprocal lateral changes concerning for MI.  The patient received 324 ASA and 2 sprays of SL NTG.  Upon arrival to [facility] repeat EKG showed sinus rhythm with improvement, though not resolution, in the anterior lead ST elevation.  At time of cardiology evaluation, patient was chest pain-free.  Initial high-sensitivity troponin 21.  WBC 12.1, Hgb 14.7, PLT 223, potassium 4.0, glucose 147, BUN 16, serum creatinine 1.13, albumin 3.4, AST 48, ALT normal, alkaline phosphatase 147.  Given concern for ST  segment elevation MI on EMS EKG with concern for possible unstable plaque in the LAD leading to his intermittent angina and dynamic EKG changes, and in the setting of risk factors including diabetes mellitus and tobacco/marijuana use, the patient was taken emergently to the cardiac cath lab with further recommendations pending results.    Past Medical History:  Diagnosis Date   Diabetes mellitus with complication [facility])     Past Surgical History:  Procedure Laterality Date   PANCREATECTOMY N/A    SPLENECTOMY       Home Meds: Prior to Admission medications   Medication Sig Start Date End Date Taking? Authorizing Provider  Buprenorphine HCl-Naloxone HCl 8-2 MG FILM Place 1 strip under the tongue 2 (two) times daily. 11/15/20   [provider]  HUMALOG KWIKPEN 100 UNIT/ML KwikPen Inject 4-9 Units into the skin 3 (three) times daily. Sliding scale (go up by 1 unit) 12/28/20   [provider]  insulin glargine (LANTUS) 100 UNIT/ML injection Inject 12 Units into the skin at bedtime.    [provider]  Pancrelipase, Lip-Prot-Amyl, (CREON) 24000-76000 units CPEP Take 3 capsules (72,000 Units total) by mouth 3 (three) times daily. 03/11/22   Jearld Fenton, NP    Inpatient Medications: Scheduled Meds:  Continuous Infusions:  PRN Meds: fentaNYL, Heparin (Porcine) in NaCl, heparin sodium (porcine), midazolam, verapamil  Allergies:  No Known Allergies  Social History:   Social History   Socioeconomic History   Marital status: Significant Other    Spouse name: Not on file   Number of children: 2   Years of education: Not on file   Highest education level: Not on file  Occupational History   Occupation: Disable  Tobacco Use   Smoking status: Every Day    Packs/day: .75    Types: Cigarettes   Smokeless tobacco: Never  Vaping Use   Vaping Use: Never used  Substance and Sexual Activity   Alcohol use: Not Currently   Drug use: Not Currently   Sexual  activity: Not on file  Other Topics Concern   Not on file  Social History Narrative   Not on file   Social Determinants of Health   Financial Resource Strain: Low Risk  (09/03/2021)   Overall Financial Resource Strain (CARDIA)    Difficulty of Paying Living Expenses: Not hard at all  Food Insecurity: No Food Insecurity (09/03/2021)   Hunger Vital Sign    Worried About Running Out of Food in the Last Year: Never true    Ran Out of Food in the Last Year: Never true  Transportation Needs: No Transportation Needs (09/03/2021)   PRAPARE - Hydrologist (Medical): No    Lack of Transportation (Non-Medical): No  Physical Activity: Not on file  Stress: No Stress Concern Present (09/03/2021)   [place]    Feeling of Stress : Only a little  Social Connections: Moderately Isolated (09/03/2021)   Social Connection and Isolation Panel [NHANES]    Frequency of Communication with Friends and Family: Twice a week    Frequency of Social Gatherings with Friends and Family: More than three times a week    Attends Religious Services: Never    Marine scientist or Organizations: No    Attends Music therapist: Not on file    Marital Status: Living with partner  Intimate Partner Violence: Not At Risk (09/03/2021)   Humiliation, Afraid, Rape, and Kick questionnaire    Fear of Current or Ex-Partner: No    Emotionally Abused: No    Physically Abused: No    Sexually Abused: No     Family History:   Family History  Problem Relation Age of Onset   Lung cancer Father    Healthy Sister    Healthy Brother    CVA Maternal Grandmother    Liver cancer Maternal Grandfather    Alcohol abuse Maternal Grandfather     ROS:  Review of Systems  Constitutional:  Positive for malaise/fatigue. Negative for chills, diaphoresis, fever and weight loss.  HENT:  Negative for congestion.   Eyes:  Negative for  discharge and redness.  Respiratory:  Negative for cough, sputum production, shortness of breath and wheezing.   Cardiovascular:  Positive for chest pain. Negative for palpitations, orthopnea, claudication, leg swelling and PND.  Gastrointestinal:  Negative for abdominal pain, heartburn, nausea and vomiting.  Musculoskeletal:  Negative for falls and myalgias.  Skin:  Negative for rash.  Neurological:  Negative for dizziness, tingling, tremors, sensory change, speech change, focal weakness, loss of consciousness and weakness.  Endo/Heme/Allergies:  Does not bruise/bleed easily.  Psychiatric/Behavioral:  Negative for substance abuse. The patient is not nervous/anxious.   All other systems reviewed and are negative.     Physical Exam/Data:   Vitals:   06/15/22 1555 06/15/22 1609  BP: 125/74   Pulse: 66   Resp: 17   Temp: 98 F (36.7 C)   SpO2: 100% 100%  Weight: 61.2 kg   Height: 6' (1.829 m)    No intake or output data in the 24 hours ending 06/15/22 [place]  06/15/22 1555  Weight: 61.2 kg   Body mass index is 18.31 kg/m.   Physical Exam: General: Well developed, well nourished, in no acute distress. Head: Normocephalic, atraumatic, sclera non-icteric, no xanthomas, nares without discharge.  Neck: Negative for carotid bruits. JVD not elevated. Lungs: Clear bilaterally to auscultation without wheezes, rales, or rhonchi. Breathing is unlabored. Heart: RRR with S1 S2. No murmurs, rubs, or gallops appreciated. Abdomen: Soft, non-tender, non-distended with normoactive bowel sounds. No hepatomegaly. No rebound/guarding. No obvious abdominal masses. Msk:  Strength and tone appear normal for age. Extremities: No clubbing or cyanosis. No edema. Distal pedal pulses are 2+ and equal bilaterally. Neuro: Alert and oriented X 3. No facial asymmetry. No focal deficit. Moves all extremities spontaneously. Psych:  Responds to questions appropriately with a normal affect.   EKG:   The EKG was personally reviewed and demonstrates: Initial EKG in the field showed sinus rhythm, 70 bpm, anterior  ST elevation.  Repeat EKG in the field again shows sinus rhythm, 69 bpm, anterior ST elevation with reciprocal lateral changes concerning for MI.  [facility] ED EKG showed sinus rhythm with improvement, though not resolution, in the anterior lead ST elevation Telemetry:  Telemetry was personally reviewed and demonstrates: SR  Weights: [employer]   06/15/22 1555  Weight: 61.2 kg    Relevant CV Studies:  Stress echo 04/02/2016 [facility]):  Negative exercise stress echocardiogram; there is no echocardiographic  evidence of inducible myocardial ischemia.   Negative ETT for ischemia at workload achieved   No significant ST segment changes or arrhythmias were noted during  stress   No significant chest pain symptoms reported   Overall, the patient's exercise capacity was good.   Laboratory Data:  ChemistryNo results for input(s): "NA", "K", "CL", "CO2", "GLUCOSE", "BUN", "CREATININE", "CALCIUM", "GFRNONAA", "GFRAA", "ANIONGAP" in the last 168 hours.  No results for input(s): "PROT", "ALBUMIN", "AST", "ALT", "ALKPHOS", "BILITOT" in the last 168 hours. Hematology Recent Labs  Lab 06/15/22 1553  WBC 12.1*  RBC 4.47  HGB 14.7  HCT 44.0  MCV 98.4  MCH 32.9  MCHC 33.4  RDW 13.7  PLT 223   Cardiac EnzymesNo results for input(s): "TROPONINI" in the last 168 hours. No results for input(s): "TROPIPOC" in the last 168 hours.  BNPNo results for input(s): "BNP", "PROBNP" in the last 168 hours.  DDimer No results for input(s): "DDIMER" in the last 168 hours.  Radiology/Studies:  No results found.  Assessment and Plan:   1.  Urgent NSTEMI: -Chest pain-free at time of cardiology evaluation -Initial high-sensitivity troponin 21 with delta troponin pending -There is concern for ST segment elevation MI on EMS EKG with possible unstable plaque in the LAD leading to his intermittent  angina and dynamic EKG changes with known risk factors including diabetes mellitus and tobacco/marijuana use, the patient was taken emergently to the cardiac cath lab with further recommendations pending results -Obtain echo -Monitor on telemetry -LDL 38 this admission -Obtain A1c   2.  DM s/p pancreatectomy with history of alcohol induced chronic pancreatitis: -Management per internal medicine  3.  Ongoing tobacco/marijuana use: -Obtain urine drug screen -Complete cessation is recommended   Shared Decision Making/Informed Consent{  The risks [stroke (1 in 1000), death (1 in 1000), kidney failure [usually temporary] (1 in 500), bleeding (1 in 200), allergic reaction [possibly serious] (1 in 200)], benefits (diagnostic support and management of coronary artery disease) and alternatives of a cardiac catheterization were discussed in detail with Ian Gross and he is willing to  proceed.    For questions or updates, please contact [facility] Please consult [email or web address] for contact info under Cardiology/STEMI.   Signed, Christell Faith, PA-C [facility] Pager: [phone] 06/15/2022, 4:32 PM

## 2022-06-15 NOTE — Significant Event (Signed)
[facility] STEMI Evaluation  Patient presents via EMS with intermittent chest pain and nausea that began ~10 AM.  When EMS arrived, he was found to have anterior ST elevation by EKG.  He received aspirin and NTG with gradual resolution of chest pain.  Repeat EKG in ED shows non-specific ST changes.  Risk factors include DM and tobacco/marijuana use.  Given typical symptoms and concerning EMS EKG, we discussed proceeding with emergent catheterization versus medical therapy and continued monitoring in the ED.  I am concerned that Ian Gross may have an unstable plaque in the LAD leading to his intermittent chest pain and dynamic EKG changes.  He has agreed to proceed with left heart catheterization and possible PCI.  Shared Decision Making/Informed Consent The risks [stroke (1 in 1000), death (1 in 1000), kidney failure [usually temporary] (1 in 500), bleeding (1 in 200), allergic reaction [possibly serious] (1 in 200)], benefits (diagnostic support and management of coronary artery disease) and alternatives of a cardiac catheterization were discussed in detail with Mr. Lisonbee and he is willing to proceed; he provided emergent verbal consent.  Nelva Bush, MD [facility] 06/15/22 4:07 PM

## 2022-06-15 NOTE — Progress Notes (Signed)
Patient left with [facility] to go to [facility]. Report previously called. Family here at time of transfer.

## 2022-06-15 NOTE — H&P (Incomplete)
Cardiology Admission History and Physical   Patient ID: Ian Gross MRN: [id number]; DOB: 11/08/1975   Admission date: 06/15/2022  PCP:  Jearld Fenton, NP   [place] Providers Cardiologist:  New to Dr End    Chief Complaint:  chest pain   Patient Profile:   Ian Gross is a 47 y.o. 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  History of Present Illness:   Mr. Ian Gross with above past medical history presented to [facility] complaining chest discomfort radiating to both arms.  Pain started around 10 AM today.  He reported associated nausea.  Symptoms lasted for few hours.  He ultimately called 911.  EMS found him to have anterior ST elevation on EKG. he was given aspirin and nitroglycerin with subsequent resolution of chest pain.  He was chest pain-free upon arrival to [facility].  Repeat EKG revealed nonspecific ST changes.    Urgent cardiac catheterization was performed by Dr. Saunders Revel which revealed severe proximal LAD disease involving a moderate-caliber first diagonal branch and extending back to the ostium. Due to close proximity to ramus intermedius and left circumflex, it was felt that PCI was high risk and that the patient may be better served by CABG. Therefore, intervention was not performed.  LV gram showed mildly reduced left ventricular systolic function with mild mid and apical anterior and apical inferior hypokinesis (LVEF 45-50%). LVEDP 6 mmHg.  He was transferred to [facility] for CT surgery evaluation for urgent CABG.   Additional diagnostic workup today at [facility] revealed creatinine 1.13, alk phos 147, albumin 3.4, AST 48.  High sensitive troponin 21. WBC 12100. INR 1.1.  HDL 48, LDL 38, triglyceride  58.   Past Medical History:  Diagnosis Date   Diabetes mellitus with complication [facility])     Past Surgical History:  Procedure Laterality Date   PANCREATECTOMY N/A    SPLENECTOMY       Medications Prior to Admission: Prior to Admission medications   Medication Sig Start Date End Date Taking? Authorizing Provider  Buprenorphine HCl-Naloxone HCl 8-2 MG FILM Place 1 strip under the tongue 2 (two) times daily. 11/15/20   [provider]  HUMALOG KWIKPEN 100 UNIT/ML KwikPen Inject 4-9 Units into the skin 3 (three) times daily. Sliding scale (go up by 1 unit) 12/28/20   [provider]  insulin glargine (LANTUS) 100 UNIT/ML injection Inject 12 Units into the skin at bedtime.    [provider]  Pancrelipase, Lip-Prot-Amyl, (CREON) 24000-76000 units CPEP Take 3 capsules (72,000 Units total) by mouth 3 (three) times daily. 03/11/22   Jearld Fenton, NP     Allergies:   No Known Allergies  Social History:   Social History   Socioeconomic History   Marital status: Significant Other    Spouse name: Not on file   Number of children: 2   Years of education: Not on file   Highest education level: Not on file  Occupational History   Occupation: Disable  Tobacco Use   Smoking status: Every Day    Packs/day: .75    Types: Cigarettes   Smokeless tobacco: Never  Vaping Use   Vaping Use: Never used  Substance and Sexual Activity   Alcohol use: Not Currently   Drug use: Not Currently   Sexual activity: Not on file  Other  Topics Concern   Not on file  Social History Narrative   Not on file   Social Determinants of Health   Financial Resource Strain: Low Risk  (09/03/2021)   Overall Financial Resource Strain (CARDIA)    Difficulty of Paying Living Expenses: Not hard at all  Food Insecurity: No Food Insecurity (09/03/2021)   Hunger Vital Sign    Worried About Running Out of Food in the Last Year: Never true    Ran Out of Food in the Last Year: Never true   Transportation Needs: No Transportation Needs (09/03/2021)   PRAPARE - Hydrologist (Medical): No    Lack of Transportation (Non-Medical): No  Physical Activity: Not on file  Stress: No Stress Concern Present (09/03/2021)   [place]    Feeling of Stress : Only a little  Social Connections: Moderately Isolated (09/03/2021)   Social Connection and Isolation Panel [NHANES]    Frequency of Communication with Friends and Family: Twice a week    Frequency of Social Gatherings with Friends and Family: More than three times a week    Attends Religious Services: Never    Marine scientist or Organizations: No    Attends Music therapist: Not on file    Marital Status: Living with partner  Intimate Partner Violence: Not At Risk (09/03/2021)   Humiliation, Afraid, Rape, and Kick questionnaire    Fear of Current or Ex-Partner: No    Emotionally Abused: No    Physically Abused: No    Sexually Abused: No    Family History:   The patient's family history includes Alcohol abuse in his maternal grandfather; CVA in his maternal grandmother; Healthy in his brother and sister; Liver cancer in his maternal grandfather; Lung cancer in his father.    ROS:  Please see the history of present illness.  ***All other ROS reviewed and negative.     Physical Exam/Data:   Vitals:   06/15/22 1718 06/15/22 1720 06/15/22 1723 06/15/22 1745  BP:  113/60 112/62 (!) 112/53  Pulse: 71 70 68 79  Resp: 15 13 10 14   Temp:    98.6 F (37 C)  TempSrc:    Oral  SpO2: 100% 100% 100% 99%  Weight:    61.3 kg  Height:    6' (1.829 m)   No intake or output data in the 24 hours ending 06/15/22 1803    06/15/2022    5:45 PM 06/15/2022    3:55 PM 12/09/2021   10:38 AM  Last 3 Weights  Weight (lbs) 135 lb 2.3 oz 135 lb 129 lb  Weight (kg) 61.3 kg 61.236 kg 58.514 kg     Body mass index is 18.33 kg/m.    General:  Well nourished, well developed, in no acute distress*** HEENT: normal Neck: no*** JVD Vascular: No carotid bruits; Distal pulses 2+ bilaterally   Cardiac:  normal S1, S2; RRR; no murmur *** Lungs:  clear to auscultation bilaterally, no wheezing, rhonchi or rales  Abd: soft, nontender, no hepatomegaly  Ext: no*** edema Musculoskeletal:  No deformities, BUE and BLE strength normal and equal Skin: warm and dry  Neuro:  CNs 2-12 intact, no focal abnormalities noted Psych:  Normal affect    EKG:  The ECG that was done *** was personally reviewed and demonstrates ***  Relevant CV Studies:   LHC 06/15/22:  Conclusions: Severe complex ostial/proximal LAD involving moderate-caliber D1 that is  difficult to visualize due to vessel overlap.  Lesion appears to be an acute plaque rupture with up to 90% stenosis involving the LAD and 75% stenosis involving D1 leading to the patient's NSTEMI.  LMCA, ramus intermedius, LCx, and RCA are without significant disease. Mildly reduced left ventricular systolic function with mild mid and apical anterior and apical inferior hypokinesis (LVEF 45-50%). Normal left ventricular filling pressure (LVEDP 6 mmHg).   Recommendations: Case reviewed with Dr. Ellyn Hack (interventional cardiology) and Dr. Kipp Brood (cardiothoracic surgery).  Given proximal LAD disease involving sizeable D1 and adjacent ramus intermedius branch, PCI would be high risk.  In the setting of his DM and LAD/D1 disease, we have agreed that CABG may be the best revascularization option.  Given that Mr. Merenda is currently chest pain free and hemodynamically stable, we will transfer him to [facility] for urgent evaluation and possible CABG as soon as tomorrow. Start IV heparin 2 hours after TR band deflation. High-intensity statin therapy. Aspirin 81 mg daily; defer P2Y12 inhibitor pending cardiac surgery evaluation. Start metoprolol tartrate 12.5 mg twice daily.   Laboratory  Data:  High Sensitivity Troponin:   Recent Labs  Lab 06/15/22 1553  TROPONINIHS 21*      Chemistry Recent Labs  Lab 06/15/22 1553  NA 140  K 4.0  CL 102  CO2 28  GLUCOSE 147*  BUN 16  CREATININE 1.13  CALCIUM 8.8*  GFRNONAA >60  ANIONGAP 10    Recent Labs  Lab 06/15/22 1553  PROT 6.0*  ALBUMIN 3.4*  AST 48*  ALT 32  ALKPHOS 147*  BILITOT 0.6   Lipids  Recent Labs  Lab 06/15/22 1553  CHOL 98  TRIG 58  HDL 48  LDLCALC 38  CHOLHDL 2.0   Hematology Recent Labs  Lab 06/15/22 1553  WBC 12.1*  RBC 4.47  HGB 14.7  HCT 44.0  MCV 98.4  MCH 32.9  MCHC 33.4  RDW 13.7  PLT 223   Thyroid No results for input(s): "TSH", "FREET4" in the last 168 hours. BNPNo results for input(s): "BNP", "PROBNP" in the last 168 hours.  DDimer No results for input(s): "DDIMER" in the last 168 hours.   Radiology/Studies:  CARDIAC CATHETERIZATION  Result Date: 06/15/2022 Conclusions: Severe complex ostial/proximal LAD involving moderate-caliber D1 that is difficult to visualize due to vessel overlap.  Lesion appears to be an acute plaque rupture with up to 90% stenosis involving the LAD and 75% stenosis involving D1 leading to the patient's NSTEMI.  LMCA, ramus intermedius, LCx, and RCA are without significant disease. Mildly reduced left ventricular systolic function with mild mid and apical anterior and apical inferior hypokinesis (LVEF 45-50%). Normal left ventricular filling pressure (LVEDP 6 mmHg). Recommendations: Case reviewed with Dr. Ellyn Hack (interventional cardiology) and Dr. Kipp Brood (cardiothoracic surgery).  Given proximal LAD disease involving sizeable D1 and adjacent ramus intermedius branch, PCI would be high risk.  In the setting of his DM and LAD/D1 disease, we have agreed that CABG may be the best revascularization option.  Given that Mr. Brakeman is currently chest pain free and hemodynamically stable, we will transfer him to [facility] for urgent evaluation and  possible CABG as soon as tomorrow. Start IV heparin 2 hours after TR band deflation. High-intensity statin therapy. Aspirin 81 mg daily; defer P2Y12 inhibitor pending cardiac surgery evaluation. Start metoprolol tartrate 12.5 mg twice daily. Nelva Bush, MD [facility]    Assessment and Plan:   Anterior STEMI  Severe proximal LAD disease - presented with chest  pain, sent to [facility] for STEMI code, emergent LHC today showed severe proximal LAD disease involving a moderate-caliber first diagonal branch and extending back to the ostium. Due to close proximity to ramus intermedius and left circumflex, it was felt that PCI was high risk and that the patient may be better served by CABG. Transferred here for CTS consult. - LDL 38, will check LPA - check A1C and TSH and Echo  - admit to ICU , continuous telemetry monitor  - NPO after midnight - continue ASA 81mg  daily, resume heparin gtt 2 hours after TR band deflation , start lipitor 80mg  daily and  metoprolol XL 12.5mg  daily  - check Echo  - CTS consult has been requested   Hypertension -Blood pressure overall stable  Secondary insulin-dependent diabetes after pancreatectomy  Hx of alcoholic pancreatitis Chronic pain syndrome History of splenectomy -Consult hospital medicine regarding management  Risk Assessment/Risk Scores:     Severity of Illness: The appropriate patient status for this patient is INPATIENT. Inpatient status is judged to be reasonable and necessary in order to provide the required intensity of service to ensure the patient's safety. The patient's presenting symptoms, physical exam findings, and initial radiographic and laboratory data in the context of their chronic comorbidities is felt to place them at high risk for further clinical deterioration. Furthermore, it is not anticipated that the patient will be medically stable for discharge from the hospital within 2 midnights of admission.   * I certify that at the  point of admission it is my clinical judgment that the patient will require inpatient hospital care spanning beyond 2 midnights from the point of admission due to high intensity of service, high risk for further deterioration and high frequency of surveillance required.*   For questions or updates, please contact [place] Please consult [email or web address] for contact info under     Signed, Margie Billet, NP  06/15/2022 6:03 PM

## 2022-06-15 NOTE — Discharge Summary (Signed)
Discharge Summary    Patient ID: Ian Gross MRN: [id number]; DOB: 01-24-1976  Admit date: 06/15/2022 Discharge date: 06/15/2022  PCP:  Jearld Fenton, NP   [place] Providers Cardiologist:  New - Tiane Szydlowski     Discharge Diagnoses    Principal Problem:   NSTEMI (non-ST elevated myocardial infarction) [facility]) Active Problems:   Non-ST elevation (NSTEMI) myocardial infarction [facility])    Diagnostic Studies/Procedures    LHC (06/15/2022): Severe complex ostial/proximal LAD involving moderate-caliber D1 that is difficult to visualize due to vessel overlap.  Lesion appears to be an acute plaque rupture with up to 90% stenosis involving the LAD and 75% stenosis involving D1 leading to the patient's NSTEMI.  LMCA, ramus intermedius, LCx, and RCA are without significant disease. Mildly reduced left ventricular systolic function with mild mid and apical anterior and apical inferior hypokinesis (LVEF 45-50%). Normal left ventricular filling pressure (LVEDP 6 mmHg). _____________   History of Present Illness     Ian Gross is a 47 y.o. 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  Initial EKG when EMS arrived at his home showed anterior ST elevation, though this had resolved by the time he arrived at the ER.  He had been having waxing and waning chest pain and nausea much of the day.  Given concerning EKG findings, he was referred for emergent cardiac catheterization and possible PCI.  Hospital Course     Consultants: Cardiac surgery  Patient was taken for emergent cardiac catheterization, which showed severe complex proximal LAD disease involving D1 and extending back to the ostium.  There was concern that PCI could lead to abrupt vessel closure of the first  diagonal branch or ramus intermedius.  Case was reviewed with Dr. Ellyn Hack and Dr. Kipp Brood at [facility].  The decision was made to transfer to [facility] for possible CABG.  Patient has remained chest pain-free in the Cath Lab.  He was given heparin in the Cath Lab, within few patient which will be restarted 2 hours after TR band has been deflated.    Did the patient have an acute coronary syndrome (MI, NSTEMI, STEMI, etc) this admission?:  Yes                               AHA/ACC Clinical Performance & Quality Measures: N/A - urgent transfer to [facility] for cardiac surgery evaluation        _____________  Discharge Vitals Blood pressure 112/62, pulse 68, temperature 98 F (36.7 C), resp. rate 10, height 6' (1.829 m), weight 61.2 kg, SpO2 100 %.  Filed Weights   06/15/22 1555  Weight: 61.2 kg    Labs & Radiologic Studies    CBC Recent Labs    06/15/22 1553  WBC 12.1*  NEUTROABS 7.0  HGB 14.7  HCT 44.0  MCV 98.4  PLT [id number]   Basic Metabolic Panel Recent Labs    06/15/22 1553  NA 140  K 4.0  CL 102  CO2 28  GLUCOSE 147*  BUN 16  CREATININE 1.13  CALCIUM 8.8*   Liver Function Tests Recent Labs    06/15/22 1553  AST 48*  ALT 32  ALKPHOS 147*  BILITOT 0.6  PROT 6.0*  ALBUMIN 3.4*   No results for input(s): "LIPASE", "AMYLASE" in the  last 72 hours. High Sensitivity Troponin:   Recent Labs  Lab 06/15/22 1553  TROPONINIHS 21*    BNP Invalid input(s): "POCBNP" D-Dimer No results for input(s): "DDIMER" in the last 72 hours. Hemoglobin A1C No results for input(s): "HGBA1C" in the last 72 hours. Fasting Lipid Panel Recent Labs    06/15/22 1553  CHOL 98  HDL 48  LDLCALC 38  TRIG 58  CHOLHDL 2.0   Thyroid Function Tests No results for input(s): "TSH", "T4TOTAL", "T3FREE", "THYROIDAB" in the last 72 hours.  Invalid input(s): "FREET3" _____________  CARDIAC CATHETERIZATION  Result Date: 06/15/2022 Conclusions: Severe complex ostial/proximal  LAD involving moderate-caliber D1 that is difficult to visualize due to vessel overlap.  Lesion appears to be an acute plaque rupture with up to 90% stenosis involving the LAD and 75% stenosis involving D1 leading to the patient's NSTEMI.  LMCA, ramus intermedius, LCx, and RCA are without significant disease. Mildly reduced left ventricular systolic function with mild mid and apical anterior and apical inferior hypokinesis (LVEF 45-50%). Normal left ventricular filling pressure (LVEDP 6 mmHg). Recommendations: Case reviewed with Dr. Ellyn Hack (interventional cardiology) and Dr. Kipp Brood (cardiothoracic surgery).  Given proximal LAD disease involving sizeable D1 and adjacent ramus intermedius branch, PCI would be high risk.  In the setting of his DM and LAD/D1 disease, we have agreed that CABG may be the best revascularization option.  Given that Mr. Kindig is currently chest pain free and hemodynamically stable, we will transfer him to [facility] for urgent evaluation and possible CABG as soon as tomorrow. Start IV heparin 2 hours after TR band deflation. High-intensity statin therapy. Aspirin 81 mg daily; defer P2Y12 inhibitor pending cardiac surgery evaluation. Start metoprolol tartrate 12.5 mg twice daily. Nelva Bush, MD [facility]  Disposition   Pt is being transferred to [facility].  Follow-up Plans & Appointments     Discharge Instructions     AMB referral to Phase II Cardiac Rehabilitation   Complete by: As directed    Diagnosis: NSTEMI   After initial evaluation and assessments completed: Virtual Based Care may be provided alone or in conjunction with Phase 2 Cardiac Rehab based on patient barriers.: Yes   Intensive Cardiac Rehabilitation (ICR) [place] location only OR Traditional Cardiac Rehabilitation (TCR) *If criteria for ICR are not met will enroll in TCR [facility] only): Yes        Discharge Medications   N/A    Outstanding Labs/Studies   None  Duration of Discharge  Encounter   Greater than 30 minutes including physician time.  Signed, Nelva Bush, MD 06/15/2022, 5:47 PM

## 2022-06-15 NOTE — Progress Notes (Signed)
2100: Dr. Foye Clock, cardiology, to bedside to assess and interview patient.  2203: Dr. Foye Clock informed BG greater than 140 per orders. Orders modified for glucose control tonight.

## 2022-06-15 NOTE — Consult Note (Signed)
Ian Smiths for Hepairn Indication: chest pain/ACS  No Known Allergies  Patient Measurements: Height: 6' (182.9 cm) Weight: 61.2 kg (135 lb) IBW/kg (Calculated) : 77.6 Heparin Dosing Weight: 61.2 kg  Vital Signs: Temp: 98 F (36.7 C) (03/19 1555) BP: 125/74 (03/19 1555) Pulse Rate: 66 (03/19 1555)  Labs: Recent Labs    06/15/22 1553  HGB 14.7  HCT 44.0  PLT 223  APTT 27  LABPROT 14.4  INR 1.1  CREATININE 1.13  TROPONINIHS 21*    Estimated Creatinine Clearance: 68.5 mL/min (by C-G formula based on SCr of 1.13 mg/dL).   Medical History: Past Medical History:  Diagnosis Date   Diabetes mellitus with complication ([place])     Medications:  Medications Prior to Admission  Medication Sig Dispense Refill Last Dose   Buprenorphine HCl-Naloxone HCl 8-2 MG FILM Place 1 strip under the tongue 2 (two) times daily.      HUMALOG KWIKPEN 100 UNIT/ML KwikPen Inject 4-9 Units into the skin 3 (three) times daily. Sliding scale (go up by 1 unit)      insulin glargine (LANTUS) 100 UNIT/ML injection Inject 12 Units into the skin at bedtime.      Pancrelipase, Lip-Prot-Amyl, (CREON) 24000-76000 units CPEP Take 3 capsules (72,000 Units total) by mouth 3 (three) times daily. 200 capsule 1    Scheduled:  Infusions:  PRN: [id number], Heparin (Porcine) in NaCl, heparin sodium (porcine), iohexol, midazolam, verapamil Anti-infectives (From admission, onward)    None       Assessment: Pharmacy consulted to start heparin infusion 2 hours post radial band removed. 47 y.o. 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   Goal of Therapy:  Heparin level 0.3-0.7 units/ml Monitor platelets by  anticoagulation protocol: Yes   Plan:  Start heparin infusion at 700 units/hr Check anti-Xa level in 6 hours and daily while on heparin Continue to monitor H&H and platelets  Oswald Hillock, PharmD, BCPS 06/15/2022,5:20 PM

## 2022-06-15 NOTE — ED Provider Notes (Signed)
[facility] Provider Note    Event Date/Time   First MD Initiated Contact with Patient 06/15/22 1559     (approximate)   History   Chest Pain   HPI  Ian Gross is a 47 y.o. 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  The pain is central in location and described as pressure or squeezing.  He states that it initially was not too severe and he went to lie down in the bath.  The pain improved but then came back this afternoon and was more severe.  It is now resolved after he got aspirin and nitroglycerin from EMS.  The patient denies any tearing or ripping sensation and denies any pain radiating to the back.  He did feel associated shortness of breath but denied any nausea or vomiting.  He felt somewhat weak and unwell with the pain but not lightheaded.  He denies any prior history of similar pain.  He has no leg swelling.  He has no cough, fever, or GI symptoms.  I reviewed the past medical records.  The patient was most recent admitted in December 2022.  At that time he presented with nausea and vomiting and was found to be in DKA.  He has no prior cardiac history.   Physical Exam   Triage Vital Signs: ED Triage Vitals [06/15/22 1555]  Enc Vitals Group     BP 125/74     Pulse Rate 66     Resp 17     Temp 98 F (36.7 C)     Temp src      SpO2 100 %     Weight 135 lb (61.2 kg)     Height 6' (1.829 m)     Head Circumference      Peak Flow      Pain Score 5     Pain Loc      Pain Edu?      Excl. in [place]?     Most recent vital signs: Vitals:   06/15/22 1555 06/15/22 1609  BP: 125/74   Pulse: 66   Resp: 17   Temp: 98 F (36.7 C)   SpO2: 100% 100%     General: Awake, no distress.  CV:  Good peripheral perfusion.  Normal heart  sounds. Resp:  Normal effort.  Lungs CTAB. Abd:  No distention.  Other:  No peripheral edema.   ED Results / Procedures / Treatments   Labs (all labs ordered are listed, but only abnormal results are displayed) Labs Reviewed  CBC WITH DIFFERENTIAL/PLATELET  PROTIME-INR  APTT  COMPREHENSIVE METABOLIC PANEL  LIPID PANEL  TROPONIN I (HIGH SENSITIVITY)     EKG  ED ECG REPORT I, Arta Silence, the attending physician, personally viewed and interpreted this ECG.  Date: 06/15/2022 EKG Time: 1551 Rate: 74 Rhythm: normal sinus rhythm QRS Axis: normal Intervals: normal ST/T Wave abnormalities: Nonspecific anterior ST abnormalities Narrative Interpretation: Nonspecific abnormalities with no evidence of acute ischemia    RADIOLOGY    PROCEDURES:  Critical Care performed: Yes, see critical care procedure note(s)  .Critical Care  Performed by: Arta Silence, MD Authorized by: Arta Silence, MD   Critical care provider statement:    Critical care time (minutes):  30   Critical care time was exclusive of:  Separately billable procedures and treating other patients   Critical care  was necessary to treat or prevent imminent or life-threatening deterioration of the following conditions:  Cardiac failure   Critical care was time spent personally by me on the following activities:  Development of treatment plan with patient or surrogate, discussions with consultants, evaluation of patient's response to treatment, examination of patient, ordering and review of laboratory studies, ordering and review of radiographic studies, ordering and performing treatments and interventions, pulse oximetry, re-evaluation of patient's condition, review of old charts and obtaining history from patient or surrogate   Care discussed with: admitting provider      MEDICATIONS ORDERED IN ED: Medications  Heparin (Porcine) in NaCl 1000-0.9 UT/500ML-% SOLN (500 mLs  Given 06/15/22 1610)      IMPRESSION / MDM / ASSESSMENT AND PLAN / ED COURSE  I reviewed the triage vital signs and the nursing notes.  47 year old male with PMH as noted above presents with chest pain waxing and waning today, now relieved after aspirin and nitroglycerin given by EMS.  EMS EKG showed elevations in V3 and V4 and code STEMI was activated.  The patient is currently asymptomatic.  Physical exam is unremarkable.  EKG here shows nonspecific abnormalities but there are no longer ST elevations.  Differential diagnosis includes, but is not limited to, ACS, possibly unstable angina, musculoskeletal chest pain, GERD, other benign etiology.  There are no clinical findings to suggest aortic dissection or other vascular cause.  The patient has no tachycardia or hypoxia to suggest PE and he is PERC negative.  He denies any active drug use.  Dr. Saunders Revel from cardiology responded to the code STEMI and evaluated the patient in the ED; I consulted and discussed the case with him.  He recommends taking the patient to the Cath Lab for concern of ACS given the patient's unstable symptoms.  The patient agrees.  Patient's presentation is most consistent with acute presentation with potential threat to life or bodily function.  The patient is on the cardiac monitor to evaluate for evidence of arrhythmia and/or significant heart rate changes.     FINAL CLINICAL IMPRESSION(S) / ED DIAGNOSES   Final diagnoses:  Chest pain, unspecified type     Rx / DC Orders   ED Discharge Orders     None        Note:  This document was prepared using Dragon voice recognition software and may include unintentional dictation errors.    Arta Silence, MD 06/15/22 [phone]

## 2022-06-15 NOTE — H&P (Signed)
Cardiology Admission History and Physical   Patient ID: Ian Gross MRN: [id number]; DOB: [birth year]   Admission date: 06/15/2022  PCP:  Ian Fenton, NP   [place] Providers Cardiologist:  New to Dr End    Chief Complaint:  chest pain   Patient Profile:   Ian Gross is a 47 y.o. 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  History of Present Illness:   Ian Gross with above past medical history presented to [facility] complaining chest discomfort radiating to both arms.  Pain started around 10 AM today.  He reported associated nausea.  Symptoms lasted for few hours.  He ultimately called 911.  EMS found him to have anterior ST elevation on EKG. he was given aspirin and nitroglycerin with subsequent resolution of chest pain.  He was chest pain-free upon arrival to [facility].  Repeat EKG revealed nonspecific ST changes.    Urgent cardiac catheterization was performed by Dr. Saunders Revel which revealed severe proximal LAD disease involving a moderate-caliber first diagonal branch and extending back to the ostium. Due to close proximity to ramus intermedius and left circumflex, it was felt that PCI was high risk and that the patient may be better served by CABG. Therefore, intervention was not performed.  LV gram showed mildly reduced left ventricular systolic function with mild mid and apical anterior and apical inferior hypokinesis (LVEF 45-50%). LVEDP 6 mmHg.  He was transferred to [facility] for CT surgery evaluation for urgent CABG.   Additional diagnostic workup today at [facility] revealed creatinine 1.13, alk phos 147, albumin 3.4, AST 48.  High sensitive troponin 21. WBC 12100. INR 1.1.  HDL 48, LDL 38, triglyceride  58.  Upon arrival, was hemodynamically stable without chest pain.    Past Medical History:  Diagnosis Date   Diabetes mellitus with complication [facility])     Past Surgical History:  Procedure Laterality Date   PANCREATECTOMY N/A    SPLENECTOMY       Medications Prior to Admission: Prior to Admission medications   Medication Sig Start Date End Date Taking? Authorizing Provider  Buprenorphine HCl-Naloxone HCl 8-2 MG FILM Place 1 strip under the tongue 2 (two) times daily. 11/15/20   [provider]  HUMALOG KWIKPEN 100 UNIT/ML KwikPen Inject 4-9 Units into the skin 3 (three) times daily. Sliding scale (go up by 1 unit) 12/28/20   [provider]  insulin glargine (LANTUS) 100 UNIT/ML injection Inject 12 Units into the skin at bedtime.    [provider]  Pancrelipase, Lip-Prot-Amyl, (CREON) 24000-76000 units CPEP Take 3 capsules (72,000 Units total) by mouth 3 (three) times daily. 03/11/22   Ian Fenton, NP     Allergies:   No Known Allergies  Social History:   Social History   Socioeconomic History   Marital status: Significant Other    Spouse name: Not on file   Number of children: 2   Years of education: Not on file   Highest education level: Not on file  Occupational History   Occupation: Disable  Tobacco Use   Smoking status: Every Day    Packs/day: .75    Types: Cigarettes   Smokeless tobacco: Never  Vaping Use   Vaping Use: Never used  Substance and Sexual Activity   Alcohol use: Not Currently   Drug use: Not  Currently   Sexual activity: Not on file  Other Topics Concern   Not on file  Social History Narrative   Not on file   Social Determinants of Health   Financial Resource Strain: Low Risk  (09/03/2021)   Overall Financial Resource Strain (CARDIA)    Difficulty of Paying Living Expenses: Not hard at all  Food Insecurity: No Food Insecurity (09/03/2021)   Hunger Vital Sign    Worried About Running Out of Food in the Last Year:  Never true    Ran Out of Food in the Last Year: Never true  Transportation Needs: No Transportation Needs (09/03/2021)   PRAPARE - Hydrologist (Medical): No    Lack of Transportation (Non-Medical): No  Physical Activity: Not on file  Stress: No Stress Concern Present (09/03/2021)   [place]    Feeling of Stress : Only a little  Social Connections: Moderately Isolated (09/03/2021)   Social Connection and Isolation Panel [NHANES]    Frequency of Communication with Friends and Family: Twice a week    Frequency of Social Gatherings with Friends and Family: More than three times a week    Attends Religious Services: Never    Marine scientist or Organizations: No    Attends Music therapist: Not on file    Marital Status: Living with partner  Intimate Partner Violence: Not At Risk (09/03/2021)   Humiliation, Afraid, Rape, and Kick questionnaire    Fear of Current or Ex-Partner: No    Emotionally Abused: No    Physically Abused: No    Sexually Abused: No    Family History:   The patient's family history includes Alcohol abuse in his maternal grandfather; CVA in his maternal grandmother; Healthy in his brother and sister; Liver cancer in his maternal grandfather; Lung cancer in his father.    ROS:  Please see the history of present illness.  All other ROS reviewed and negative.     Physical Exam/Data:   Vitals:   06/15/22 2052  Temp: 98.5 F (36.9 C)  TempSrc: Oral   No intake or output data in the 24 hours ending 06/15/22 2137    06/15/2022    5:45 PM 06/15/2022    3:55 PM 12/09/2021   10:38 AM  Last 3 Weights  Weight (lbs) 135 lb 2.3 oz 135 lb 129 lb  Weight (kg) 61.3 kg 61.236 kg 58.514 kg     There is no height or weight on file to calculate BMI.   General:  Well nourished, well developed, in no acute distress HEENT: normal Neck: no JVD Vascular: No carotid  bruits; Distal pulses 2+ bilaterally   Cardiac:  normal S1, S2; RRR; no murmur  Lungs:  clear to auscultation bilaterally, no wheezing, rhonchi or rales  Abd: soft, nontender, no hepatomegaly  Ext: no edema, L radial site clean dry and intact Musculoskeletal:  No deformities, BUE and BLE strength normal and equal Skin: warm and dry  Neuro:  CNs 2-12 intact, no focal abnormalities noted Psych:  Normal affect   EKG:  The ECG  was personally reviewed and demonstrates sinus arrythmia with mild STE in AVR, V1-V2 with associated TWI, no other ischemic changes   Relevant CV Studies:   LHC 06/15/22:  Conclusions: Severe complex ostial/proximal LAD involving moderate-caliber D1 that is difficult to visualize due to vessel overlap.  Lesion appears to be an acute plaque rupture with up to  90% stenosis involving the LAD and 75% stenosis involving D1 leading to the patient's NSTEMI.  LMCA, ramus intermedius, LCx, and RCA are without significant disease. Mildly reduced left ventricular systolic function with mild mid and apical anterior and apical inferior hypokinesis (LVEF 45-50%). Normal left ventricular filling pressure (LVEDP 6 mmHg).   Recommendations: Case reviewed with Dr. Ellyn Hack (interventional cardiology) and Dr. Kipp Brood (cardiothoracic surgery).  Given proximal LAD disease involving sizeable D1 and adjacent ramus intermedius branch, PCI would be high risk.  In the setting of his DM and LAD/D1 disease, we have agreed that CABG may be the best revascularization option.  Given that Mr. Dagan is currently chest pain free and hemodynamically stable, we will transfer him to [facility] for urgent evaluation and possible CABG as soon as tomorrow. Start IV heparin 2 hours after TR band deflation. High-intensity statin therapy. Aspirin 81 mg daily; defer P2Y12 inhibitor pending cardiac surgery evaluation. Start metoprolol tartrate 12.5 mg twice daily.   Laboratory Data:  High Sensitivity  Troponin:   Recent Labs  Lab 06/15/22 1553 06/15/22 1803  TROPONINIHS 21* 101*      Chemistry Recent Labs  Lab 06/15/22 1553  NA 140  K 4.0  CL 102  CO2 28  GLUCOSE 147*  BUN 16  CREATININE 1.13  CALCIUM 8.8*  GFRNONAA >60  ANIONGAP 10    Recent Labs  Lab 06/15/22 1553  PROT 6.0*  ALBUMIN 3.4*  AST 48*  ALT 32  ALKPHOS 147*  BILITOT 0.6   Lipids  Recent Labs  Lab 06/15/22 1553  CHOL 98  TRIG 58  HDL 48  LDLCALC 38  CHOLHDL 2.0   Hematology Recent Labs  Lab 06/15/22 1553  WBC 12.1*  RBC 4.47  HGB 14.7  HCT 44.0  MCV 98.4  MCH 32.9  MCHC 33.4  RDW 13.7  PLT 223   Thyroid No results for input(s): "TSH", "FREET4" in the last 168 hours. BNPNo results for input(s): "BNP", "PROBNP" in the last 168 hours.  DDimer No results for input(s): "DDIMER" in the last 168 hours.   Radiology/Studies:  CARDIAC CATHETERIZATION  Addendum Date: 06/15/2022   Conclusions: Severe complex ostial/proximal LAD involving moderate-caliber D1 that is difficult to visualize due to vessel overlap.  Lesion appears to be an acute plaque rupture with up to 90% stenosis involving the LAD and 75% stenosis involving D1 leading to the patient's NSTEMI.  LMCA, ramus intermedius, LCx, and RCA are without significant disease. Mildly reduced left ventricular systolic function with mild mid and apical anterior and apical inferior hypokinesis (LVEF 45-50%). Normal left ventricular filling pressure (LVEDP 6 mmHg). Recommendations: Case reviewed with Dr. Ellyn Hack (interventional cardiology) and Dr. Kipp Brood (cardiothoracic surgery).  Given proximal LAD disease involving sizeable D1 and adjacent ramus intermedius branch, PCI would be high risk.  In the setting of his DM and LAD/D1 disease, we have agreed that CABG may be the best revascularization option.  Given that Mr. Wines is currently chest pain free and hemodynamically stable, we will transfer him to [facility] for urgent evaluation and possible  CABG as soon as tomorrow. Start IV heparin 2 hours after TR band deflation. High-intensity statin therapy. Aspirin 81 mg daily; defer P2Y12 inhibitor pending cardiac surgery evaluation. Start metoprolol tartrate 12.5 mg twice daily. Nelva Bush, MD [facility]  Result Date: 06/15/2022 Conclusions: Severe complex ostial/proximal LAD involving moderate-caliber D1 that is difficult to visualize due to vessel overlap.  Lesion appears to be an acute plaque rupture with up to  90% stenosis involving the LAD and 75% stenosis involving D1 leading to the patient's NSTEMI.  LMCA, ramus intermedius, LCx, and RCA are without significant disease. Mildly reduced left ventricular systolic function with mild mid and apical anterior and apical inferior hypokinesis (LVEF 45-50%). Normal left ventricular filling pressure (LVEDP 6 mmHg). Recommendations: Case reviewed with Dr. Ellyn Hack (interventional cardiology) and Dr. Kipp Brood (cardiothoracic surgery).  Given proximal LAD disease involving sizeable D1 and adjacent ramus intermedius branch, PCI would be high risk.  In the setting of his DM and LAD/D1 disease, we have agreed that CABG may be the best revascularization option.  Given that Mr. Perelman is currently chest pain free and hemodynamically stable, we will transfer him to [facility] for urgent evaluation and possible CABG as soon as tomorrow. Start IV heparin 2 hours after TR band deflation. High-intensity statin therapy. Aspirin 81 mg daily; defer P2Y12 inhibitor pending cardiac surgery evaluation. Start metoprolol tartrate 12.5 mg twice daily. Nelva Bush, MD [facility]    Assessment and Plan:   Anterior STEMI  Severe proximal LAD disease - presented with chest pain, sent to [facility] for STEMI code, emergent LHC today showed severe proximal LAD disease involving a moderate-caliber first diagonal branch and extending back to the ostium. Due to close proximity to ramus intermedius and left circumflex, it was  felt that PCI was high risk and that the patient may be better served by CABG. Transferred here for CTS consult. - LDL 38, will check LPA - check A1C and TSH and Echo  - admit to ICU , continuous telemetry monitor  - NPO after midnight - continue ASA 81mg  daily, resume heparin gtt 2 hours after TR band deflation , start lipitor 80mg  daily and  metoprolol XL 12.5mg  daily  - check Echo  - CTS consult has been requested   Hypertension -Blood pressure overall stable  Secondary insulin-dependent diabetes after pancreatectomy  Hx of alcoholic pancreatitis Chronic pain syndrome History of splenectomy -Consult hospital medicine regarding management; currently half dose glargine 6 U while NPO with very low SSI - home creon and suboxone ordered  Risk Assessment/Risk Scores:     Severity of Illness: The appropriate patient status for this patient is INPATIENT. Inpatient status is judged to be reasonable and necessary in order to provide the required intensity of service to ensure the patient's safety. The patient's presenting symptoms, physical exam findings, and initial radiographic and laboratory data in the context of their chronic comorbidities is felt to place them at high risk for further clinical deterioration. Furthermore, it is not anticipated that the patient will be medically stable for discharge from the hospital within 2 midnights of admission.   * I certify that at the point of admission it is my clinical judgment that the patient will require inpatient hospital care spanning beyond 2 midnights from the point of admission due to high intensity of service, high risk for further deterioration and high frequency of surveillance required.*   For questions or updates, please contact [place] Please consult [email or web address] for contact info under     Signed, Silas Flood, MD  06/15/2022 9:37 PM

## 2022-06-15 NOTE — Consult Note (Signed)
[place] for Heparin Indication: CAD - awaiting CABG  No Known Allergies  Patient Measurements:   Heparin Dosing Weight: 61.2 kg  Vital Signs: Temp: 98.5 F (36.9 C) (03/19 2052) Temp Source: Oral (03/19 2052) BP: 110/65 (03/19 2000) Pulse Rate: 76 (03/19 2000)  Labs: Recent Labs    06/15/22 1553 06/15/22 1803  HGB 14.7  --   HCT 44.0  --   PLT 223  --   APTT 27  --   LABPROT 14.4  --   INR 1.1  --   CREATININE 1.13  --   TROPONINIHS 21* 101*     Estimated Creatinine Clearance: 68.6 mL/min (by C-G formula based on SCr of 1.13 mg/dL).   Assessment: Pt s/p cath which showed complete ostial/proximal LAD disease - PCI would be high risk. CVTS consult for CABG. To restart heparin 2 hours post TR band removal. TR band was removed ~1930.  Goal of Therapy:  Heparin level 0.3-0.7 units/ml Monitor platelets by anticoagulation protocol: Yes   Plan:  Start heparin infusion at 700 units/hr Check anti-Xa level in 6 hours and daily while on heparin Continue to monitor H&H and platelets  Sherlon Handing, PharmD, BCPS Please see amion for complete clinical pharmacist phone list 06/15/2022,9:45 PM

## 2022-06-15 NOTE — Progress Notes (Signed)
Tr band off at 1925. Pt is resting in bed. Pts v/s are stable and pt has no c/o pain or discomfort noted or stated.

## 2022-06-15 NOTE — ED Triage Notes (Signed)
Pt arrived via EMS with complaint of chest pain. Pt states he began having chest pain around 1000 with nausea and decided to take a bath. Pt states the pain went away shortly after but then around 1500 the pain returned and was worse. Per EMS the pt was pale and diaphoretic upon arrival. Pt endorses arm numbness bilaterally. Pt denies SOB. Pt received 324mg  of ASA and 2 sprays of nitroglycerin in route. Pt A&Ox4.

## 2022-06-15 NOTE — Progress Notes (Signed)
4 (four) Buprenorphine and Naloxone sublingual films removed from patients possession and given to pharmacist Sherlon Handing. Patient informed that medications will be secure with main pharmacy and returned to him at discharge.

## 2022-06-16 ENCOUNTER — Inpatient Hospital Stay (HOSPITAL_COMMUNITY): Payer: Medicare Other | Admitting: Anesthesiology

## 2022-06-16 ENCOUNTER — Inpatient Hospital Stay (HOSPITAL_COMMUNITY): Payer: Medicare Other

## 2022-06-16 ENCOUNTER — Encounter: Payer: Self-pay | Admitting: Internal Medicine

## 2022-06-16 ENCOUNTER — Ambulatory Visit: Payer: Medicare Other | Admitting: Internal Medicine

## 2022-06-16 ENCOUNTER — Other Ambulatory Visit (HOSPITAL_COMMUNITY): Payer: Medicare Other

## 2022-06-16 ENCOUNTER — Encounter (HOSPITAL_COMMUNITY)
Admission: EM | Disposition: A | Discharge status: Home Health | Payer: Self-pay | Source: Other Acute Inpatient Hospital | Attending: Thoracic Surgery (Cardiothoracic Vascular Surgery)

## 2022-06-16 DIAGNOSIS — I2510 Atherosclerotic heart disease of native coronary artery without angina pectoris: Secondary | ICD-10-CM

## 2022-06-16 DIAGNOSIS — I214 Non-ST elevation (NSTEMI) myocardial infarction: Secondary | ICD-10-CM

## 2022-06-16 DIAGNOSIS — I351 Nonrheumatic aortic (valve) insufficiency: Secondary | ICD-10-CM

## 2022-06-16 DIAGNOSIS — I25110 Atherosclerotic heart disease of native coronary artery with unstable angina pectoris: Secondary | ICD-10-CM

## 2022-06-16 DIAGNOSIS — E109 Type 1 diabetes mellitus without complications: Secondary | ICD-10-CM

## 2022-06-16 LAB — GLUCOSE, CAPILLARY
Glucose-Capillary: 94 mg/dL (ref 70–99)
Glucose-Capillary: 228 mg/dL — ABNORMAL HIGH (ref 70–99)
Glucose-Capillary: 242 mg/dL — ABNORMAL HIGH (ref 70–99)

## 2022-06-16 LAB — HEPARIN LEVEL (UNFRACTIONATED)
Heparin Unfractionated: <0.1 IU/mL — ABNORMAL LOW (ref 0.30–0.70)
Heparin Unfractionated: 0.19 IU/mL — ABNORMAL LOW (ref 0.30–0.70)
Heparin Unfractionated: 0.55 IU/mL (ref 0.30–0.70)

## 2022-06-16 LAB — CBC
HCT: 41.2 % (ref 39.0–52.0)
Hemoglobin: 14.3 g/dL (ref 13.0–17.0)
MCH: 33.6 pg (ref 26.0–34.0)
MCHC: 34.7 g/dL (ref 30.0–36.0)
MCV: 96.7 fL (ref 80.0–100.0)
Platelets: 205 10*3/uL (ref 150–400)
RBC: 4.26 MIL/uL (ref 4.22–5.81)
RDW: 13.9 % (ref 11.5–15.5)
WBC: 11.7 10*3/uL — ABNORMAL HIGH (ref 4.0–10.5)
nRBC: 0 % (ref 0.0–0.2)

## 2022-06-16 LAB — HEMOGLOBIN A1C
Hgb A1c MFr Bld: 7.3 % — ABNORMAL HIGH (ref 4.8–5.6)
Hgb A1c MFr Bld: 7.6 % — ABNORMAL HIGH (ref 4.8–5.6)
Mean Plasma Glucose: 163 mg/dL
Mean Plasma Glucose: 171 mg/dL

## 2022-06-16 LAB — ECHOCARDIOGRAM COMPLETE
AR max vel: 2.39 cm2
AV Area VTI: 256.63 cm2
AV Area mean vel: 2.2 cm2
AV Mean grad: 8 mmHg
AV Peak grad: 14 mmHg
Ao pk vel: 1.87 m/s
Area-P 1/2: 3.93 cm2
Est EF: 40
Height: 72 in
S' Lateral: 3.9 cm
Weight: 2162.27 oz

## 2022-06-16 LAB — BASIC METABOLIC PANEL
Anion gap: 11 (ref 5–15)
BUN: 14 mg/dL (ref 6–20)
CO2: 24 mmol/L (ref 22–32)
Calcium: 8.5 mg/dL — ABNORMAL LOW (ref 8.9–10.3)
Chloride: 105 mmol/L (ref 98–111)
Creatinine, Ser: 1.03 mg/dL (ref 0.61–1.24)
GFR, Estimated: >60 mL/min (ref >60)
Glucose, Bld: 87 mg/dL (ref 70–99)
Potassium: 4.2 mmol/L (ref 3.5–5.1)
Sodium: 140 mmol/L (ref 135–145)

## 2022-06-16 LAB — PREPARE RBC (CROSSMATCH)

## 2022-06-16 LAB — TSH: TSH: 3.113 u[IU]/mL (ref 0.350–4.500)

## 2022-06-16 LAB — SARS CORONAVIRUS 2 (TAT 6-24 HRS): SARS Coronavirus 2: NEGATIVE

## 2022-06-16 LAB — HIV ANTIBODY (ROUTINE TESTING W REFLEX): HIV Screen 4th Generation wRfx: NONREACTIVE

## 2022-06-16 LAB — ECHO INTRAOPERATIVE TEE: Weight: 2162.27 oz

## 2022-06-16 LAB — ABO/RH: ABO/RH(D): A POS

## 2022-06-16 SURGERY — CANCELLED PROCEDURE
Site: Chest

## 2022-06-16 MED ORDER — TRANEXAMIC ACID (OHS) BOLUS VIA INFUSION
15.0000 mg/kg | INTRAVENOUS | Status: DC
  Filled 2022-06-16: qty 920

## 2022-06-16 MED ORDER — CHLORHEXIDINE GLUCONATE CLOTH 2 % EX PADS
6.0000 | MEDICATED_PAD | Freq: Once | CUTANEOUS | Status: AC
  Administered 2022-06-16: 6 via TOPICAL

## 2022-06-16 MED ORDER — DEXMEDETOMIDINE HCL IN NACL 400 MCG/100ML IV SOLN
0.1000 ug/kg/h | INTRAVENOUS | Status: DC
  Filled 2022-06-16: qty 100

## 2022-06-16 MED ORDER — FENTANYL CITRATE (PF) 250 MCG/5ML IJ SOLN
INTRAMUSCULAR | Status: AC
  Filled 2022-06-16: qty 5

## 2022-06-16 MED ORDER — PHENYLEPHRINE 80 MCG/ML (10ML) SYRINGE FOR IV PUSH (FOR BLOOD PRESSURE SUPPORT)
PREFILLED_SYRINGE | INTRAVENOUS | Status: AC
  Filled 2022-06-16: qty 10

## 2022-06-16 MED ORDER — MILRINONE LACTATE IN DEXTROSE 20-5 MG/100ML-% IV SOLN
0.3000 ug/kg/min | INTRAVENOUS | Status: DC
  Filled 2022-06-16: qty 100

## 2022-06-16 MED ORDER — NOREPINEPHRINE 4 MG/250ML-% IV SOLN
0.0000 ug/min | INTRAVENOUS | Status: DC
  Filled 2022-06-16: qty 250

## 2022-06-16 MED ORDER — PROPOFOL 10 MG/ML IV BOLUS
INTRAVENOUS | Status: AC
  Filled 2022-06-16: qty 20

## 2022-06-16 MED ORDER — INSULIN REGULAR(HUMAN) IN NACL 100-0.9 UT/100ML-% IV SOLN
INTRAVENOUS | Status: DC
  Filled 2022-06-16: qty 100

## 2022-06-16 MED ORDER — HEPARIN 30,000 UNITS/1000 ML (OHS) CELLSAVER SOLUTION
Status: DC
  Filled 2022-06-16: qty 1000

## 2022-06-16 MED ORDER — TRANEXAMIC ACID (OHS) PUMP PRIME SOLUTION
2.0000 mg/kg | INTRAVENOUS | Status: DC
  Filled 2022-06-16: qty 1.23

## 2022-06-16 MED ORDER — CEFAZOLIN SODIUM-DEXTROSE 2-4 GM/100ML-% IV SOLN
2.0000 g | INTRAVENOUS | Status: DC
  Filled 2022-06-16: qty 100

## 2022-06-16 MED ORDER — PHENYLEPHRINE HCL-NACL 20-0.9 MG/250ML-% IV SOLN
30.0000 ug/min | INTRAVENOUS | Status: DC
  Filled 2022-06-16: qty 250

## 2022-06-16 MED ORDER — BISACODYL 5 MG PO TBEC
5.0000 mg | DELAYED_RELEASE_TABLET | Freq: Once | ORAL | Status: AC
  Administered 2022-06-16: 5 mg via ORAL
  Filled 2022-06-16: qty 1

## 2022-06-16 MED ORDER — TEMAZEPAM 15 MG PO CAPS: 15.0000 mg | ORAL_CAPSULE | Freq: Once | ORAL | Status: DC | PRN

## 2022-06-16 MED ORDER — METOPROLOL TARTRATE 12.5 MG HALF TABLET
12.5000 mg | ORAL_TABLET | Freq: Once | ORAL | Status: AC
  Administered 2022-06-16: 12.5 mg via ORAL
  Filled 2022-06-16: qty 1

## 2022-06-16 MED ORDER — SODIUM CHLORIDE (PF) 0.9 % IJ SOLN
INTRAMUSCULAR | Status: AC
  Filled 2022-06-16: qty 10

## 2022-06-16 MED ORDER — LIDOCAINE 2% (20 MG/ML) 5 ML SYRINGE
INTRAMUSCULAR | Status: AC
  Filled 2022-06-16: qty 5

## 2022-06-16 MED ORDER — HEPARIN SODIUM (PORCINE) 1000 UNIT/ML IJ SOLN
INTRAMUSCULAR | Status: AC
  Filled 2022-06-16: qty 1

## 2022-06-16 MED ORDER — POTASSIUM CHLORIDE 2 MEQ/ML IV SOLN
80.0000 meq | INTRAVENOUS | Status: DC
  Filled 2022-06-16: qty 40

## 2022-06-16 MED ORDER — MIDAZOLAM HCL (PF) 10 MG/2ML IJ SOLN
INTRAMUSCULAR | Status: AC
  Filled 2022-06-16: qty 2

## 2022-06-16 MED ORDER — PLASMA-LYTE A IV SOLN
INTRAVENOUS | Status: DC
  Filled 2022-06-16: qty 2.5

## 2022-06-16 MED ORDER — ROCURONIUM BROMIDE 10 MG/ML (PF) SYRINGE
PREFILLED_SYRINGE | INTRAVENOUS | Status: AC
  Filled 2022-06-16: qty 10

## 2022-06-16 MED ORDER — MANNITOL 20 % IV SOLN
INTRAVENOUS | Status: DC
  Filled 2022-06-16: qty 13

## 2022-06-16 MED ORDER — EPINEPHRINE HCL 5 MG/250ML IV SOLN IN NS
0.0000 ug/min | INTRAVENOUS | Status: DC
  Filled 2022-06-16: qty 250

## 2022-06-16 MED ORDER — TRANEXAMIC ACID 1000 MG/10ML IV SOLN
1.5000 mg/kg/h | INTRAVENOUS | Status: DC
  Filled 2022-06-16: qty 25

## 2022-06-16 MED ORDER — NITROGLYCERIN IN D5W 200-5 MCG/ML-% IV SOLN
2.0000 ug/min | INTRAVENOUS | Status: DC
  Filled 2022-06-16: qty 250

## 2022-06-16 MED ORDER — VANCOMYCIN HCL 1250 MG/250ML IV SOLN
1250.0000 mg | INTRAVENOUS | Status: DC
  Filled 2022-06-16: qty 250

## 2022-06-16 MED ORDER — CHLORHEXIDINE GLUCONATE 0.12 % MT SOLN
15.0000 mL | Freq: Once | OROMUCOSAL | Status: AC
  Administered 2022-06-16: 15 mL via OROMUCOSAL
  Filled 2022-06-16: qty 15

## 2022-06-16 NOTE — Progress Notes (Signed)
Dr. Harrell Gave signed out the pending question of whether TEE would be needed for this patient. Reached out to [employer] who confirmed with Dr. Kipp Brood TEE not needed.

## 2022-06-16 NOTE — Discharge Summary (Signed)
[address]       [place] [id number]             [phone]    Physician Discharge Summary  Patient ID: Ian Gross MRN: [id number] DOB/AGE: 50/03/1975 47 y.o.  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  He was [facility] into [facility] ED via EMS with complaints of chest pain.  This began yesterday morning and was associated with nausea.  The pain initially resolved, but returned and got worse.  He developed shortness of breath, diaphoresis, and numbness bilaterally.  He received ASA and NTG enroute to the hospital.  EKG obtained by EMS showed anterior ST elevation and code STEMI was activated.  Repeat EKG in the ED showed non-specific changes.  There was concern the patient had unstable plaque in his LAD which is felt to be source of chest pain and EKG changes.  He was evaluated by Dr. Okey DupreEnd in the ED who recommended catheterization.  The risks and benefits of the procedure were  explained to the patient and he was agreeable to proceed.  This was performed and revealed LM involvement with acute plaque rupture with 90% stenosis involving LAD and 75% involving D1.  It was felt coronary bypass grafting would the best treatment option.  The [facility].  Hospital Course:  The patient was admitted to the ICU.  The patient was chest pain free on admission.  He was evaluated by Dr. Cliffton AstersLightfoot who recommended coronary bypass grafting procedure;however echocardiogram obtained revealed severe AI.  The patient had poor dentition and required dental consultation. Multiple teeth were extracted on 06/23/2022. The patient underwent CABG x 2 and mechanical AVR. He was extubated early evening of surgery. He was weaned off Neo Synephrine drip. He had post op blood loss anemia and was transfused. He had thrombocytopenia as well. Platelets went as low as 82,000. He was started on Coumadin on 04/02 and PT/INR were monitored daily. He was transitioned off the Insulin drip. His pre op HGA1C is 7.9. He was volume overloaded and diuresed accordingly. He was felt surgically stable for transfer from the ICU to the floor on 04/03.  He developed a drop in his hemoglobin level to 6.2.   Repeat H/H confirmed Hgb level of 7.1.  He did not require transfusion.  He was resumed on coumadin on 07/03/2022.  His INR is 2.1 and he will require 2.5 mg of Coumadin at discharge. He has an appointment for the PT/INR  to be drawn on Wednesday 04/10 and he was instructed on the importance of taking Coumadin as directed. He has been tolerating a diet. He was ambulating on room air with good oxygenation.  He was tolerating a diet and had a bowel movment. All wounds are clean, dry, healing without signs of infection.  Consults: None  Significant Diagnostic Studies:    Treatments: surgery:  CABG X 2.  LIMA LAD, RSVG Diagonal   Aortic valve replacement with a 23mm On-X valve   Endoscopic greater saphenous vein harvest on the right by Dr. Cliffton Asters on 06/28/2022.  Narrative & Impression  CLINICAL DATA:  Follow-up trace bilateral apical pneumothoraces.   EXAM: CHEST - 2 VIEW   COMPARISON:  06/30/2022   FINDINGS: No pneumothorax on either side. Mildly enlarged cardiac silhouette with a mild decrease in size. Stable post CABG changes. Increased prominence of the interstitial markings bilaterally with [employer]. Interval small to moderate-sized left pleural effusion and small right pleural effusion. Stable mild elevation of the left hemidiaphragm. The lungs are mildly hyperexpanded. A prosthetic heart valve is noted. Unremarkable bones.   IMPRESSION: 1. No pneumothorax. 2. Interval mild changes of congestive heart failure superimposed on COPD. 3. Interval small to moderate-sized left pleural effusion and small right pleural effusion.     Electronically Signed   By: Beckie Salts M.D.   On: 07/03/2022 08:21    Narrative & Impression  CLINICAL DATA:  Status post CABG.   EXAM: PORTABLE CHEST 1 VIEW   COMPARISON:  Earlier radiograph dated 06/28/2022.   FINDINGS: Interval removal of the endotracheal and enteric tubes. Right IJ catheter in similar position. Minimal left lung base atelectasis. No pleural effusion. No detectable pneumothorax in the right apex. Stable cardiac silhouette. Median sternotomy wires and CABG vascular clips. No acute osseous pathology. Right supraclavicular soft tissue air.   IMPRESSION: 1. Interval removal of the endotracheal and enteric tubes. 2. Minimal left lung base atelectasis.  No detectable pneumothorax.     Electronically Signed   By: Elgie Collard M.D.   On: 06/29/2022 00:57      Discharge Exam: Cardiovascular: RRR Pulmonary: Clear to auscultation bilaterally Abdomen: Soft, non tender, bowel sounds present. Extremities: No lower extremity edema. Wounds: Clean and dry.  No erythema or signs of  infection.  Discharge Medications:  The patient has been discharged on:   1.Beta Blocker:  Yes [ x  ]                              No   [   ]                              If No, reason:  2.Ace Inhibitor/ARB: Yes [   ]                                     No  [ x   ]                                     If No, reason:Labile BP  3.Statin:   Yes [  x ]                  No  [   ]  If No, reason:  4.Ecasa:  Yes  [ x  ]                  No   [   ]                  If No, reason:  Patient had ACS upon admission:Yes  Plavix/P2Y12 inhibitor: Yes [   ]                                      No  [  x ]. On Coumadin for mechanical AVR     Discharge Instructions     Amb Referral to Cardiac Rehabilitation   Complete by: As directed    Diagnosis: CABG   CABG X ___: 2   After initial evaluation and assessments completed: Virtual Based Care may be provided alone or in conjunction with Phase 2 Cardiac Rehab based on patient barriers.: Yes   Intensive Cardiac Rehabilitation (ICR) [facility] location only OR Traditional Cardiac Rehabilitation (TCR) *If criteria for ICR are not met will enroll in TCR [facility] only): Yes      Allergies as of 07/05/2022   No Known Allergies      Medication List     TAKE these medications    atorvastatin 80 MG tablet Commonly known as: LIPITOR Take 1 tablet (80 mg total) by mouth daily. Start taking on: July 06, 2022   Buprenorphine HCl-Naloxone HCl 8-2 MG Film Place 1 Film under the tongue 2 (two) times daily.   Creon 24000-76000 units Cpep Generic drug: Pancrelipase (Lip-Prot-Amyl) Take 3 capsules (72,000 Units total) by mouth 3 (three) times daily.   gabapentin 300 MG capsule Commonly known as: NEURONTIN Take 1 capsule (300 mg total) by mouth 3 (three) times daily.   HumaLOG KwikPen 100 UNIT/ML KwikPen Generic drug: insulin lispro Inject 4-9 Units into the skin 3 (three) times daily. Sliding scale (go up by 1 unit)   insulin glargine  100 UNIT/ML injection Commonly known as: LANTUS Inject 10 Units into the skin at bedtime.   methocarbamol 500 MG tablet Commonly known as: ROBAXIN Take 1 tablet (500 mg total) by mouth every 8 (eight) hours as needed for muscle spasms.   metoprolol tartrate 25 MG tablet Commonly known as: LOPRESSOR Take 1 tablet (25 mg total) by mouth 2 (two) times daily.   nicotine 14 mg/24hr patch Commonly known as: NICODERM CQ - dosed in mg/24 hours Place 1 patch (14 mg total) onto the skin daily. Start taking on: July 06, 2022   oxyCODONE 5 MG immediate release tablet Commonly known as: Oxy IR/ROXICODONE Take 1 tablet (5 mg total) by mouth every 6 (six) hours as needed for severe pain.   warfarin 2.5 MG tablet Commonly known as: COUMADIN Take 1 tablet (2.5 mg total) by mouth daily at 4 PM. Or as directed        Follow-up Information     Lightfoot, Eliezer LoftsHarrell O, MD Follow up.   Specialty: Cardiothoracic Surgery Why: Appointment is VIRTUAL (please do NOT come to the office).  Dr. Cliffton AstersLightfoot will call you on 04/12 at 2:20 pm Contact information: [address] [place] [address] [phone]         Charlsie QuestHammock, Sheri, NP. Go on 07/21/2022.   Specialty: Cardiology Why: Appointment time is at 10:05 am Contact information: [address], [address] [place]  [address] [phone]         [facility] at [facility]. Go on 08/09/2022.   Why: Appointment time is at 9:00 am        [facility] Follow up.   Why: Home Health agency will call to arrange appts Contact information: [address] [place] [phone]         [facility]. Go on 07/07/2022.   Specialty: Cardiology Why: Appointment is to have (PT/INR drawn as is on Coumadin for On X mechanical AVR). Appointment time is at 10:30 am Contact information: [address], [address] [place]  [address] [phone]                Signed:  Ardelle Balls, Cordelia Poche 07/05/2022, 9:21 AM

## 2022-06-16 NOTE — Inpatient Diabetes Management (Addendum)
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   Would consider also adding Novolog 2 units TID (assuming patient is consuming >50% of meals). Will follow.   Thanks, Bronson Curb, MSN, RNC-OB Diabetes Coordinator [phone] (8a-5p)

## 2022-06-16 NOTE — Anesthesia Preprocedure Evaluation (Addendum)
Anesthesia Evaluation    Reviewed: Allergy & Precautions, Patient's Chart, lab work & pertinent test results, Unable to perform ROS - Chart review onlyPreop documentation limited or incomplete due to emergent nature of procedure.  History of Anesthesia Complications Negative for: history of anesthetic complications  Airway        Dental   Pulmonary Current Smoker          Cardiovascular + CAD and + Past MI     '24 Cath - 1.Severe complex ostial/proximal LAD involving moderate-caliber D1 that is difficult to visualize due to vessel overlap.  Lesion appears to be an acute plaque rupture with up to 90% stenosis involving the LAD and 75% stenosis involving D1 leading to the patient's NSTEMI.  LMCA, ramus intermedius, LCx, and RCA are without significant disease. 2.Mildly reduced left ventricular systolic function with mild mid and apical anterior and apical inferior hypokinesis (LVEF 45-50%). 3.Normal left ventricular filling pressure (LVEDP 6 mmHg).    Neuro/Psych    GI/Hepatic   Endo/Other  diabetes, Insulin Dependent    Renal/GU      Musculoskeletal   Abdominal   Peds  Hematology  S/p splenectomy    Anesthesia Other Findings   Reproductive/Obstetrics                             Anesthesia Physical Anesthesia Plan  ASA: 4 and emergent  Anesthesia Plan: General   Post-op Pain Management:    Induction: Intravenous  PONV Risk Score and Plan: 2 and Treatment may vary due to age or medical condition  Airway Management Planned: Oral ETT  Additional Equipment: Arterial line, CVP, TEE, Ultrasound Guidance Line Placement and 3D TEE  Intra-op Plan:   Post-operative Plan: Post-operative intubation/ventilation  Informed Consent:   Plan Discussed with: CRNA and Anesthesiologist  Anesthesia Plan Comments:         Anesthesia Quick Evaluation

## 2022-06-16 NOTE — Consult Note (Signed)
ANTICOAGULATION CONSULT NOTE   Pharmacy Consult for Heparin Indication: CAD - awaiting CABG  No Known Allergies  Patient Measurements:   Heparin Dosing Weight: 61.2 kg  Vital Signs: Temp: 98.4 F (36.9 C) (03/20 1204) Temp Source: Oral (03/20 1204) BP: 101/89 (03/20 1300) Pulse Rate: 64 (03/20 1300)  Labs: Recent Labs    06/15/22 1553 06/15/22 1803 06/16/22 0612 06/16/22 1308  HGB 14.7  --  14.3  --   HCT 44.0  --  41.2  --   PLT 223  --  205  --   APTT 27  --   --   --   LABPROT 14.4  --   --   --   INR 1.1  --   --   --   HEPARINUNFRC  --   --  <0.10* 0.19*  CREATININE 1.13  --  1.03  --   TROPONINIHS 21* 101*  --   --      Estimated Creatinine Clearance: 75.2 mL/min (by C-G formula based on SCr of 1.03 mg/dL).   Assessment: Pt s/p cath which showed complete ostial/proximal LAD disease - PCI would be high risk. CVTS consult for CABG, heparin resumed after TR band removal.  Heparin level subtherapeutic at 0.19, no infusion issues per nursing.   Goal of Therapy:  Heparin level 0.3-0.7 units/ml Monitor platelets by anticoagulation protocol: Yes   Plan:  Increase heparin to 1100 units/h Recheck heparin level in 6h  Arrie Senate, PharmD, [place], [place] Pharmacist [phone] Please check AMION for all [facility] Pharmacy numbers 06/16/2022

## 2022-06-16 NOTE — Progress Notes (Signed)
     [address]       [place] [address]             [phone]       Echo this am shows at least moderate AI. Poor dentition Will need full work-up. Likely will plan for AVR/CABG2  Case will need to be postponed  Ian Gross

## 2022-06-16 NOTE — TOC Initial Note (Signed)
Transition of Care [facility]) - Initial/Assessment Note    Patient Details  Name: Ian Gross MRN: [id number] Date of Birth: February 04, 1976  Transition of Care [facility]) CM/SW Contact:    Carles Collet, RN Phone Number: 06/16/2022, 2:18 PM  Clinical Narrative:                  Spoke w patient over the phone.  Discussed home support after CABG. He lives at home w wife and daughter. Independent prior to admission.  CABG pending work up PCP [place] Medicare TOC will continue to follow  Expected Discharge Plan: Home/Self Care Barriers to Discharge: Continued Medical Work up   Patient Goals and CMS Choice Patient states their goals for this hospitalization and ongoing recovery are:: to go home          Expected Discharge Plan and Services   Discharge Planning Services: CM Consult   Living arrangements for the past 2 months: Apartment                                      Prior Living Arrangements/Services Living arrangements for the past 2 months: Apartment Lives with:: Spouse, Minor Children                   Activities of Daily Living      Permission Sought/Granted                  Emotional Assessment              Admission diagnosis:  NSTEMI (non-ST elevated myocardial infarction) [facility]) [I21.4] Patient Active Problem List   Diagnosis Date Noted   NSTEMI (non-ST elevated myocardial infarction) ([place]) 06/15/2022   DM2 (diabetes mellitus, type 2) ([place]) 06/02/2021   History of splenectomy 06/02/2021   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 [address] Phone: [phone] Fax: [phone]     Social Determinants of Health (SDOH) Social History: SDOH Screenings   Food Insecurity: No Food Insecurity (09/03/2021)  Housing: Low Risk  (09/03/2021)  Transportation Needs: No Transportation Needs  (09/03/2021)  Alcohol Screen: Low Risk  (06/02/2021)  Depression (PHQ2-9): Medium Risk (06/02/2021)  Financial Resource Strain: Low Risk  (09/03/2021)  Social Connections: Moderately Isolated (09/03/2021)  Stress: No Stress Concern Present (09/03/2021)  Tobacco Use: High Risk (06/16/2022)   SDOH Interventions:     Readmission Risk Interventions     No data to display

## 2022-06-16 NOTE — Discharge Instructions (Addendum)
Discharge Instructions:  1. You may shower, please wash incisions daily with soap and water and keep dry.  If you wish to cover wounds with dressing you may do so but please keep clean and change daily.  No tub baths or swimming until incisions have completely healed.  If your incisions become red or develop any drainage please call our office at [phone]  2. No Driving until cleared by Dr. Lightfoot's office and you are no longer using narcotic pain medications  3. Monitor your weight daily.. Please use the same scale and weigh at same time... If you gain 5-10 lbs in 48 hours with associated lower extremity swelling, please contact our office at [phone]  4. Fever of 101.5 for at least 24 hours with no source, please contact our office at [phone]  5. Activity- up as tolerated, please walk at least 3 times per day.  Avoid strenuous activity, no lifting, pushing, or pulling with your arms over 8-10 lbs for a minimum of 6 weeks  6. If any questions or concerns arise, please do not hesitate to contact our office at [phone]   ------------------------------------------------------------------------------------------------------------------------------------------------ Information on my medicine - Coumadin   (Warfarin)  Why was Coumadin prescribed for you? Coumadin was prescribed for you because you have a blood clot or a medical condition that can cause an increased risk of forming blood clots. Blood clots can cause serious health problems by blocking the flow of blood to the heart, lung, or brain. Coumadin can prevent harmful blood clots from forming. As a reminder your indication for Coumadin is:  Blood Clot Prevention after Heart Valve Surgery  What test will check on my response to Coumadin? While on Coumadin (warfarin) you will need to have an INR test regularly to ensure that your dose is keeping you in the desired range. The INR (international normalized ratio) number is  calculated from the result of the laboratory test called prothrombin time (PT).  If an INR APPOINTMENT HAS NOT ALREADY BEEN MADE FOR YOU please schedule an appointment to have this lab work done by your health care provider within 7 days. Your INR goal is usually a number between:  2 to 3 or your provider may give you a more narrow range like 2-2.5.  Ask your health care provider during an office visit what your goal INR is.  What  do you need to  know  About  COUMADIN? Take Coumadin (warfarin) exactly as prescribed by your healthcare provider about the same time each day.  DO NOT stop taking without talking to the doctor who prescribed the medication.  Stopping without other blood clot prevention medication to take the place of Coumadin may increase your risk of developing a new clot or stroke.  Get refills before you run out.  What do you do if you miss a dose? If you miss a dose, take it as soon as you remember on the same day then continue your regularly scheduled regimen the next day.  Do not take two doses of Coumadin at the same time.  Important Safety Information A possible side effect of Coumadin (Warfarin) is an increased risk of bleeding. You should call your healthcare provider right away if you experience any of the following: Bleeding from an injury or your nose that does not stop. Unusual colored urine (red or dark brown) or unusual colored stools (red or black). Unusual bruising for unknown reasons. A serious fall or if you hit your head (even if there is no   bleeding).  Some foods or medicines interact with Coumadin (warfarin) and might alter your response to warfarin. To help avoid this: Eat a balanced diet, maintaining a consistent amount of Vitamin K. Notify your provider about major diet changes you plan to make. Avoid alcohol or limit your intake to 1 drink for women and 2 drinks for men per day. (1 drink is 5 oz. wine, 12 oz. beer, or 1.5 oz. liquor.)  Make sure that  ANY health care provider who prescribes medication for you knows that you are taking Coumadin (warfarin).  Also make sure the healthcare provider who is monitoring your Coumadin knows when you have started a new medication including herbals and non-prescription products.  Coumadin (Warfarin)  Major Drug Interactions  Increased Warfarin Effect Decreased Warfarin Effect  Alcohol (large quantities) Antibiotics (esp. Septra/Bactrim, Flagyl, Cipro) Amiodarone (Cordarone) Aspirin (ASA) Cimetidine (Tagamet) Megestrol (Megace) NSAIDs (ibuprofen, naproxen, etc.) Piroxicam (Feldene) Propafenone (Rythmol SR) Propranolol (Inderal) Isoniazid (INH) Posaconazole (Noxafil) Barbiturates (Phenobarbital) Carbamazepine (Tegretol) Chlordiazepoxide (Librium) Cholestyramine (Questran) Griseofulvin Oral Contraceptives Rifampin Sucralfate (Carafate) Vitamin K   Coumadin (Warfarin) Major Herbal Interactions  Increased Warfarin Effect Decreased Warfarin Effect  Garlic Ginseng Ginkgo biloba Coenzyme Q10 Green tea St. John's wort    Coumadin (Warfarin) FOOD Interactions  Eat a consistent number of servings per week of foods HIGH in Vitamin K (1 serving =  cup)  Collards (cooked, or boiled & drained) Kale (cooked, or boiled & drained) Mustard greens (cooked, or boiled & drained) Parsley *serving size only =  cup Spinach (cooked, or boiled & drained) Swiss chard (cooked, or boiled & drained) Turnip greens (cooked, or boiled & drained)  Eat a consistent number of servings per week of foods MEDIUM-HIGH in Vitamin K (1 serving = 1 cup)  Asparagus (cooked, or boiled & drained) Broccoli (cooked, boiled & drained, or raw & chopped) Brussel sprouts (cooked, or boiled & drained) *serving size only =  cup Lettuce, raw (green leaf, endive, romaine) Spinach, raw Turnip greens, raw & chopped   These websites have more information on Coumadin (warfarin):   www.coumadin.com; www.ahrq.gov/consumer/coumadin.htm;   

## 2022-06-16 NOTE — Progress Notes (Signed)
ANTICOAGULATION CONSULT NOTE - Follow Up Consult  Pharmacy Consult for heparin Indication:  CAD awaiting CABG consult  Labs: Recent Labs    06/15/22 1553 06/15/22 1803 06/16/22 0612  HGB 14.7  --  14.3  HCT 44.0  --  41.2  PLT 223  --  205  APTT 27  --   --   LABPROT 14.4  --   --   INR 1.1  --   --   HEPARINUNFRC  --   --  <0.10*  CREATININE 1.13  --  1.03  TROPONINIHS 21* 101*  --     Assessment: 47yo male subtherapeutic on heparin with initial dosing post-cath; no infusion issues or signs of bleeding per RN.  Goal of Therapy:  Heparin level 0.3-0.7 units/ml   Plan:  Will increase heparin infusion by 3-4 units/kg/hr to 900 units/hr and check level in 6 hours.    Wynona Neat, PharmD, BCPS  06/16/2022,7:06 AM

## 2022-06-16 NOTE — Progress Notes (Deleted)
   Subjective:    Patient ID: Ian Gross, male    DOB: October 20, 1975, 47 y.o.   MRN: [id number]  HPI  Patient presents to clinic today for follow-up of chronic conditions.  DM2: His last A1c was.  He is taking Lantus and Humalog as prescribed.  He does not check his sugars routinely.  He checks his feet routinely.  His last eye exam was.  Flu never.  Pneumovax unsure.  COVID never.  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  He follows with the Suboxone clinic.  History of pancreatectomy, splenectomy: He takes Creon as prescribed.  He follows with [facility].  Review of Systems     Objective:   Physical Exam        Assessment & Plan:

## 2022-06-16 NOTE — Consult Note (Signed)
ANTICOAGULATION CONSULT NOTE   Pharmacy Consult for Heparin Indication: CAD - awaiting CABG  No Known Allergies  Patient Measurements:   Heparin Dosing Weight: 61.2 kg  Vital Signs: Temp: 98.2 F (36.8 C) (03/20 2201) Temp Source: Oral (03/20 2201) BP: 122/64 (03/20 2201) Pulse Rate: 60 (03/20 2201)  Labs: Recent Labs    06/15/22 1553 06/15/22 1803 06/16/22 0612 06/16/22 1308 06/16/22 2246  HGB 14.7  --  14.3  --   --   HCT 44.0  --  41.2  --   --   PLT 223  --  205  --   --   APTT 27  --   --   --   --   LABPROT 14.4  --   --   --   --   INR 1.1  --   --   --   --   HEPARINUNFRC  --   --  <0.10* 0.19* 0.55  CREATININE 1.13  --  1.03  --   --   TROPONINIHS 21* 101*  --   --   --      Estimated Creatinine Clearance: 75.2 mL/min (by C-G formula based on SCr of 1.03 mg/dL).   Assessment: Pt s/p cath which showed complete ostial/proximal LAD disease - PCI would be high risk. CVTS consult for CABG, heparin resumed after TR band removal.  Heparin level subtherapeutic at 0.19, no infusion issues per nursing.   3/20 PM update:  Heparin level therapeutic after rate increase  Goal of Therapy:  Heparin level 0.3-0.7 units/ml Monitor platelets by anticoagulation protocol: Yes   Plan:  Cont heparin 1100 units/hr Heparin level with AM labs  Narda Bonds, PharmD, [place] Pharmacist Phone: [phone]

## 2022-06-16 NOTE — Consult Note (Signed)
[address]       [place],Ian Gross [address]             [phone]        Leevon Filippone [facility] Medical Record [id number] Date of Birth: 01/22/1976  Referring: Nelva Bush, MD Primary Care: Jearld Fenton, NP Primary Cardiologist:None  Chief Complaint:   No chief complaint on file.   History of Present Illness:     47yo male transferred from [place] after presenting with a STEMI.  LHC showed proximal LAD disease close to the take off of the diagonal and ramus.  CTS was consulted to assist with management.    He denies any pain currently   Past Medical and Surgical History: Previous Chest Surgery: none Previous Chest Radiation: no Diabetes Mellitus: yes.  HbA1C 7.5 Creatinine: 1.03  Past Medical History:  Diagnosis Date   Diabetes mellitus with complication [facility])     Past Surgical History:  Procedure Laterality Date   LEFT HEART CATH AND CORONARY ANGIOGRAPHY N/A 06/15/2022   Procedure: LEFT HEART CATH AND CORONARY ANGIOGRAPHY;  Surgeon: Nelva Bush, MD;  Location: [place] [facility];  Service: Cardiovascular;  Laterality: N/A;   PANCREATECTOMY N/A    SPLENECTOMY        Social History   Tobacco Use  Smoking Status Every Day   Packs/day: .75   Types: Cigarettes  Smokeless Tobacco Never    Social History   Substance and Sexual Activity  Alcohol Use Not Currently     No Known Allergies    Current Facility-Administered Medications  Medication Dose Route Frequency Provider Last Rate Last Admin   acetaminophen (TYLENOL) tablet 650 mg  650 mg Oral Q4H PRN Silas Flood, MD       aspirin EC tablet 81 mg  81 mg Oral Daily Silas Flood, MD       atorvastatin (LIPITOR) tablet 80 mg  80 mg Oral Daily Cosiano, Legrand Como, MD       bisacodyl (DULCOLAX) EC tablet 5 mg  5 mg Oral Once Keidan Aumiller, Lucile Crater, MD       buprenorphine-naloxone (SUBOXONE) 8-2 mg per SL tablet 1 tablet  1 tablet Sublingual Daily Cosiano, Michael, MD        chlorhexidine (PERIDEX) 0.12 % solution 15 mL  15 mL Mouth/Throat Once Carleton [place], Lucile Crater, MD       Chlorhexidine Gluconate Cloth 2 % PADS 6 each  6 each Topical Daily Buford Dresser, MD   6 each at 06/15/22 2142   heparin ADULT infusion 100 units/mL (25000 units/270mL)  900 Units/hr Intravenous Continuous Laren Everts, RPH 9 mL/hr at 06/16/22 0734 900 Units/hr at 06/16/22 0734   insulin aspart (novoLOG) injection 0-6 Units  0-6 Units Subcutaneous TID WC Silas Flood, MD   2 Units at 06/15/22 2330   insulin glargine-yfgn (SEMGLEE) injection 6 Units  6 Units Subcutaneous QHS Silas Flood, MD   6 Units at 06/15/22 2330   lipase/protease/amylase (CREON) capsule 72,000 Units  72,000 Units Oral TID Silas Flood, MD   72,000 Units at 06/15/22 2343   metoprolol succinate (TOPROL-XL) 24 hr tablet 12.5 mg  12.5 mg Oral Daily Silas Flood, MD       metoprolol tartrate (LOPRESSOR) tablet 12.5 mg  12.5 mg Oral Once Casten Floren, Lucile Crater, MD       nitroGLYCERIN (NITROSTAT) SL tablet 0.4 mg  0.4 mg Sublingual Q5 Min x 3 PRN Silas Flood, MD  ondansetron (ZOFRAN) injection 4 mg  4 mg Intravenous Q6H PRN Silas Flood, MD       Oral care mouth rinse  15 mL Mouth Rinse PRN Buford Dresser, MD       temazepam (RESTORIL) capsule 15 mg  15 mg Oral Once PRN Lajuana Matte, MD        Medications Prior to Admission  Medication Sig Dispense Refill Last Dose   Buprenorphine HCl-Naloxone HCl 8-2 MG FILM Place 1 strip under the tongue 2 (two) times daily.      HUMALOG KWIKPEN 100 UNIT/ML KwikPen Inject 4-9 Units into the skin 3 (three) times daily. Sliding scale (go up by 1 unit)      insulin glargine (LANTUS) 100 UNIT/ML injection Inject 12 Units into the skin at bedtime.      Pancrelipase, Lip-Prot-Amyl, (CREON) 24000-76000 units CPEP Take 3 capsules (72,000 Units total) by mouth 3 (three) times daily. 200 capsule 1     Family History  Problem Relation Age of  Onset   Lung cancer Father    Healthy Sister    Healthy Brother    CVA Maternal Grandmother    Liver cancer Maternal Grandfather    Alcohol abuse Maternal Grandfather      Review of Systems:   Review of Systems  Respiratory:  Positive for shortness of breath.   Cardiovascular:  Positive for chest pain.  Neurological: Negative.       Physical Exam: BP (!) 110/59 (BP Location: Left Arm)   Pulse (!) 58   Temp 97.9 F (36.6 C) (Oral)   Resp 13   SpO2 97%  Physical Exam Constitutional:      Appearance: Normal appearance. He is normal weight.  Cardiovascular:     Rate and Rhythm: Normal rate and regular rhythm.  Pulmonary:     Effort: Pulmonary effort is normal.  Abdominal:     General: Abdomen is flat. There is no distension.  Musculoskeletal:        General: Normal range of motion.     Cervical back: Normal range of motion.  Skin:    General: Skin is warm and dry.  Neurological:     General: No focal deficit present.     Mental Status: He is alert and oriented to person, place, and time.       Diagnostic Studies & Laboratory data:    Left Heart Catherization:  Intervention  Echo: pending EKG: sinus I have independently reviewed the above radiologic studies and discussed with the patient   Recent Lab Findings: Lab Results  Component Value Date   WBC 11.7 (H) 06/16/2022   HGB 14.3 06/16/2022   HCT 41.2 06/16/2022   PLT 205 06/16/2022   GLUCOSE 87 06/16/2022   CHOL 98 06/15/2022   TRIG 58 06/15/2022   HDL 48 06/15/2022   LDLCALC 38 06/15/2022   ALT 32 06/15/2022   AST 48 (H) 06/15/2022   NA 140 06/16/2022   K 4.2 06/16/2022   CL 105 06/16/2022   CREATININE 1.03 06/16/2022   BUN 14 06/16/2022   CO2 24 06/16/2022   TSH 3.113 06/16/2022   INR 1.1 06/15/2022   HGBA1C 7.5 (H) 12/09/2021      Assessment / Plan:   47yo male with proximal LAD disease. Echo pending OR today for CABG 2     I  spent 40 minutes counseling the patient face to  face.   Lajuana Matte 06/16/2022 8:02 AM

## 2022-06-16 NOTE — Progress Notes (Signed)
Rounding Note    Patient Name: Ian Gross Date of Encounter: 06/16/2022  [place] Cardiologist: Nelva Bush, MD   Subjective   Planned for CABG today. However, echo images (final read pending) concerning for at least moderate AR. Given this, CABG has been delayed to allow for further workup. He denies any chest pain. Had nausea/vomiting this AM but now improved.  Inpatient Medications    Scheduled Meds:  aspirin EC  81 mg Oral Daily   atorvastatin  80 mg Oral Daily   buprenorphine-naloxone  1 tablet Sublingual Daily   Chlorhexidine Gluconate Cloth  6 each Topical Daily   epinephrine  0-10 mcg/min Intravenous To OR   heparin sodium (porcine) 2,500 Units, papaverine 30 mg in electrolyte-A (PLASMALYTE-A PH 7.4) 500 mL irrigation   Irrigation To OR   insulin aspart  0-6 Units Subcutaneous TID WC   insulin glargine-yfgn  6 Units Subcutaneous QHS   insulin   Intravenous To OR   [facility] Blood Cardioplegia vial (lidocaine/magnesium/mannitol 0.26g-4g-6.4g)   Intracoronary To OR   lipase/protease/amylase  72,000 Units Oral TID   metoprolol succinate  12.5 mg Oral Daily   phenylephrine  30-200 mcg/min Intravenous To OR   potassium chloride  80 mEq Other To OR   tranexamic acid  15 mg/kg Intravenous To OR   tranexamic acid  2 mg/kg Intracatheter To OR   Continuous Infusions:   ceFAZolin (ANCEF) IV      ceFAZolin (ANCEF) IV     dexmedetomidine     heparin 30,000 units/NS 1000 mL solution for CELLSAVER     heparin 900 Units/hr (06/16/22 0900)   milrinone     nitroGLYCERIN     norepinephrine     tranexamic acid (CYKLOKAPRON) 2,500 mg in sodium chloride 0.9 % 250 mL (10 mg/mL) infusion     vancomycin     PRN Meds: acetaminophen, nitroGLYCERIN, ondansetron (ZOFRAN) IV, mouth rinse, temazepam   Vital Signs    Vitals:   06/16/22 0500 06/16/22 0700 06/16/22 0800 06/16/22 0900  BP:  132/66 132/70 123/65  Pulse:  77 71 72  Resp:  13 15 11   Temp: 97.9 F  (36.6 C)     TempSrc: Oral     SpO2:  98% 98% 99%    Intake/Output Summary (Last 24 hours) at 06/16/2022 1136 Last data filed at 06/16/2022 0900 Gross per 24 hour  Intake 68.33 ml  Output 500 ml  Net -431.67 ml      06/15/2022    5:45 PM 06/15/2022    3:55 PM 12/09/2021   10:38 AM  Last 3 Weights  Weight (lbs) 135 lb 2.3 oz 135 lb 129 lb  Weight (kg) 61.3 kg 61.236 kg 58.514 kg      Telemetry    SR - Personally Reviewed  ECG    Sinus bradycardia at 59 bpm. Anteroseptal borderline ST elevation with  inferolateral t wave inversions- Personally Reviewed  Physical Exam   GEN: No acute distress.   Neck: No JVD Cardiac: RRR, no murmurs, rubs, or gallops.  Respiratory: Clear to auscultation bilaterally. GI: Soft, nontender, non-distended  MS: No edema; No deformity. Neuro:  Nonfocal  Psych: Normal affect   Labs    High Sensitivity Troponin:   Recent Labs  Lab 06/15/22 1553 06/15/22 1803  TROPONINIHS 21* 101*     Chemistry Recent Labs  Lab 06/15/22 1553 06/16/22 0612  NA 140 140  K 4.0 4.2  CL 102 105  CO2 28 24  GLUCOSE  147* 87  BUN 16 14  CREATININE 1.13 1.03  CALCIUM 8.8* 8.5*  PROT 6.0*  --   ALBUMIN 3.4*  --   AST 48*  --   ALT 32  --   ALKPHOS 147*  --   BILITOT 0.6  --   GFRNONAA >60 >60  ANIONGAP 10 11    Lipids  Recent Labs  Lab 06/15/22 1553  CHOL 98  TRIG 58  HDL 48  LDLCALC 38  CHOLHDL 2.0    Hematology Recent Labs  Lab 06/15/22 1553 06/16/22 0612  WBC 12.1* 11.7*  RBC 4.47 4.26  HGB 14.7 14.3  HCT 44.0 41.2  MCV 98.4 96.7  MCH 32.9 33.6  MCHC 33.4 34.7  RDW 13.7 13.9  PLT 223 205   Thyroid  Recent Labs  Lab 06/16/22 0612  TSH 3.113    BNPNo results for input(s): "BNP", "PROBNP" in the last 168 hours.  DDimer No results for input(s): "DDIMER" in the last 168 hours.   Radiology    DG Orthopantogram  Result Date: 06/16/2022 CLINICAL DATA:  Poor dentition EXAM: ORTHOPANTOGRAM/PANORAMIC COMPARISON:  None  Available. FINDINGS: Motion artifact related to Panorex technique. Suspected large dental cavity along the mesial border of tooth # 30. Smaller suspected adjacent cavity along the distal margin tooth # 29. Potential chip or lucency along the occlusal margin of tooth # 10. Tooth # 19 is absent. Other subtle irregularities along the occlusal margins of various teeth including tooth # 6 and tooth # 21, correlate with visual inspection. IMPRESSION: 1. Tooth decay related findings as noted above. Electronically Signed   By: Van Clines M.D.   On: 06/16/2022 10:51   CARDIAC CATHETERIZATION  Addendum Date: 06/15/2022   Conclusions: Severe complex ostial/proximal LAD involving moderate-caliber D1 that is difficult to visualize due to vessel overlap.  Lesion appears to be an acute plaque rupture with up to 90% stenosis involving the LAD and 75% stenosis involving D1 leading to the patient's NSTEMI.  LMCA, ramus intermedius, LCx, and RCA are without significant disease. Mildly reduced left ventricular systolic function with mild mid and apical anterior and apical inferior hypokinesis (LVEF 45-50%). Normal left ventricular filling pressure (LVEDP 6 mmHg). Recommendations: Case reviewed with Dr. Ellyn Hack (interventional cardiology) and Dr. Kipp Brood (cardiothoracic surgery).  Given proximal LAD disease involving sizeable D1 and adjacent ramus intermedius branch, PCI would be high risk.  In the setting of his DM and LAD/D1 disease, we have agreed that CABG may be the best revascularization option.  Given that Ian Gross is currently chest pain free and hemodynamically stable, we will transfer him to [facility] for urgent evaluation and possible CABG as soon as tomorrow. Start IV heparin 2 hours after TR band deflation. High-intensity statin therapy. Aspirin 81 mg daily; defer P2Y12 inhibitor pending cardiac surgery evaluation. Start metoprolol tartrate 12.5 mg twice daily. Nelva Bush, MD [facility]  Result  Date: 06/15/2022 Conclusions: Severe complex ostial/proximal LAD involving moderate-caliber D1 that is difficult to visualize due to vessel overlap.  Lesion appears to be an acute plaque rupture with up to 90% stenosis involving the LAD and 75% stenosis involving D1 leading to the patient's NSTEMI.  LMCA, ramus intermedius, LCx, and RCA are without significant disease. Mildly reduced left ventricular systolic function with mild mid and apical anterior and apical inferior hypokinesis (LVEF 45-50%). Normal left ventricular filling pressure (LVEDP 6 mmHg). Recommendations: Case reviewed with Dr. Ellyn Hack (interventional cardiology) and Dr. Kipp Brood (cardiothoracic surgery).  Given proximal LAD disease involving  sizeable D1 and adjacent ramus intermedius branch, PCI would be high risk.  In the setting of his DM and LAD/D1 disease, we have agreed that CABG may be the best revascularization option.  Given that Ian Gross is currently chest pain free and hemodynamically stable, we will transfer him to [facility] for urgent evaluation and possible CABG as soon as tomorrow. Start IV heparin 2 hours after TR band deflation. High-intensity statin therapy. Aspirin 81 mg daily; defer P2Y12 inhibitor pending cardiac surgery evaluation. Start metoprolol tartrate 12.5 mg twice daily. Nelva Bush, MD [facility]   Cardiac Studies   LHC 06/15/22:   Conclusions: Severe complex ostial/proximal LAD involving moderate-caliber D1 that is difficult to visualize due to vessel overlap.  Lesion appears to be an acute plaque rupture with up to 90% stenosis involving the LAD and 75% stenosis involving D1 leading to the patient's NSTEMI.  LMCA, ramus intermedius, LCx, and RCA are without significant disease. Mildly reduced left ventricular systolic function with mild mid and apical anterior and apical inferior hypokinesis (LVEF 45-50%). Normal left ventricular filling pressure (LVEDP 6 mmHg).   Recommendations: Case reviewed  with Dr. Ellyn Hack (interventional cardiology) and Dr. Kipp Brood (cardiothoracic surgery).  Given proximal LAD disease involving sizeable D1 and adjacent ramus intermedius branch, PCI would be high risk.  In the setting of his DM and LAD/D1 disease, we have agreed that CABG may be the best revascularization option.  Given that Ian Gross is currently chest pain free and hemodynamically stable, we will transfer him to [facility] for urgent evaluation and possible CABG as soon as tomorrow. Start IV heparin 2 hours after TR band deflation. High-intensity statin therapy. Aspirin 81 mg daily; defer P2Y12 inhibitor pending cardiac surgery evaluation. Start metoprolol tartrate 12.5 mg twice daily.  Echo pending  Patient Profile     47 y.o. 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 Taken to cath lab, found to have severe proximal LAD disease, transferred to [facility] for CABG.  Assessment & Plan    Chest pain Anterior STEMI Severe proximal LAD disease -chest pain free -initially planned for CABG 06/16/22, but echo images (pending) concerning for at least moderate AR. Surgery postponed, needs further evaluation, may need AVR with CABG -continue aspirin, statin, heparin, beta blocker -will ask if TEE needed prior to surgery or if current images are adequate -further CABG workup per CT surgery  Hypertension -well controlled to low currently.  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

## 2022-06-16 NOTE — Hospital Course (Addendum)
History of Present Illness:  Ian Gross is a 47 yo male with H/O of Pancreatectomy, DM, H/O Splenectomy, and chronic pain.  He was brought into the [facility] ED via EMS with complaints of chest pain.  This began yesterday morning and was associated with nausea.  The pain initially resolved, but returned and got worse.  He developed shortness of breath, diaphoresis, and numbness bilaterally.  He received ASA and NTG enroute to the hospital.  EKG obtained by EMS showed anterior ST elevation and code STEMI was activated.  Repeat EKG in the ED showed non-specific changes.  There was concern the patient had unstable plaque in his LAD which is felt to be source of chest pain and EKG changes.  He was evaluated by Dr. Okey Dupre in the ED who recommended catheterization.  The risks and benefits of the procedure were explained to the patient and he was agreeable to proceed.  This was performed and revealed LM involvement with acute plaque rupture with 90% stenosis involving LAD and 75% involving D1.  It was felt coronary bypass grafting would the best treatment option.  The patient was transferred to [facility] for further care.  Hospital Course:  The patient was admitted to the ICU.  The patient was chest pain free on admission.  He was evaluated by Dr. Cliffton Asters who recommended coronary bypass grafting procedure;however echocardiogram obtained revealed severe AI.  The patient had poor dentition and required dental consultation. Multiple teeth were extracted on 06/23/2022. The patient underwent CABG x 2 and mechanical AVR. He was extubated early evening of surgery. He was weaned off Neo Synephrine drip. He had post op blood loss anemia and was transfused. He had thrombocytopenia as well. Platelets went as low as 82,000. He was started on Coumadin on 04/02 and PT/INR were monitored daily. He was transitioned off the Insulin drip. His pre op HGA1C is 7.9. He was volume overloaded and diuresed accordingly. He was  felt surgically stable for transfer from the ICU to the floor on 04/03.  He developed a drop in his hemoglobin level to 6.2.  Occult stool was checked and was ***.  Repeat H/H confirmed ***.  He did *** require transfusion.  He was resumed on coumadin on 07/03/2022.  His INR is *** and he will require *** mg of coumadin at discharge.  He has been tolerating a diet. He was ambulating on room air with good oxygenation.  He was tolerating a diet and had a bowel movment. All wounds are clean, dry, healing without signs of infection.

## 2022-06-16 NOTE — Progress Notes (Signed)
CARDIAC REHAB PHASE I      Pre-op OHS education including OHS handout, OHS booklet, sternal precautions, home needs at discharge, mobility and IS use reviewed. IS today 2000. All questions and concerns addressed. Will continue to follow.   [id number]   Vanessa Barbara, RN BSN 06/16/2022 10:34 AM

## 2022-06-17 ENCOUNTER — Inpatient Hospital Stay (HOSPITAL_COMMUNITY): Payer: Medicare Other

## 2022-06-17 ENCOUNTER — Other Ambulatory Visit (HOSPITAL_COMMUNITY): Payer: Medicare Other

## 2022-06-17 DIAGNOSIS — E139 Other specified diabetes mellitus without complications: Secondary | ICD-10-CM

## 2022-06-17 LAB — PULMONARY FUNCTION TEST
FEF 25-75 Pre: 2.69 L/sec
FEF2575-%Pred-Pre: 72 %
FEV1-%Pred-Pre: 79 %
FEV1-Pre: 3.34 L
FEV1FVC-%Pred-Pre: 96 %
FEV6-%Pred-Pre: 84 %
FEV6-Pre: 4.42 L
FEV6FVC-%Pred-Pre: 102 %
FVC-%Pred-Pre: 82 %
FVC-Pre: 4.45 L
Pre FEV1/FVC ratio: 75 %
Pre FEV6/FVC Ratio: 99 %

## 2022-06-17 LAB — GLUCOSE, CAPILLARY
Glucose-Capillary: 142 mg/dL — ABNORMAL HIGH (ref 70–99)
Glucose-Capillary: 256 mg/dL — ABNORMAL HIGH (ref 70–99)
Glucose-Capillary: 214 mg/dL — ABNORMAL HIGH (ref 70–99)
Glucose-Capillary: 272 mg/dL — ABNORMAL HIGH (ref 70–99)

## 2022-06-17 LAB — BASIC METABOLIC PANEL
Anion gap: 5 (ref 5–15)
BUN: 13 mg/dL (ref 6–20)
CO2: 27 mmol/L (ref 22–32)
Calcium: 8.1 mg/dL — ABNORMAL LOW (ref 8.9–10.3)
Chloride: 103 mmol/L (ref 98–111)
Creatinine, Ser: 1 mg/dL (ref 0.61–1.24)
GFR, Estimated: >60 mL/min (ref >60)
Glucose, Bld: 166 mg/dL — ABNORMAL HIGH (ref 70–99)
Potassium: 4.2 mmol/L (ref 3.5–5.1)
Sodium: 135 mmol/L (ref 135–145)

## 2022-06-17 LAB — CBC
HCT: 38.4 % — ABNORMAL LOW (ref 39.0–52.0)
Hemoglobin: 12.8 g/dL — ABNORMAL LOW (ref 13.0–17.0)
MCH: 32.7 pg (ref 26.0–34.0)
MCHC: 33.3 g/dL (ref 30.0–36.0)
MCV: 98.2 fL (ref 80.0–100.0)
Platelets: 209 10*3/uL (ref 150–400)
RBC: 3.91 MIL/uL — ABNORMAL LOW (ref 4.22–5.81)
RDW: 13.5 % (ref 11.5–15.5)
WBC: 8.7 10*3/uL (ref 4.0–10.5)
nRBC: 0 % (ref 0.0–0.2)

## 2022-06-17 LAB — LIPOPROTEIN A (LPA)
Lipoprotein (a): 19 nmol/L (ref <75.0)
Lipoprotein (a): 19.6 nmol/L (ref <75.0)

## 2022-06-17 LAB — HEPARIN LEVEL (UNFRACTIONATED): Heparin Unfractionated: 0.57 IU/mL (ref 0.30–0.70)

## 2022-06-17 MED ORDER — INSULIN ASPART 100 UNIT/ML IJ SOLN
2.0000 [IU] | Freq: Three times a day (TID) | INTRAMUSCULAR | Status: DC
  Administered 2022-06-17 – 2022-06-18 (×3): 2 [IU] via SUBCUTANEOUS

## 2022-06-17 MED ORDER — PROPOFOL 1000 MG/100ML IV EMUL
INTRAVENOUS | Status: AC
  Filled 2022-06-17: qty 100

## 2022-06-17 NOTE — Progress Notes (Signed)
Rounding Note    Patient Name: Ian Gross Date of Encounter: 06/17/2022  [place] Cardiologist: Nelva Bush, MD   Subjective   No acute events overnight. No chest pain. Reviewed plan for CABG/AVR, awaiting scheduling of this.  Inpatient Medications    Scheduled Meds:  aspirin EC  81 mg Oral Daily   atorvastatin  80 mg Oral Daily   buprenorphine-naloxone  1 tablet Sublingual Daily   Chlorhexidine Gluconate Cloth  6 each Topical Daily   insulin aspart  0-6 Units Subcutaneous TID WC   insulin aspart  2 Units Subcutaneous TID WC   insulin glargine-yfgn  6 Units Subcutaneous QHS   lipase/protease/amylase  72,000 Units Oral TID   metoprolol succinate  12.5 mg Oral Daily   Continuous Infusions:  heparin 1,100 Units/hr (06/17/22 0247)   PRN Meds: acetaminophen, nitroGLYCERIN, ondansetron (ZOFRAN) IV, mouth rinse   Vital Signs    Vitals:   06/16/22 2300 06/17/22 0249 06/17/22 0736 06/17/22 0928  BP: 106/77 118/66 108/65 124/61  Pulse: (!) 58 62 (!) 56 64  Resp: 13 14 15    Temp:  97.7 F (36.5 C) 98 F (36.7 C)   TempSrc:  Oral Oral   SpO2: 97% 99% 96%     Intake/Output Summary (Last 24 hours) at 06/17/2022 1004 Last data filed at 06/17/2022 0300 Gross per 24 hour  Intake 35.85 ml  Output 800 ml  Net -764.15 ml      06/15/2022    5:45 PM 06/15/2022    3:55 PM 12/09/2021   10:38 AM  Last 3 Weights  Weight (lbs) 135 lb 2.3 oz 135 lb 129 lb  Weight (kg) 61.3 kg 61.236 kg 58.514 kg      Telemetry    SR/sinus bradycardia - Personally Reviewed  ECG    Sinus bradycardia at 59 bpm. Anteroseptal borderline ST elevation with  inferolateral t wave inversions- Personally Reviewed  Physical Exam   GEN: Well nourished, well developed in no acute distress NECK: No JVD CARDIAC: regular rhythm, normal S1 and S2, no rubs or gallops. No murmur. VASCULAR: Radial pulses 2+ bilaterally.  RESPIRATORY:  Clear to auscultation without rales, wheezing or  rhonchi  ABDOMEN: Soft, non-tender, non-distended MUSCULOSKELETAL:  Moves all 4 limbs independently SKIN: Warm and dry, no edema NEUROLOGIC:  No focal neuro deficits noted. PSYCHIATRIC:  Normal affect    Labs    High Sensitivity Troponin:   Recent Labs  Lab 06/15/22 1553 06/15/22 1803  TROPONINIHS 21* 101*     Chemistry Recent Labs  Lab 06/15/22 1553 06/16/22 0612 06/17/22 0516  NA 140 140 135  K 4.0 4.2 4.2  CL 102 105 103  CO2 28 24 27   GLUCOSE 147* 87 166*  BUN 16 14 13   CREATININE 1.13 1.03 1.00  CALCIUM 8.8* 8.5* 8.1*  PROT 6.0*  --   --   ALBUMIN 3.4*  --   --   AST 48*  --   --   ALT 32  --   --   ALKPHOS 147*  --   --   BILITOT 0.6  --   --   GFRNONAA >60 >60 >60  ANIONGAP 10 11 5     Lipids  Recent Labs  Lab 06/15/22 1553  CHOL 98  TRIG 58  HDL 48  LDLCALC 38  CHOLHDL 2.0    Hematology Recent Labs  Lab 06/15/22 1553 06/16/22 0612 06/17/22 0516  WBC 12.1* 11.7* 8.7  RBC 4.47 4.26 3.91*  HGB 14.7  14.3 12.8*  HCT 44.0 41.2 38.4*  MCV 98.4 96.7 98.2  MCH 32.9 33.6 32.7  MCHC 33.4 34.7 33.3  RDW 13.7 13.9 13.5  PLT 223 205 209   Thyroid  Recent Labs  Lab 06/16/22 0612  TSH 3.113    BNPNo results for input(s): "BNP", "PROBNP" in the last 168 hours.  DDimer No results for input(s): "DDIMER" in the last 168 hours.   Radiology    ECHOCARDIOGRAM COMPLETE  Result Date: 06/16/2022    ECHOCARDIOGRAM REPORT   Patient Name:   Ian Gross Date of Exam: 06/16/2022 Medical Rec #:  [id number]         Height: Accession #:    [id number]        Weight: Date of Birth:  Oct 11, 1975          BSA: Patient Age:    47 years          BP:           135/70 mmHg Patient Gender: M                 HR:           62 bpm. Exam Location:  Inpatient Procedure: 2D Echo, Cardiac Doppler and Color Doppler                                    STAT ECHO              Reported to: Smitty Pluck on 06/16/2022 8:20:00 AM  Surgeon Dr. Lowella Dell notified of newly discovered aortic  valve findings right   away by Rolla Etienne, [place]. Dr. Lowella Dell came to patient bedside and viewed images on the ultrasound machine and CABG surgery is now on hold pending further                    workup for possible AV repair/replacement. Indications:    I25.110 Atherosclerotic heart disease of native coronary artery                 with unstable angina pectoris  History:        Patient has no prior history of Echocardiogram examinations.                 CAD.  Sonographer:    Rolla Etienne Referring Phys: Melodie Bouillon MD IMPRESSIONS  1. Left ventricular ejection fraction, by estimation, is 40%. The left ventricle has mild to moderately decreased function. The left ventricle demonstrates regional wall motion abnormalities with mid to apical anteroseptal and inferoseptal akinesis and anterior hypokinesis. The left ventricular internal cavity size was mildly dilated. Left ventricular diastolic parameters were normal.  2. Right ventricular systolic function is normal. The right ventricular size is normal. Tricuspid regurgitation signal is inadequate for assessing PA pressure.  3. The mitral valve is normal in structure. No evidence of mitral valve regurgitation. No evidence of mitral stenosis.  4. The aortic valve is probaby tricuspid though not visualized ideally. There is mild calcification of the aortic valve. Aortic valve regurgitation is moderate to severe. There was no holodiastolic flow reversal noted in the descending thoracic aorta. No aortic stenosis is present.  5. The inferior vena cava is normal in size with <50% respiratory variability, suggesting right atrial pressure of 8 mmHg. FINDINGS  Left Ventricle: Left ventricular ejection fraction, by estimation, is 40%. The left ventricle has mild to moderately decreased  function. The left ventricle demonstrates regional wall motion abnormalities. The left ventricular internal cavity size was mildly dilated. There is no left ventricular hypertrophy. Left  ventricular diastolic parameters were normal. Right Ventricle: The right ventricular size is normal. No increase in right ventricular wall thickness. Right ventricular systolic function is normal. Tricuspid regurgitation signal is inadequate for assessing PA pressure. Left Atrium: Left atrial size was normal in size. Right Atrium: Right atrial size was normal in size. Pericardium: There is no evidence of pericardial effusion. Mitral Valve: The mitral valve is normal in structure. No evidence of mitral valve regurgitation. No evidence of mitral valve stenosis. Tricuspid Valve: The tricuspid valve is normal in structure. Tricuspid valve regurgitation is not demonstrated. Aortic Valve: The aortic valve is tricuspid. There is mild calcification of the aortic valve. Aortic valve regurgitation is moderate to severe. No aortic stenosis is present. Aortic valve mean gradient measures 8.0 mmHg. Aortic valve peak gradient measures 14.0 mmHg. Aortic valve area, by VTI measures 256.63 cm. Pulmonic Valve: The pulmonic valve was normal in structure. Pulmonic valve regurgitation is not visualized. Aorta: The aortic root is normal in size and structure. Venous: The inferior vena cava is normal in size with less than 50% respiratory variability, suggesting right atrial pressure of 8 mmHg. IAS/Shunts: No atrial level shunt detected by color flow Doppler.  LEFT VENTRICLE PLAX 2D LVIDd:         5.70 cm   Diastology LVIDs:         3.90 cm   LV e' medial:    10.70 cm/s LV PW:         0.80 cm   LV E/e' medial:  7.0 LVOT diam:     2.10 cm   LV e' lateral:   13.10 cm/s LV SV:         9213      LV E/e' lateral: 5.7 LVOT Area:     3.46 cm  RIGHT VENTRICLE RV Basal diam:  2.50 cm RV Mid diam:    2.00 cm RV Length:      7.10 cm RV S prime:     12.90 cm/s TAPSE (M-mode): 2.1 cm LEFT ATRIUM LA diam:      2.50 cm LA Vol (A2C): 51.9 ml  AORTIC VALVE AV Area (Vmax):    2.39 cm AV Area (Vmean):   2.20 cm AV Area (VTI):     256.63 cm AV Vmax:            187.00 cm/s AV Vmean:          127.000 cm/s AV VTI:            0.359 m AV Peak Grad:      14.0 mmHg AV Mean Grad:      8.0 mmHg LVOT Vmax:         129.00 cm/s LVOT Vmean:        80.700 cm/s LVOT VTI:          26.600 m LVOT/AV VTI ratio: 74.09 MITRAL VALVE MV Area (PHT): 3.93 cm    SHUNTS MV Decel Time: 193 msec    Systemic VTI:  26.60 m MV E velocity: 74.40 cm/s  Systemic Diam: 2.10 cm MV A velocity: 62.90 cm/s MV E/A ratio:  1.18 Dalton McleanMD Electronically signed by Franki Monte Signature Date/Time: 06/16/2022/1:37:06 PM    Final    DG Orthopantogram  Result Date: 06/16/2022 CLINICAL DATA:  Poor dentition EXAM: ORTHOPANTOGRAM/PANORAMIC COMPARISON:  None Available. FINDINGS: Motion artifact  related to Panorex technique. Suspected large dental cavity along the mesial border of tooth # 30. Smaller suspected adjacent cavity along the distal margin tooth # 29. Potential chip or lucency along the occlusal margin of tooth # 10. Tooth # 19 is absent. Other subtle irregularities along the occlusal margins of various teeth including tooth # 6 and tooth # 21, correlate with visual inspection. IMPRESSION: 1. Tooth decay related findings as noted above. Electronically Signed   By: Van Clines M.D.   On: 06/16/2022 10:51   CARDIAC CATHETERIZATION  Addendum Date: 06/15/2022   Conclusions: Severe complex ostial/proximal LAD involving moderate-caliber D1 that is difficult to visualize due to vessel overlap.  Lesion appears to be an acute plaque rupture with up to 90% stenosis involving the LAD and 75% stenosis involving D1 leading to the patient's NSTEMI.  LMCA, ramus intermedius, LCx, and RCA are without significant disease. Mildly reduced left ventricular systolic function with mild mid and apical anterior and apical inferior hypokinesis (LVEF 45-50%). Normal left ventricular filling pressure (LVEDP 6 mmHg). Recommendations: Case reviewed with Dr. Ellyn Hack (interventional cardiology) and Dr. Kipp Brood  (cardiothoracic surgery).  Given proximal LAD disease involving sizeable D1 and adjacent ramus intermedius branch, PCI would be high risk.  In the setting of his DM and LAD/D1 disease, we have agreed that CABG may be the best revascularization option.  Given that Mr. Melde is currently chest pain free and hemodynamically stable, we will transfer him to [facility] for urgent evaluation and possible CABG as soon as tomorrow. Start IV heparin 2 hours after TR band deflation. High-intensity statin therapy. Aspirin 81 mg daily; defer P2Y12 inhibitor pending cardiac surgery evaluation. Start metoprolol tartrate 12.5 mg twice daily. Nelva Bush, MD [facility]  Result Date: 06/15/2022 Conclusions: Severe complex ostial/proximal LAD involving moderate-caliber D1 that is difficult to visualize due to vessel overlap.  Lesion appears to be an acute plaque rupture with up to 90% stenosis involving the LAD and 75% stenosis involving D1 leading to the patient's NSTEMI.  LMCA, ramus intermedius, LCx, and RCA are without significant disease. Mildly reduced left ventricular systolic function with mild mid and apical anterior and apical inferior hypokinesis (LVEF 45-50%). Normal left ventricular filling pressure (LVEDP 6 mmHg). Recommendations: Case reviewed with Dr. Ellyn Hack (interventional cardiology) and Dr. Kipp Brood (cardiothoracic surgery).  Given proximal LAD disease involving sizeable D1 and adjacent ramus intermedius branch, PCI would be high risk.  In the setting of his DM and LAD/D1 disease, we have agreed that CABG may be the best revascularization option.  Given that Mr. Fetterolf is currently chest pain free and hemodynamically stable, we will transfer him to [facility] for urgent evaluation and possible CABG as soon as tomorrow. Start IV heparin 2 hours after TR band deflation. High-intensity statin therapy. Aspirin 81 mg daily; defer P2Y12 inhibitor pending cardiac surgery evaluation. Start metoprolol tartrate  12.5 mg twice daily. Nelva Bush, MD [facility]   Cardiac Studies   LHC 06/15/22:   Conclusions: Severe complex ostial/proximal LAD involving moderate-caliber D1 that is difficult to visualize due to vessel overlap.  Lesion appears to be an acute plaque rupture with up to 90% stenosis involving the LAD and 75% stenosis involving D1 leading to the patient's NSTEMI.  LMCA, ramus intermedius, LCx, and RCA are without significant disease. Mildly reduced left ventricular systolic function with mild mid and apical anterior and apical inferior hypokinesis (LVEF 45-50%). Normal left ventricular filling pressure (LVEDP 6 mmHg).   Recommendations: Case reviewed with Dr. Ellyn Hack (  interventional cardiology) and Dr. Kipp Brood (cardiothoracic surgery).  Given proximal LAD disease involving sizeable D1 and adjacent ramus intermedius branch, PCI would be high risk.  In the setting of his DM and LAD/D1 disease, we have agreed that CABG may be the best revascularization option.  Given that Mr. Vale is currently chest pain free and hemodynamically stable, we will transfer him to [facility] for urgent evaluation and possible CABG as soon as tomorrow. Start IV heparin 2 hours after TR band deflation. High-intensity statin therapy. Aspirin 81 mg daily; defer P2Y12 inhibitor pending cardiac surgery evaluation. Start metoprolol tartrate 12.5 mg twice daily.  Echo 06/16/22 1. Left ventricular ejection fraction, by estimation, is 40%. The left  ventricle has mild to moderately decreased function. The left ventricle  demonstrates regional wall motion abnormalities with mid to apical  anteroseptal and inferoseptal akinesis and  anterior hypokinesis. The left ventricular internal cavity size was mildly  dilated. Left ventricular diastolic parameters were normal.   2. Right ventricular systolic function is normal. The right ventricular  size is normal. Tricuspid regurgitation signal is inadequate for assessing   PA pressure.   3. The mitral valve is normal in structure. No evidence of mitral valve  regurgitation. No evidence of mitral stenosis.   4. The aortic valve is probaby tricuspid though not visualized ideally.  There is mild calcification of the aortic valve. Aortic valve  regurgitation is moderate to severe. There was no holodiastolic flow  reversal noted in the descending thoracic aorta.  No aortic stenosis is present.   5. The inferior vena cava is normal in size with <50% respiratory  variability, suggesting right atrial pressure of 8 mmHg.   Patient Profile     47 y.o. 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 Taken to cath lab, found to have severe proximal LAD disease, transferred to [facility] for CABG.  Assessment & Plan    Chest pain Anterior STEMI Severe proximal LAD disease Moderate-severe aortic regurgitation -chest pain free -initially planned for CABG 06/16/22, but echo images with moderate-severe AR. Now planned for AVR with CABG -continue aspirin, statin, heparin, beta blocker -per CT surgery, TEE not required prior to CABG  Hypertension -well controlled to low currently.  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

## 2022-06-17 NOTE — Plan of Care (Signed)
  Problem: Education: Goal: Knowledge of General Education information will improve Description: Including pain rating scale, medication(s)/side effects and non-pharmacologic comfort measures Outcome: Progressing   Problem: Health Behavior/Discharge Planning: Goal: Ability to manage health-related needs will improve Outcome: Progressing   Problem: Clinical Measurements: Goal: Ability to maintain clinical measurements within normal limits will improve Outcome: Progressing Goal: Will remain free from infection Outcome: Progressing Goal: Diagnostic test results will improve Outcome: Progressing Goal: Respiratory complications will improve Outcome: Progressing Goal: Cardiovascular complication will be avoided Outcome: Progressing   Problem: Activity: Goal: Risk for activity intolerance will decrease Outcome: Progressing   Problem: Nutrition: Goal: Adequate nutrition will be maintained Outcome: Progressing   Problem: Coping: Goal: Level of anxiety will decrease Outcome: Progressing   Problem: Elimination: Goal: Will not experience complications related to bowel motility Outcome: Progressing Goal: Will not experience complications related to urinary retention Outcome: Progressing   Problem: Pain Managment: Goal: General experience of comfort will improve Outcome: Progressing   Problem: Safety: Goal: Ability to remain free from injury will improve Outcome: Progressing   Problem: Skin Integrity: Goal: Risk for impaired skin integrity will decrease Outcome: Progressing   Problem: Education: Goal: Understanding of CV disease, CV risk reduction, and recovery process will improve Outcome: Progressing Goal: Individualized Educational Video(s) Outcome: Progressing   Problem: Activity: Goal: Ability to return to baseline activity level will improve Outcome: Progressing   Problem: Cardiovascular: Goal: Ability to achieve and maintain adequate cardiovascular perfusion  will improve Outcome: Progressing Goal: Vascular access site(s) Level 0-1 will be maintained Outcome: Progressing   Problem: Health Behavior/Discharge Planning: Goal: Ability to safely manage health-related needs after discharge will improve Outcome: Progressing   Problem: Education: Goal: Understanding of cardiac disease, CV risk reduction, and recovery process will improve Outcome: Progressing Goal: Individualized Educational Video(s) Outcome: Progressing   Problem: Activity: Goal: Ability to tolerate increased activity will improve Outcome: Progressing   Problem: Cardiac: Goal: Ability to achieve and maintain adequate cardiovascular perfusion will improve Outcome: Progressing   Problem: Health Behavior/Discharge Planning: Goal: Ability to safely manage health-related needs after discharge will improve Outcome: Progressing   Problem: Education: Goal: Ability to describe self-care measures that may prevent or decrease complications (Diabetes Survival Skills Education) will improve Outcome: Progressing Goal: Individualized Educational Video(s) Outcome: Progressing   Problem: Coping: Goal: Ability to adjust to condition or change in health will improve Outcome: Progressing   Problem: Fluid Volume: Goal: Ability to maintain a balanced intake and output will improve Outcome: Progressing   Problem: Health Behavior/Discharge Planning: Goal: Ability to identify and utilize available resources and services will improve Outcome: Progressing Goal: Ability to manage health-related needs will improve Outcome: Progressing

## 2022-06-17 NOTE — Progress Notes (Signed)
ANTICOAGULATION CONSULT NOTE  Pharmacy Consult for heparin Indication: chest pain/ACS  No Known Allergies  Patient Measurements:   Heparin Dosing Weight: 61kg  Vital Signs: Temp: 98 F (36.7 C) (03/21 0736) Temp Source: Oral (03/21 0736) BP: 108/65 (03/21 0736) Pulse Rate: 56 (03/21 0736)  Labs: Recent Labs    06/15/22 1553 06/15/22 1553 06/15/22 1803 06/16/22 0612 06/16/22 1308 06/16/22 2246 06/17/22 0516  HGB 14.7  --   --  14.3  --   --  12.8*  HCT 44.0  --   --  41.2  --   --  38.4*  PLT 223  --   --  205  --   --  209  APTT 27  --   --   --   --   --   --   LABPROT 14.4  --   --   --   --   --   --   INR 1.1  --   --   --   --   --   --   HEPARINUNFRC  --    < >  --  <0.10* 0.19* 0.55 0.57  CREATININE 1.13  --   --  1.03  --   --  1.00  TROPONINIHS 21*  --  101*  --   --   --   --    < > = values in this interval not displayed.    Estimated Creatinine Clearance: 77.5 mL/min (by C-G formula based on SCr of 1 mg/dL).    Assessment: Pt s/p cath which showed complete ostial/proximal LAD disease - PCI would be high risk. CVTS consult for CABG, heparin resumed after TR band removal.   Heparin level therapeutic at 0.57, CBC remains stable.  Goal of Therapy:  Heparin level 0.3-0.7 units/ml Monitor platelets by anticoagulation protocol: Yes   Plan:  -Continue heparin 1100 units/h -Daily heparin level and CBC  Arrie Senate, PharmD, BCPS, [facility] Clinical Pharmacist [phone] Please check AMION for all [facility] Pharmacy numbers 06/17/2022

## 2022-06-17 NOTE — Progress Notes (Signed)
CARDIAC REHAB PHASE I    Pt resting in bed, feeling well today. Pre-op [facility] education provided yesterday. No questions or concerns at this time. Will continue to follow.   [id number] Vanessa Barbara, RN BSN 06/17/2022 10:42 AM

## 2022-06-18 LAB — CBC
HCT: 38.4 % — ABNORMAL LOW (ref 39.0–52.0)
Hemoglobin: 12.8 g/dL — ABNORMAL LOW (ref 13.0–17.0)
MCH: 32.4 pg (ref 26.0–34.0)
MCHC: 33.3 g/dL (ref 30.0–36.0)
MCV: 97.2 fL (ref 80.0–100.0)
Platelets: 200 10*3/uL (ref 150–400)
RBC: 3.95 MIL/uL — ABNORMAL LOW (ref 4.22–5.81)
RDW: 13.2 % (ref 11.5–15.5)
WBC: 13.2 10*3/uL — ABNORMAL HIGH (ref 4.0–10.5)
nRBC: 0 % (ref 0.0–0.2)

## 2022-06-18 LAB — HEPARIN LEVEL (UNFRACTIONATED): Heparin Unfractionated: 0.46 IU/mL (ref 0.30–0.70)

## 2022-06-18 LAB — GLUCOSE, CAPILLARY
Glucose-Capillary: 160 mg/dL — ABNORMAL HIGH (ref 70–99)
Glucose-Capillary: 76 mg/dL (ref 70–99)
Glucose-Capillary: 263 mg/dL — ABNORMAL HIGH (ref 70–99)
Glucose-Capillary: 251 mg/dL — ABNORMAL HIGH (ref 70–99)

## 2022-06-18 MED ORDER — INSULIN ASPART 100 UNIT/ML IJ SOLN
4.0000 [IU] | Freq: Three times a day (TID) | INTRAMUSCULAR | Status: DC
  Administered 2022-06-18 – 2022-06-27 (×15): 4 [IU] via SUBCUTANEOUS

## 2022-06-18 NOTE — Inpatient Diabetes Management (Signed)
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  Please consider: -Increase Novolog meal coverage to 4 units tid if eats 50%  Thank you, Bethena Roys E. Alexius Hangartner, RN, MSN, CDE  Diabetes Coordinator Inpatient Glycemic Control Team Team Pager [phone] (8am-5pm) 06/18/2022 10:43 AM

## 2022-06-18 NOTE — Progress Notes (Signed)
CARDIAC REHAB PHASE I   Stopped in to check on pt. He is feeling frustrated on lack of information regarding timing of surgery. States he wants to leave if nothing can be planned. Informed pt that I will reach out to [facility] for update. Also included RN assigned to him today with this information. Will continue to follow.   1130-1200    Vanessa Barbara, RN BSN 06/18/2022 11:51 AM

## 2022-06-18 NOTE — Progress Notes (Signed)
Rounding Note    Patient Name: Ian Gross Date of Encounter: 06/18/2022  [place] Cardiologist: Ian Bush, MD   Subjective   No acute events, no chest pain or shortness of breath. Awaiting word from dental re: extractions, then can schedule CABG/AVR. Patient is understandably frustrated by the delay.  Inpatient Medications    Scheduled Meds:  aspirin EC  81 mg Oral Daily   atorvastatin  80 mg Oral Daily   buprenorphine-naloxone  1 tablet Sublingual Daily   Chlorhexidine Gluconate Cloth  6 each Topical Daily   insulin aspart  0-6 Units Subcutaneous TID WC   insulin aspart  4 Units Subcutaneous TID WC   insulin glargine-yfgn  6 Units Subcutaneous QHS   lipase/protease/amylase  72,000 Units Oral TID   metoprolol succinate  12.5 mg Oral Daily   Continuous Infusions:  heparin 1,100 Units/hr (06/18/22 0146)   PRN Meds: acetaminophen, nitroGLYCERIN, ondansetron (ZOFRAN) IV, mouth rinse   Vital Signs    Vitals:   06/18/22 0300 06/18/22 0700 06/18/22 1039 06/18/22 1532  BP: 115/69 127/69 (!) 145/71 126/61  Pulse: (!) 57 60 70 (!) 59  Resp: 16 13 16 19   Temp: 98 F (36.7 C) 98.2 F (36.8 C) 98.2 F (36.8 C) 98.6 F (37 C)  TempSrc: Oral Oral Oral Oral  SpO2: 97% 98% 97% 96%    Intake/Output Summary (Last 24 hours) at 06/18/2022 1626 Last data filed at 06/18/2022 1300 Gross per 24 hour  Intake 240 ml  Output --  Net 240 ml       06/15/2022    5:45 PM 06/15/2022    3:55 PM 12/09/2021   10:38 AM  Last 3 Weights  Weight (lbs) 135 lb 2.3 oz 135 lb 129 lb  Weight (kg) 61.3 kg 61.236 kg 58.514 kg      Telemetry    SR/sinus bradycardia - Personally Reviewed  ECG    Sinus bradycardia at 59 bpm. Anteroseptal borderline ST elevation with  inferolateral t wave inversions- Personally Reviewed  Physical Exam   GEN: Well nourished, well developed in no acute distress NECK: No JVD CARDIAC: regular rhythm, normal S1 and S2, no rubs or  gallops. No murmur. VASCULAR: Radial pulses 2+ bilaterally.  RESPIRATORY:  Clear to auscultation without rales, wheezing or rhonchi  ABDOMEN: Soft, non-tender, non-distended MUSCULOSKELETAL:  Moves all 4 limbs independently SKIN: Warm and dry, no edema NEUROLOGIC:  No focal neuro deficits noted. PSYCHIATRIC:  Normal affect    Labs    High Sensitivity Troponin:   Recent Labs  Lab 06/15/22 1553 06/15/22 1803  TROPONINIHS 21* 101*     Chemistry Recent Labs  Lab 06/15/22 1553 06/16/22 0612 06/17/22 0516  NA 140 140 135  K 4.0 4.2 4.2  CL 102 105 103  CO2 28 24 27   GLUCOSE 147* 87 166*  BUN 16 14 13   CREATININE 1.13 1.03 1.00  CALCIUM 8.8* 8.5* 8.1*  PROT 6.0*  --   --   ALBUMIN 3.4*  --   --   AST 48*  --   --   ALT 32  --   --   ALKPHOS 147*  --   --   BILITOT 0.6  --   --   GFRNONAA >60 >60 >60  ANIONGAP 10 11 5     Lipids  Recent Labs  Lab 06/15/22 1553  CHOL 98  TRIG 58  HDL 48  LDLCALC 38  CHOLHDL 2.0    Hematology Recent Labs  Lab  06/16/22 0612 06/17/22 0516 06/18/22 0008  WBC 11.7* 8.7 13.2*  RBC 4.26 3.91* 3.95*  HGB 14.3 12.8* 12.8*  HCT 41.2 38.4* 38.4*  MCV 96.7 98.2 97.2  MCH 33.6 32.7 32.4  MCHC 34.7 33.3 33.3  RDW 13.9 13.5 13.2  PLT 205 209 200   Thyroid  Recent Labs  Lab 06/16/22 0612  TSH 3.113    BNPNo results for input(s): "BNP", "PROBNP" in the last 168 hours.  DDimer No results for input(s): "DDIMER" in the last 168 hours.   Radiology    No results found.  Cardiac Studies   LHC 06/15/22:   Conclusions: Severe complex ostial/proximal LAD involving moderate-caliber D1 that is difficult to visualize due to vessel overlap.  Lesion appears to be an acute plaque rupture with up to 90% stenosis involving the LAD and 75% stenosis involving D1 leading to the patient's NSTEMI.  LMCA, ramus intermedius, LCx, and RCA are without significant disease. Mildly reduced left ventricular systolic function with mild mid and apical  anterior and apical inferior hypokinesis (LVEF 45-50%). Normal left ventricular filling pressure (LVEDP 6 mmHg).   Recommendations: Case reviewed with Dr. Ellyn Gross (interventional cardiology) and Dr. Kipp Gross (cardiothoracic surgery).  Given proximal LAD disease involving sizeable D1 and adjacent ramus intermedius branch, PCI would be high risk.  In the setting of his DM and LAD/D1 disease, we have agreed that CABG may be the best revascularization option.  Given that Mr. Ian Gross is currently chest pain free and hemodynamically stable, we will transfer him to [facility] for urgent evaluation and possible CABG as soon as tomorrow. Start IV heparin 2 hours after TR band deflation. High-intensity statin therapy. Aspirin 81 mg daily; defer P2Y12 inhibitor pending cardiac surgery evaluation. Start metoprolol tartrate 12.5 mg twice daily.  Echo 06/16/22 1. Left ventricular ejection fraction, by estimation, is 40%. The left  ventricle has mild to moderately decreased function. The left ventricle  demonstrates regional wall motion abnormalities with mid to apical  anteroseptal and inferoseptal akinesis and  anterior hypokinesis. The left ventricular internal cavity size was mildly  dilated. Left ventricular diastolic parameters were normal.   2. Right ventricular systolic function is normal. The right ventricular  size is normal. Tricuspid regurgitation signal is inadequate for assessing  PA pressure.   3. The mitral valve is normal in structure. No evidence of mitral valve  regurgitation. No evidence of mitral stenosis.   4. The aortic valve is probaby tricuspid though not visualized ideally.  There is mild calcification of the aortic valve. Aortic valve  regurgitation is moderate to severe. There was no holodiastolic flow  reversal noted in the descending thoracic aorta.  No aortic stenosis is present.   5. The inferior vena cava is normal in size with <50% respiratory  variability, suggesting  right atrial pressure of 8 mmHg.   Patient Profile     47 y.o. 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 Taken to cath lab, found to have severe proximal LAD disease, transferred to [facility] for CABG.  Assessment & Plan    Chest pain Anterior STEMI Severe proximal LAD disease Moderate-severe aortic regurgitation -chest pain free -initially planned for CABG 06/16/22, but echo images with moderate-severe AR. Now planned for AVR with CABG -pending dental evaluation and likely extraction prior to surgery -continue aspirin, statin, heparin, beta blocker -per  CT surgery, TEE not required prior to CABG  Hypertension -well controlled to low currently.  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

## 2022-06-18 NOTE — Progress Notes (Signed)
Patient states he is leaving today if no procedures are occurring--whether it is by doctor discharging him or leaving AMA---I have encouraged him that leaving today without the appropriate anticoagulation could be detrimental to his health, he is currently on heparin gtt--according to Dr Judeth Cornfield note, cardiology is waiting on dentist/oral surgeon findings due to poor dental hygiene so that any further heart procedures will occur as safely as possible--patient is extremely anxious about exactly what time oral surgeon will be coming, family in room is very demanding on exactly when he is coming and is constantly requesting that I page other doctors to get more information--I have talked with Romie Jumper x2, Patrick Jupiter has been to room to talk with patient, and I have reminded patient that we do not have an exact time for oral surgeon today, his name is Dr Hershal Coria and he is supposed to be coming sometime today (with that being the only information I have)

## 2022-06-18 NOTE — Progress Notes (Signed)
ANTICOAGULATION CONSULT NOTE  Pharmacy Consult for heparin Indication: chest pain/ACS  No Known Allergies  Patient Measurements:   Heparin Dosing Weight: 61kg  Vital Signs: Temp: 98.2 F (36.8 C) (03/22 0700) Temp Source: Oral (03/22 0700) BP: 127/69 (03/22 0700) Pulse Rate: 60 (03/22 0700)  Labs: Recent Labs    06/15/22 1553 06/15/22 1803 06/16/22 0612 06/16/22 1308 06/16/22 2246 06/17/22 0516 06/18/22 0008  HGB 14.7  --  14.3  --   --  12.8* 12.8*  HCT 44.0  --  41.2  --   --  38.4* 38.4*  PLT 223  --  205  --   --  209 200  APTT 27  --   --   --   --   --   --   LABPROT 14.4  --   --   --   --   --   --   INR 1.1  --   --   --   --   --   --   HEPARINUNFRC  --   --  <0.10*   < > 0.55 0.57 0.46  CREATININE 1.13  --  1.03  --   --  1.00  --   TROPONINIHS 21* 101*  --   --   --   --   --    < > = values in this interval not displayed.     Estimated Creatinine Clearance: 77.5 mL/min (by C-G formula based on SCr of 1 mg/dL).    Assessment: Pt s/p cath which showed complete ostial/proximal LAD disease - PCI would be high risk. CVTS consult for CABG, heparin resumed after TR band removal.  Also with moderate AI + poor dentition, awaiting further workup.   Heparin level therapeutic at 0.46, CBC remains stable.  Goal of Therapy:  Heparin level 0.3-0.7 units/ml Monitor platelets by anticoagulation protocol: Yes   Plan:  -Continue heparin 1100 units/h -Daily heparin level and CBC -F/u plans for surgery eventually.  [place] Crane, Roylene Reason, BCCP Clinical Pharmacist  06/18/2022 8:26 AM   [facility] pharmacy phone numbers are listed on [email or web address]

## 2022-06-19 LAB — CBC
HCT: 40 % (ref 39.0–52.0)
Hemoglobin: 13.7 g/dL (ref 13.0–17.0)
MCH: 33 pg (ref 26.0–34.0)
MCHC: 34.3 g/dL (ref 30.0–36.0)
MCV: 96.4 fL (ref 80.0–100.0)
Platelets: 195 10*3/uL (ref 150–400)
RBC: 4.15 MIL/uL — ABNORMAL LOW (ref 4.22–5.81)
RDW: 13.2 % (ref 11.5–15.5)
WBC: 10.1 10*3/uL (ref 4.0–10.5)
nRBC: 0 % (ref 0.0–0.2)

## 2022-06-19 LAB — GLUCOSE, CAPILLARY
Glucose-Capillary: 135 mg/dL — ABNORMAL HIGH (ref 70–99)
Glucose-Capillary: 309 mg/dL — ABNORMAL HIGH (ref 70–99)
Glucose-Capillary: 247 mg/dL — ABNORMAL HIGH (ref 70–99)
Glucose-Capillary: 127 mg/dL — ABNORMAL HIGH (ref 70–99)
Glucose-Capillary: 269 mg/dL — ABNORMAL HIGH (ref 70–99)

## 2022-06-19 LAB — HEPARIN LEVEL (UNFRACTIONATED): Heparin Unfractionated: 0.4 IU/mL (ref 0.30–0.70)

## 2022-06-19 MED ORDER — NICOTINE 14 MG/24HR TD PT24
14.0000 mg | MEDICATED_PATCH | Freq: Every day | TRANSDERMAL | Status: DC
  Administered 2022-06-20 – 2022-07-05 (×15): 14 mg via TRANSDERMAL
  Filled 2022-06-19 (×15): qty 1

## 2022-06-19 NOTE — Progress Notes (Signed)
Cardiac Rehab Phase 1 [phone] Attempted to ambulate patient. Patient declines ambulation at this time, stating he would like to sleep right now. Encouraged patient to walk later with nursing staff as ready, and patient is amenable to this.  Sol Passer, MS, ACSM CEP

## 2022-06-19 NOTE — Progress Notes (Signed)
ANTICOAGULATION CONSULT NOTE  Pharmacy Consult for heparin Indication: chest pain/ACS  No Known Allergies  Patient Measurements:   Heparin Dosing Weight: 61kg  Vital Signs: Temp: 98.2 F (36.8 C) (03/23 0716) Temp Source: Oral (03/23 0716) BP: 123/61 (03/23 0716) Pulse Rate: 65 (03/23 0716)  Labs: Recent Labs    06/17/22 0516 06/18/22 0008 06/19/22 0005  HGB 12.8* 12.8* 13.7  HCT 38.4* 38.4* 40.0  PLT 209 200 195  HEPARINUNFRC 0.57 0.46 0.40  CREATININE 1.00  --   --      Estimated Creatinine Clearance: 77.5 mL/min (by C-G formula based on SCr of 1 mg/dL).    Assessment: Pt s/p cath which showed complete ostial/proximal LAD disease - PCI would be high risk. CVTS consult for CABG, heparin resumed after TR band removal.  Also with moderate AI + poor dentition, awaiting further workup.   Heparin level therapeutic at 0.4, CBC remains stable.  Goal of Therapy:  Heparin level 0.3-0.7 units/ml Monitor platelets by anticoagulation protocol: Yes   Plan:  -Continue heparin 1100 units/h -Daily heparin level and CBC -F/u plans for surgery eventually.  [place] Crane, Roylene Reason, BCCP Clinical Pharmacist  06/19/2022 7:26 AM   [facility] pharmacy phone numbers are listed on [email or web address]

## 2022-06-20 ENCOUNTER — Inpatient Hospital Stay (HOSPITAL_COMMUNITY): Payer: Medicare Other

## 2022-06-20 DIAGNOSIS — Z01810 Encounter for preprocedural cardiovascular examination: Secondary | ICD-10-CM | POA: Diagnosis not present

## 2022-06-20 LAB — TYPE AND SCREEN
ABO/RH(D): A POS
Antibody Screen: NEGATIVE
Unit division: 0
Unit division: 0

## 2022-06-20 LAB — BASIC METABOLIC PANEL
Anion gap: 6 (ref 5–15)
BUN: 17 mg/dL (ref 6–20)
CO2: 27 mmol/L (ref 22–32)
Calcium: 8.3 mg/dL — ABNORMAL LOW (ref 8.9–10.3)
Chloride: 99 mmol/L (ref 98–111)
Creatinine, Ser: 0.99 mg/dL (ref 0.61–1.24)
GFR, Estimated: >60 mL/min (ref >60)
Glucose, Bld: 281 mg/dL — ABNORMAL HIGH (ref 70–99)
Potassium: 4.5 mmol/L (ref 3.5–5.1)
Sodium: 132 mmol/L — ABNORMAL LOW (ref 135–145)

## 2022-06-20 LAB — BPAM RBC
Blood Product Expiration Date: 202404102359
Blood Product Expiration Date: 202404102359
Unit Type and Rh: 6200
Unit Type and Rh: 6200

## 2022-06-20 LAB — CBC
HCT: 37.6 % — ABNORMAL LOW (ref 39.0–52.0)
Hemoglobin: 13.2 g/dL (ref 13.0–17.0)
MCH: 33.3 pg (ref 26.0–34.0)
MCHC: 35.1 g/dL (ref 30.0–36.0)
MCV: 94.9 fL (ref 80.0–100.0)
Platelets: 175 10*3/uL (ref 150–400)
RBC: 3.96 MIL/uL — ABNORMAL LOW (ref 4.22–5.81)
RDW: 12.9 % (ref 11.5–15.5)
WBC: 10.9 10*3/uL — ABNORMAL HIGH (ref 4.0–10.5)
nRBC: 0 % (ref 0.0–0.2)

## 2022-06-20 LAB — GLUCOSE, CAPILLARY
Glucose-Capillary: 178 mg/dL — ABNORMAL HIGH (ref 70–99)
Glucose-Capillary: 132 mg/dL — ABNORMAL HIGH (ref 70–99)
Glucose-Capillary: 69 mg/dL — ABNORMAL LOW (ref 70–99)
Glucose-Capillary: 146 mg/dL — ABNORMAL HIGH (ref 70–99)
Glucose-Capillary: 288 mg/dL — ABNORMAL HIGH (ref 70–99)

## 2022-06-20 LAB — HEPARIN LEVEL (UNFRACTIONATED): Heparin Unfractionated: 0.33 IU/mL (ref 0.30–0.70)

## 2022-06-20 NOTE — Plan of Care (Signed)
  Problem: Education: Goal: Knowledge of General Education information will improve Description: Including pain rating scale, medication(s)/side effects and non-pharmacologic comfort measures Outcome: Progressing   Problem: Health Behavior/Discharge Planning: Goal: Ability to manage health-related needs will improve Outcome: Progressing   Problem: Clinical Measurements: Goal: Ability to maintain clinical measurements within normal limits will improve Outcome: Progressing Goal: Will remain free from infection Outcome: Progressing Goal: Diagnostic test results will improve Outcome: Progressing Goal: Respiratory complications will improve Outcome: Progressing Goal: Cardiovascular complication will be avoided Outcome: Progressing   Problem: Activity: Goal: Risk for activity intolerance will decrease Outcome: Progressing   Problem: Nutrition: Goal: Adequate nutrition will be maintained Outcome: Progressing   Problem: Coping: Goal: Level of anxiety will decrease Outcome: Progressing   Problem: Elimination: Goal: Will not experience complications related to bowel motility Outcome: Progressing Goal: Will not experience complications related to urinary retention Outcome: Progressing   Problem: Pain Managment: Goal: General experience of comfort will improve Outcome: Progressing   Problem: Safety: Goal: Ability to remain free from injury will improve Outcome: Progressing   Problem: Skin Integrity: Goal: Risk for impaired skin integrity will decrease Outcome: Progressing   Problem: Education: Goal: Understanding of CV disease, CV risk reduction, and recovery process will improve Outcome: Progressing Goal: Individualized Educational Video(s) Outcome: Progressing   Problem: Activity: Goal: Ability to return to baseline activity level will improve Outcome: Progressing   Problem: Cardiovascular: Goal: Ability to achieve and maintain adequate cardiovascular perfusion  will improve Outcome: Progressing Goal: Vascular access site(s) Level 0-1 will be maintained Outcome: Progressing   Problem: Health Behavior/Discharge Planning: Goal: Ability to safely manage health-related needs after discharge will improve Outcome: Progressing   Problem: Education: Goal: Understanding of cardiac disease, CV risk reduction, and recovery process will improve Outcome: Progressing Goal: Individualized Educational Video(s) Outcome: Progressing   Problem: Activity: Goal: Ability to tolerate increased activity will improve Outcome: Progressing   Problem: Cardiac: Goal: Ability to achieve and maintain adequate cardiovascular perfusion will improve Outcome: Progressing   Problem: Health Behavior/Discharge Planning: Goal: Ability to safely manage health-related needs after discharge will improve Outcome: Progressing   Problem: Education: Goal: Ability to describe self-care measures that may prevent or decrease complications (Diabetes Survival Skills Education) will improve Outcome: Progressing Goal: Individualized Educational Video(s) Outcome: Progressing   Problem: Coping: Goal: Ability to adjust to condition or change in health will improve Outcome: Progressing   Problem: Fluid Volume: Goal: Ability to maintain a balanced intake and output will improve Outcome: Progressing   Problem: Health Behavior/Discharge Planning: Goal: Ability to identify and utilize available resources and services will improve Outcome: Progressing Goal: Ability to manage health-related needs will improve Outcome: Progressing   Problem: Metabolic: Goal: Ability to maintain appropriate glucose levels will improve Outcome: Progressing   Problem: Nutritional: Goal: Maintenance of adequate nutrition will improve Outcome: Progressing Goal: Progress toward achieving an optimal weight will improve Outcome: Progressing   Problem: Skin Integrity: Goal: Risk for impaired skin  integrity will decrease Outcome: Progressing   Problem: Tissue Perfusion: Goal: Adequacy of tissue perfusion will improve Outcome: Progressing   

## 2022-06-20 NOTE — Progress Notes (Signed)
   Patient Name: Ian Gross Date of Encounter: 06/20/2022 [place] Cardiologist: Nelva Bush, MD   Interval Summary   No pain. No SOB.   Vital Signs   Vitals:   06/19/22 1920 06/19/22 2319 06/20/22 0322 06/20/22 0704  BP: 99/63 97/60 104/68 112/61  Pulse: 65 (!) 54 (!) 57 (!) 54  Resp: 19 20 13 13   Temp: 98.3 F (36.8 C) 98.5 F (36.9 C) 98.1 F (36.7 C) 98.4 F (36.9 C)  TempSrc: Oral Oral Oral Oral  SpO2: 95% 96% 97% 99%  Weight:      Height:        Intake/Output Summary (Last 24 hours) at 06/20/2022 [id number] Last data filed at 06/19/2022 2320 Gross per 24 hour  Intake 605.89 ml  Output --  Net 605.89 ml      06/19/2022   11:04 AM 06/15/2022    5:45 PM 06/15/2022    3:55 PM  Last 3 Weights  Weight (lbs) 135 lb 2.3 oz 135 lb 2.3 oz 135 lb  Weight (kg) 61.3 kg 61.3 kg 61.236 kg      Telemetry/ECG    NA - Personally Reviewed  Physical Exam   GEN: No  acute distress.   Neck: No  JVD Cardiac: RRR, soft diastolic murmur, no systolic murmurs, rubs, or gallops.  Respiratory: Clear   to auscultation bilaterally. GI: Soft, nontender, non-distended, normal bowel sounds  MS:  No edema; No deformity.   Assessment & Plan    Chest pain:  Anterior STEMI 06/15/22.     Continue current therapy.   Awaiting CABG.     CM:  EF is 40 %.  Titrate meds as tolerated post op.  BP low  AI:   Plan is for AVR CABG.  Pending dental evaluation.  Apparently oral surgery is aware of the consult.    Dr. Raliegh Scarlet name is in the chart.  We were able to contact him and he is not on call but has kindly agreed to look at his schedule to see if/when he can accommodate this patient. We will leave the patient NPO after MN in anticipation of possible oral surgery evaluation tomorrow.     IDDM:  Status post pancreatectomy.  Continue current meds.    Chronic pain:  Continue suboxone.    Risk reduction:    Continue Lipitor.     For questions or updates, please contact [place] Please consult [email or web address] for contact info under        Signed, Minus Breeding, MD

## 2022-06-20 NOTE — Progress Notes (Signed)
[place] for heparin Indication: chest pain/ACS  No Known Allergies  Patient Measurements: Height: 6' (182.9 cm) Weight: 61.3 kg (135 lb 2.3 oz) IBW/kg (Calculated) : 77.6 Heparin Dosing Weight: 61kg  Vital Signs: Temp: 98.4 F (36.9 C) (03/24 0704) Temp Source: Oral (03/24 0704) BP: 112/61 (03/24 0704) Pulse Rate: 54 (03/24 0704)  Labs: Recent Labs    06/18/22 0008 06/19/22 0005 06/20/22 0021  HGB 12.8* 13.7 13.2  HCT 38.4* 40.0 37.6*  PLT 200 195 175  HEPARINUNFRC 0.46 0.40 0.33  CREATININE  --   --  0.99     Estimated Creatinine Clearance: 78.3 mL/min (by C-G formula based on SCr of 0.99 mg/dL).    Assessment: Pt s/p cath which showed complete ostial/proximal LAD disease - PCI would be high risk. CVTS consult for CABG, heparin resumed after TR band removal.  Also with moderate AI + poor dentition, awaiting further workup from oral surgery.   Heparin level therapeutic at 0.33, CBC remains stable.  Goal of Therapy:  Heparin level 0.3-0.7 units/ml Monitor platelets by anticoagulation protocol: Yes   Plan:  -Continue heparin 1100 units/h -Daily heparin level and CBC -F/u plans for surgery eventually.  [place] Crane, Roylene Reason, BCCP Clinical Pharmacist  06/20/2022 7:45 AM   [facility] pharmacy phone numbers are listed on [email or web address]

## 2022-06-20 NOTE — Progress Notes (Signed)
Pre-CABG vascular studies completed.   Please see CV Procedures for preliminary results.  Daesha Insco, RVT  10:21 AM 06/20/22

## 2022-06-21 LAB — CBC
HCT: 39.1 % (ref 39.0–52.0)
Hemoglobin: 13.3 g/dL (ref 13.0–17.0)
MCH: 33.1 pg (ref 26.0–34.0)
MCHC: 34 g/dL (ref 30.0–36.0)
MCV: 97.3 fL (ref 80.0–100.0)
Platelets: 170 10*3/uL (ref 150–400)
RBC: 4.02 MIL/uL — ABNORMAL LOW (ref 4.22–5.81)
RDW: 12.9 % (ref 11.5–15.5)
WBC: 11.1 10*3/uL — ABNORMAL HIGH (ref 4.0–10.5)
nRBC: 0 % (ref 0.0–0.2)

## 2022-06-21 LAB — GLUCOSE, CAPILLARY
Glucose-Capillary: 194 mg/dL — ABNORMAL HIGH (ref 70–99)
Glucose-Capillary: 186 mg/dL — ABNORMAL HIGH (ref 70–99)
Glucose-Capillary: 398 mg/dL — ABNORMAL HIGH (ref 70–99)
Glucose-Capillary: 408 mg/dL — ABNORMAL HIGH (ref 70–99)
Glucose-Capillary: 115 mg/dL — ABNORMAL HIGH (ref 70–99)

## 2022-06-21 LAB — HEPARIN LEVEL (UNFRACTIONATED): Heparin Unfractionated: 0.39 IU/mL (ref 0.30–0.70)

## 2022-06-21 LAB — VAS US DOPPLER PRE CABG

## 2022-06-21 MED ORDER — INSULIN ASPART 100 UNIT/ML IJ SOLN
0.0000 [IU] | Freq: Three times a day (TID) | INTRAMUSCULAR | Status: DC
  Administered 2022-06-21: 3 [IU] via SUBCUTANEOUS
  Administered 2022-06-21: 15 [IU] via SUBCUTANEOUS
  Administered 2022-06-22: 8 [IU] via SUBCUTANEOUS
  Administered 2022-06-22: 3 [IU] via SUBCUTANEOUS
  Administered 2022-06-23: 2 [IU] via SUBCUTANEOUS
  Administered 2022-06-24 (×3): 3 [IU] via SUBCUTANEOUS
  Administered 2022-06-25: 5 [IU] via SUBCUTANEOUS
  Administered 2022-06-25: 2 [IU] via SUBCUTANEOUS
  Administered 2022-06-26: 11 [IU] via SUBCUTANEOUS
  Administered 2022-06-26: 8 [IU] via SUBCUTANEOUS
  Administered 2022-06-27: 3 [IU] via SUBCUTANEOUS
  Administered 2022-06-27: 5 [IU] via SUBCUTANEOUS
  Filled 2022-06-21: qty 1

## 2022-06-21 MED ORDER — INSULIN ASPART 100 UNIT/ML IJ SOLN
0.0000 [IU] | Freq: Every day | INTRAMUSCULAR | Status: DC
  Administered 2022-06-24: 5 [IU] via SUBCUTANEOUS
  Administered 2022-06-26: 2 [IU] via SUBCUTANEOUS
  Administered 2022-06-27: 3 [IU] via SUBCUTANEOUS

## 2022-06-21 NOTE — Care Management Important Message (Signed)
Important Message  Patient Details  Name: Ian Gross MRN: [id number] Date of Birth: [birth year]   Medicare Important Message Given:  Yes     Shalea Tomczak Mont[address] Circle 06/21/2022, 3:17 PM

## 2022-06-21 NOTE — Progress Notes (Signed)
   Patient Name: Ian Gross Date of Encounter: 06/21/2022 [place] Cardiologist: Nelva Bush, MD   Interval Summary   No chest pain.  No SOB.   Vital Signs   Vitals:   06/20/22 2000 06/20/22 2357 06/21/22 0444 06/21/22 0739  BP:  98/61 110/67 117/65  Pulse:  (!) 58 62 79  Resp:  15 20 20   Temp:  98.2 F (36.8 C) 97.9 F (36.6 C) 97.9 F (36.6 C)  TempSrc:  Oral Oral Oral  SpO2: 98% 97% 100% 96%  Weight:      Height:        Intake/Output Summary (Last 24 hours) at 06/21/2022 0907 Last data filed at 06/21/2022 0000 Gross per 24 hour  Intake 591.76 ml  Output --  Net 591.76 ml      06/19/2022   11:04 AM 06/15/2022    5:45 PM 06/15/2022    3:55 PM  Last 3 Weights  Weight (lbs) 135 lb 2.3 oz 135 lb 2.3 oz 135 lb  Weight (kg) 61.3 kg 61.3 kg 61.236 kg      Telemetry/ECG    SB - Personally Reviewed  Physical Exam   GEN: No  acute distress.   Neck: No  JVD Cardiac: RRR, no murmurs, rubs, or gallops.  Respiratory: Clear   to auscultation bilaterally. GI: Soft, nontender, non-distended, normal bowel sounds  MS:  No edema; No deformity.   Assessment & Plan    Chest pain:  Anterior STEMI 06/15/22.     Awaiting CABG.    Very high grade proximal LAD stenosis.  Continue heparin and other meds as listed. Pre CABG vascular studies done.  See below.   CM:  EF is 40 %.  Titrate meds as tolerated post op.  BP low and will not allow med titration currently.   AI:   Plan is for AVR CABG.  Pending dental evaluation.   Please see previous notes.  We will try again to expedite this evaluation.     Tooth decay noted on orthopantogram.   IDDM:  Status post pancreatectomy.   Continue current meds.   Chronic pain:  Continue .    Risk reduction:    Continue Lipitor.    Hyponatremia:    Free water restrict.  Follow in AM with repeat BMET.   Abnormal ABIs:  Moderate left lower extremity arterial disease. He has no claudication.  No change in therapy.   Continued risk reduction     For questions or updates, please contact [place] Please consult [email or web address] for contact info under        Signed, Minus Breeding, MD

## 2022-06-21 NOTE — Progress Notes (Signed)
[place] for heparin Indication: chest pain/ACS  No Known Allergies  Patient Measurements: Height: 6' (182.9 cm) Weight: 61.3 kg (135 lb 2.3 oz) IBW/kg (Calculated) : 77.6 Heparin Dosing Weight: 61kg  Vital Signs: Temp: 97.9 F (36.6 C) (03/25 0739) Temp Source: Oral (03/25 0739) BP: 117/65 (03/25 0739) Pulse Rate: 79 (03/25 0739)  Labs: Recent Labs    06/19/22 0005 06/20/22 0021 06/21/22 0019  HGB 13.7 13.2 13.3  HCT 40.0 37.6* 39.1  PLT 195 175 170  HEPARINUNFRC 0.40 0.33 0.39  CREATININE  --  0.99  --      Estimated Creatinine Clearance: 78.3 mL/min (by C-G formula based on SCr of 0.99 mg/dL).    Assessment: Pt s/p cath which showed complete ostial/proximal LAD disease - PCI would be high risk. CVTS consult for AVR CABG, heparin resumed after TR band removal.  Also with moderate AI + poor dentition, awaiting further workup from oral surgery.   Heparin level therapeutic at 0.39 Current heparin infusion rate: 1100 units/hr  Hgb 13.3 and Plt 170 remain stable No s/sx of bleeding per RN report  Goal of Therapy:  Heparin level 0.3-0.7 units/ml Monitor platelets by anticoagulation protocol: Yes   Plan:  -Continue heparin infusion at 1100 units/h -Daily heparin level and CBC -F/u plans for AVR CABG, pending oral surgery [place], PharmD, BCPS Clinical Pharmacist 06/21/2022 9:25 AM   Please refer to AMION for pharmacy phone number

## 2022-06-21 NOTE — Consult Note (Signed)
Reason for Consult:Pre-op for cardiac surgery Referring Physician: Minus Breeding, MD  Ian Gross is an 47 y.o. male.  47 yo male admitted 06/15/2022 with STEMI. ECHO shows AI. Planning for possible AVR/CABG.   Past Medical History:  Diagnosis Date   Diabetes mellitus with complication [facility]     Past Surgical History:  Procedure Laterality Date   LEFT HEART CATH AND CORONARY ANGIOGRAPHY N/A 06/15/2022   Procedure: LEFT HEART CATH AND CORONARY ANGIOGRAPHY;  Surgeon: Nelva Bush, MD;  Location: [facility];  Service: Cardiovascular;  Laterality: N/A;   PANCREATECTOMY N/A    SPLENECTOMY      Family History  Problem Relation Age of Onset   Lung cancer Father    Healthy Sister    Healthy Brother    CVA Maternal Grandmother    Liver cancer Maternal Grandfather    Alcohol abuse Maternal Grandfather     Social History:  reports that he has been smoking cigarettes. He has been smoking an average of .75 packs per day. He has never used smokeless tobacco. He reports that he does not currently use alcohol. He reports that he does not currently use drugs.  Allergies: No Known Allergies  Medications: I have reviewed the patient's current medications. Prior to Admission:  Medications Prior to Admission  Medication Sig Dispense Refill Last Dose   Buprenorphine HCl-Naloxone HCl 8-2 MG FILM Place 1 Film under the tongue 2 (two) times daily.   06/15/2022   HUMALOG KWIKPEN 100 UNIT/ML KwikPen Inject 4-9 Units into the skin 3 (three) times daily. Sliding scale (go up by 1 unit)   06/15/2022   insulin glargine (LANTUS) 100 UNIT/ML injection Inject 10 Units into the skin at bedtime.   Past Week   Pancrelipase, Lip-Prot-Amyl, (CREON) 24000-76000 units CPEP Take 3 capsules (72,000 Units total) by mouth 3 (three) times daily. 200 capsule 1 06/15/2022   Scheduled:  aspirin EC  81 mg Oral Daily   atorvastatin  80 mg Oral Daily   buprenorphine-naloxone  1 tablet Sublingual Daily    Chlorhexidine Gluconate Cloth  6 each Topical Daily   insulin aspart  0-15 Units Subcutaneous TID WC   insulin aspart  0-5 Units Subcutaneous QHS   insulin aspart  4 Units Subcutaneous TID WC   insulin glargine-yfgn  6 Units Subcutaneous QHS   lipase/protease/amylase  72,000 Units Oral TID   metoprolol succinate  12.5 mg Oral Daily   nicotine  14 mg Transdermal Daily   Continuous:  heparin 1,100 Units/hr (06/20/22 2018)   [id number], nitroGLYCERIN, ondansetron (ZOFRAN) IV, mouth rinse  Results for orders placed or performed during the hospital encounter of 06/15/22 (from the past 48 hour(s))  Glucose, capillary     Status: Abnormal   Collection Time: 06/19/22  3:40 PM  Result Value Ref Range   Glucose-Capillary 247 (H) 70 - 99 mg/dL    Comment: Glucose reference range applies only to samples taken after fasting for at least 8 hours.  Glucose, capillary     Status: Abnormal   Collection Time: 06/19/22  6:06 PM  Result Value Ref Range   Glucose-Capillary 127 (H) 70 - 99 mg/dL    Comment: Glucose reference range applies only to samples taken after fasting for at least 8 hours.  Glucose, capillary     Status: Abnormal   Collection Time: 06/19/22  9:09 PM  Result Value Ref Range   Glucose-Capillary 269 (H) 70 - 99 mg/dL    Comment: Glucose reference range applies only to  samples taken after fasting for at least 8 hours.  Heparin level (unfractionated)     Status: None   Collection Time: 06/20/22 12:21 AM  Result Value Ref Range   Heparin Unfractionated 0.33 0.30 - 0.70 IU/mL    Comment: (NOTE) The clinical reportable range upper limit is being lowered to >1.10 to align with the FDA approved guidance for the current laboratory assay.  If heparin results are below expected values, and patient dosage has  been confirmed, suggest follow up testing of antithrombin III levels. Performed at [facility], [place] [address], [place], [place] [address]   CBC     Status:  Abnormal   Collection Time: 06/20/22 12:21 AM  Result Value Ref Range   WBC 10.9 (H) 4.0 - 10.5 K/uL   RBC 3.96 (L) 4.22 - 5.81 MIL/uL   Hemoglobin 13.2 13.0 - 17.0 g/dL   HCT 37.6 (L) 39.0 - 52.0 %   MCV 94.9 80.0 - 100.0 fL   MCH 33.3 26.0 - 34.0 pg   MCHC 35.1 30.0 - 36.0 g/dL   RDW 12.9 11.5 - 15.5 %   Platelets 175 150 - 400 K/uL   nRBC 0.0 0.0 - 0.2 %    Comment: Performed at [facility], [place] [address] [place], [place] [id number]  Basic metabolic panel     Status: Abnormal   Collection Time: 06/20/22 12:21 AM  Result Value Ref Range   Sodium 132 (L) 135 - 145 mmol/L   Potassium 4.5 3.5 - 5.1 mmol/L   Chloride 99 98 - 111 mmol/L   CO2 27 22 - 32 mmol/L   Glucose, Bld 281 (H) 70 - 99 mg/dL    Comment: Glucose reference range applies only to samples taken after fasting for at least 8 hours.   BUN 17 6 - 20 mg/dL   Creatinine, Ser 0.99 0.61 - 1.24 mg/dL   Calcium 8.3 (L) 8.9 - 10.3 mg/dL   GFR, Estimated >60 >60 mL/min    Comment: (NOTE) Calculated using the CKD-EPI Creatinine Equation (2021)    Anion gap 6 5 - 15    Comment: Performed at [place] [address], [place], [place] [address]  Glucose, capillary     Status: Abnormal   Collection Time: 06/20/22  6:23 AM  Result Value Ref Range   Glucose-Capillary 178 (H) 70 - 99 mg/dL    Comment: Glucose reference range applies only to samples taken after fasting for at least 8 hours.  Glucose, capillary     Status: Abnormal   Collection Time: 06/20/22 11:34 AM  Result Value Ref Range   Glucose-Capillary 132 (H) 70 - 99 mg/dL    Comment: Glucose reference range applies only to samples taken after fasting for at least 8 hours.  Glucose, capillary     Status: Abnormal   Collection Time: 06/20/22  4:26 PM  Result Value Ref Range   Glucose-Capillary 69 (L) 70 - 99 mg/dL    Comment: Glucose reference range applies only to samples taken after fasting for at least 8 hours.  Glucose, capillary     Status:  Abnormal   Collection Time: 06/20/22  5:32 PM  Result Value Ref Range   Glucose-Capillary 146 (H) 70 - 99 mg/dL    Comment: Glucose reference range applies only to samples taken after fasting for at least 8 hours.  Glucose, capillary     Status: Abnormal   Collection Time: 06/20/22  8:32 PM  Result  Value Ref Range   Glucose-Capillary 288 (H) 70 - 99 mg/dL    Comment: Glucose reference range applies only to samples taken after fasting for at least 8 hours.   Comment 1 Notify RN   Heparin level (unfractionated)     Status: None   Collection Time: 06/21/22 12:19 AM  Result Value Ref Range   Heparin Unfractionated 0.39 0.30 - 0.70 IU/mL    Comment: (NOTE) The clinical reportable range upper limit is being lowered to >1.10 to align with the FDA approved guidance for the current laboratory assay.  If heparin results are below expected values, and patient dosage has  been confirmed, suggest follow up testing of antithrombin III levels. Performed at [facility], [place] [address] [place], [place] [address]   CBC     Status: Abnormal   Collection Time: 06/21/22 12:19 AM  Result Value Ref Range   WBC 11.1 (H) 4.0 - 10.5 K/uL   RBC 4.02 (L) 4.22 - 5.81 MIL/uL   Hemoglobin 13.3 13.0 - 17.0 g/dL   HCT 39.1 39.0 - 52.0 %   MCV 97.3 80.0 - 100.0 fL   MCH 33.1 26.0 - 34.0 pg   MCHC 34.0 30.0 - 36.0 g/dL   RDW 12.9 11.5 - 15.5 %   Platelets 170 150 - 400 K/uL   nRBC 0.0 0.0 - 0.2 %    Comment: Performed at [facility], [place] [address], [place], [place] [address]  Glucose, capillary     Status: Abnormal   Collection Time: 06/21/22  6:12 AM  Result Value Ref Range   Glucose-Capillary 194 (H) 70 - 99 mg/dL    Comment: Glucose reference range applies only to samples taken after fasting for at least 8 hours.  Glucose, capillary     Status: Abnormal   Collection Time: 06/21/22 11:27 AM  Result Value Ref Range   Glucose-Capillary 186 (H) 70 - 99 mg/dL    Comment: Glucose reference  range applies only to samples taken after fasting for at least 8 hours.    VAS US DOPPLER PRE CABG  Result Date: 06/21/2022 PREOPERATIVE VASCULAR EVALUATION Patient Name:  Ian Gross  Date of Exam:   06/20/2022 Medical Rec #: [id number]          Accession #:    [id number] Date of Birth: Oct 24, 1975           Patient Gender: M Patient Age:   59 years Exam Location:  [facility] Procedure:      VAS US DOPPLER PRE CABG Referring Phys: HARRELL LIGHTFOOT --------------------------------------------------------------------------------  Indications:      Pre-CABG. Risk Factors:     Hypertension, Diabetes, current smoker. Comparison Study: No prior studies. Performing Technologist: McKayla Maag RVT, [place]  Examination Guidelines: A complete evaluation includes B-mode imaging, spectral Doppler, color Doppler, and power Doppler as needed of all accessible portions of each vessel. Bilateral testing is considered an integral part of a complete examination. Limited examinations for reoccurring indications may be performed as noted.  Right Carotid Findings: +----------+--------+--------+--------+--------+--------+           PSV cm/sEDV cm/sStenosisDescribeComments +----------+--------+--------+--------+--------+--------+ CCA Prox  145     14                               +----------+--------+--------+--------+--------+--------+ CCA Distal105     17                               +----------+--------+--------+--------+--------+--------+  ICA Prox  81      13      1-39%                    +----------+--------+--------+--------+--------+--------+ ICA Mid   94      17                               +----------+--------+--------+--------+--------+--------+ ICA Distal108     21                               +----------+--------+--------+--------+--------+--------+ ECA       76      9                                +----------+--------+--------+--------+--------+--------+  +----------+--------+-------+----------------+------------+           PSV cm/sEDV cmsDescribe        Arm Pressure +----------+--------+-------+----------------+------------+ [id number]            Multiphasic, WNL             +----------+--------+-------+----------------+------------+ +---------+--------+--+--------+--+---------+ VertebralPSV cm/s72EDV cm/s11Antegrade +---------+--------+--+--------+--+---------+ Left Carotid Findings: +----------+--------+--------+--------+--------+--------+           PSV cm/sEDV cm/sStenosisDescribeComments +----------+--------+--------+--------+--------+--------+ CCA Prox  167     11                               +----------+--------+--------+--------+--------+--------+ CCA Distal89      8                                +----------+--------+--------+--------+--------+--------+ ICA Prox  87      16                               +----------+--------+--------+--------+--------+--------+ ICA Mid   93      21                               +----------+--------+--------+--------+--------+--------+ ICA Distal89      24                               +----------+--------+--------+--------+--------+--------+ ECA       80      19                               +----------+--------+--------+--------+--------+--------+  +----------+--------+--------+----------------+------------+ SubclavianPSV cm/sEDV cm/sDescribe        Arm Pressure +----------+--------+--------+----------------+------------+           133             Multiphasic, WNL             +----------+--------+--------+----------------+------------+ +---------+--------+--+--------+-+---------+ VertebralPSV cm/s51EDV cm/s9Antegrade +---------+--------+--+--------+-+---------+  ABI Findings: +---------+------------------+-----+---------+---------------------------------+ Right    Rt Pressure (mmHg)IndexWaveform Comment                            +---------+------------------+-----+---------+---------------------------------+ Brachial  triphasicPressure not obtained due to IV                                            placement.                        +---------+------------------+-----+---------+---------------------------------+ PTA      103               0.99 triphasic                                  +---------+------------------+-----+---------+---------------------------------+ DP       102               0.98 biphasic                                   +---------+------------------+-----+---------+---------------------------------+ Great Toe67                0.64 Abnormal                                   +---------+------------------+-----+---------+---------------------------------+ +---------+------------------+-----+----------+-------+ Left     Lt Pressure (mmHg)IndexWaveform  Comment +---------+------------------+-----+----------+-------+ Brachial 104                    triphasic         +---------+------------------+-----+----------+-------+ PTA      65                0.62 monophasic        +---------+------------------+-----+----------+-------+ DP       67                0.64 monophasic        +---------+------------------+-----+----------+-------+ Great Toe49                0.47 Abnormal          +---------+------------------+-----+----------+-------+ +-------+---------------+----------------+ ABI/TBIToday's ABI/TBIPrevious ABI/TBI +-------+---------------+----------------+ Right  0.99/ 0.64                      +-------+---------------+----------------+ Left   0.64/ 0.47                      +-------+---------------+----------------+  Right Doppler Findings: +--------+--------+-----+----------+------------------------------------------+ Site    PressureIndexDoppler   Comments                                    +--------+--------+-----+----------+------------------------------------------+ Brachial             triphasic Pressure not obtained due to IV placement. +--------+--------+-----+----------+------------------------------------------+ Radial               monophasic                                           +--------+--------+-----+----------+------------------------------------------+ Ulnar                triphasic                                            +--------+--------+-----+----------+------------------------------------------+  Left Doppler Findings: +--------+--------+-----+---------+--------+ Site    PressureIndexDoppler  Comments +--------+--------+-----+---------+--------+ [id number]          triphasic         +--------+--------+-----+---------+--------+ Radial               triphasic         +--------+--------+-----+---------+--------+ Ulnar                triphasic         +--------+--------+-----+---------+--------+   Summary: Right Carotid: Velocities in the right ICA are consistent with a 1-39% stenosis. Left Carotid: Velocities in the left ICA are consistent with a 1-39% stenosis. Vertebrals:  Bilateral vertebral arteries demonstrate antegrade flow. Subclavians: Normal flow hemodynamics were seen in bilateral subclavian              arteries. Right ABI: Resting right ankle-brachial index is within normal range. The right toe-brachial index is abnormal. Left ABI: Resting left ankle-brachial index indicates moderate left lower extremity arterial disease. The left toe-brachial index is abnormal. Right Upper Extremity: Doppler waveforms decrease <50% with right radial compression. Doppler waveform obliterate with right ulnar compression. Left Upper Extremity: Doppler waveform obliterate with left radial compression. Doppler waveform obliterate with left ulnar compression.  Electronically signed by Orlie Pollen on 06/21/2022 at 3:41:22 AM.    Final      ROS Blood pressure (!) 124/56, pulse (!) 54, temperature 97.9 F (36.6 C), temperature source Oral, resp. rate 13, height 6' (1.829 m), weight 61.3 kg, SpO2 100 %. General appearance: alert, cooperative, and no distress Head: Normocephalic, without obvious abnormality, atraumatic Eyes: negative Nose: Nares normal. Septum midline. Mucosa normal. No drainage or sinus tenderness. Throat: Dental caries teeth # 6, 15, 18, 29, 30. No purulence, edema, fluctuance. Mild periodontal disease with pockets 3-28mm. No dental mobility or percussion sensitivity except carious teeth. Occlusion stable. Normal oral range of motion.  Neck: no adenopathy and supple, symmetrical, trachea midline  Assessment/Plan: Non-restorable teeth #'s  6, 15, 18, 29, 30. Impacted tooth #17. Plan extraction under [facility] on 06/23/2022.  Diona Browner 06/21/2022, 3:17 PM

## 2022-06-21 NOTE — TOC Progression Note (Signed)
Transition of Care [facility]) - Progression Note    Patient Details  Name: Lothar Pruyn MRN: [id number] Date of Birth: 1975-06-12  [facility] CM/SW Contact  Cyndi Bender, RN Phone Number: 06/21/2022, 2:14 PM  Clinical Narrative:     Patient scheduled for dental extractions on 06/23/22 at 1445.  TOC following.   Expected Discharge Plan: Home/Self Care Barriers to Discharge: Continued Medical Work up  Expected Discharge Plan and Services   Discharge Planning Services: CM Consult   Living arrangements for the past 2 months: Apartment                                       Social Determinants of Health (SDOH) Interventions SDOH Screenings   Food Insecurity: No Food Insecurity (09/03/2021)  Housing: Low Risk  (09/03/2021)  Transportation Needs: No Transportation Needs (09/03/2021)  Alcohol Screen: Low Risk  (06/02/2021)  Depression (PHQ2-9): Medium Risk (06/02/2021)  Financial Resource Strain: Low Risk  (09/03/2021)  Social Connections: Moderately Isolated (09/03/2021)  Stress: No Stress Concern Present (09/03/2021)  Tobacco Use: High Risk (06/16/2022)    Readmission Risk Interventions     No data to display

## 2022-06-22 LAB — BASIC METABOLIC PANEL
Anion gap: 6 (ref 5–15)
BUN: 20 mg/dL (ref 6–20)
CO2: 30 mmol/L (ref 22–32)
Calcium: 8.5 mg/dL — ABNORMAL LOW (ref 8.9–10.3)
Chloride: 97 mmol/L — ABNORMAL LOW (ref 98–111)
Creatinine, Ser: 1.2 mg/dL (ref 0.61–1.24)
GFR, Estimated: >60 mL/min (ref >60)
Glucose, Bld: 171 mg/dL — ABNORMAL HIGH (ref 70–99)
Potassium: 3.9 mmol/L (ref 3.5–5.1)
Sodium: 133 mmol/L — ABNORMAL LOW (ref 135–145)

## 2022-06-22 LAB — CBC
HCT: 39.7 % (ref 39.0–52.0)
Hemoglobin: 13.5 g/dL (ref 13.0–17.0)
MCH: 33.2 pg (ref 26.0–34.0)
MCHC: 34 g/dL (ref 30.0–36.0)
MCV: 97.5 fL (ref 80.0–100.0)
Platelets: 174 10*3/uL (ref 150–400)
RBC: 4.07 MIL/uL — ABNORMAL LOW (ref 4.22–5.81)
RDW: 12.8 % (ref 11.5–15.5)
WBC: 9.7 10*3/uL (ref 4.0–10.5)
nRBC: 0 % (ref 0.0–0.2)

## 2022-06-22 LAB — SURGICAL PCR SCREEN
MRSA, PCR: NEGATIVE
Staphylococcus aureus: NEGATIVE

## 2022-06-22 LAB — GLUCOSE, CAPILLARY
Glucose-Capillary: 91 mg/dL (ref 70–99)
Glucose-Capillary: 155 mg/dL — ABNORMAL HIGH (ref 70–99)
Glucose-Capillary: 261 mg/dL — ABNORMAL HIGH (ref 70–99)
Glucose-Capillary: 89 mg/dL (ref 70–99)

## 2022-06-22 LAB — HEPARIN LEVEL (UNFRACTIONATED): Heparin Unfractionated: 0.34 IU/mL (ref 0.30–0.70)

## 2022-06-22 MED ORDER — CHLORHEXIDINE GLUCONATE CLOTH 2 % EX PADS: 6.0000 | MEDICATED_PAD | Freq: Once | CUTANEOUS | Status: AC

## 2022-06-22 MED ORDER — PROPOFOL 1000 MG/100ML IV EMUL
INTRAVENOUS | Status: AC
  Filled 2022-06-22: qty 200

## 2022-06-22 MED ORDER — CEFAZOLIN SODIUM-DEXTROSE 2-4 GM/100ML-% IV SOLN
2.0000 g | INTRAVENOUS | Status: AC
  Administered 2022-06-23: 2 g via INTRAVENOUS
  Filled 2022-06-22: qty 100

## 2022-06-22 MED ORDER — PHENYLEPHRINE HCL-NACL 20-0.9 MG/250ML-% IV SOLN
INTRAVENOUS | Status: AC
  Filled 2022-06-22: qty 500

## 2022-06-22 MED ORDER — CHLORHEXIDINE GLUCONATE CLOTH 2 % EX PADS
6.0000 | MEDICATED_PAD | Freq: Once | CUTANEOUS | Status: AC
  Administered 2022-06-22: 6 via TOPICAL

## 2022-06-22 NOTE — Progress Notes (Addendum)
Per MD request spoke with Dr. Lupita Leash office who clarified dental surgery planned for 3pm tomorrow. Dr. Percival Spanish requests heparin be paused 3 hours before dental surgery, request relayed to pharmD who acknowledged.

## 2022-06-22 NOTE — Progress Notes (Signed)
Encouraged ambulation but refused, explained the importance of ambulation but claimed that his wife is coming and he will walk with her.

## 2022-06-22 NOTE — Progress Notes (Signed)
   Patient Name: Ian Gross Date of Encounter: 06/22/2022 [place] Cardiologist: Nelva Bush, MD   Interval Summary   No chest pain.  No SOB.   Vital Signs   Vitals:   06/21/22 1700 06/21/22 1917 06/21/22 2312 06/22/22 0342  BP:  (!) 123/57 (!) 107/53 (!) 121/59  Pulse: 65 60 62 63  Resp: 16 14 17 17   Temp:  98.4 F (36.9 C) 98.3 F (36.8 C) 98.2 F (36.8 C)  TempSrc:  Oral Oral Oral  SpO2: 99% 97% 100% 98%  Weight:      Height:        Intake/Output Summary (Last 24 hours) at 06/22/2022 0732 Last data filed at 06/21/2022 2100 Gross per 24 hour  Intake 600 ml  Output --  Net 600 ml      06/19/2022   11:04 AM 06/15/2022    5:45 PM 06/15/2022    3:55 PM  Last 3 Weights  Weight (lbs) 135 lb 2.3 oz 135 lb 2.3 oz 135 lb  Weight (kg) 61.3 kg 61.3 kg 61.236 kg      Telemetry/ECG    SB - Personally Reviewed  Physical Exam   GEN: No  acute distress.   Neck: No  JVD Cardiac: RRR, diastolic murmur unchanged, rubs, or gallops.  Respiratory: Clear   to auscultation bilaterally. GI: Soft, nontender, non-distended, normal bowel sounds  MS:  No edema; No deformity.   Assessment & Plan    Chest pain:  Anterior STEMI 06/15/22.     Awaiting CABG.    Very high grade proximal LAD stenosis.  Continue heparin   CM:  EF is 40 %.  Titrate meds as tolerated post op.  Unable to titrate beta blocker with low HR.  BP is marginal and will hold on starting ARNI.    AI:   Plan is for AVR CABG.  We will check with TCTS to make sure that they are aware of the dental surgery schedule.   Dental surgery planned for tomorrow.     IDDM:  Status post pancreatectomy.   Increased SSI yesterday.   Chronic pain:  Continue suboxone .    Risk reduction:    Continue Lipitor.    Hyponatremia:    Mild and stable.  Free water restrict.    Abnormal ABIs:  Moderate left lower extremity arterial disease. He has no claudication.  Continue risk reduction.    For questions or  updates, please contact [place] Please consult [email or web address] for contact info under        Signed, Minus Breeding, MD

## 2022-06-22 NOTE — Progress Notes (Addendum)
[place] for heparin Indication: chest pain/ACS  No Known Allergies  Patient Measurements: Height: 6' (182.9 cm) Weight: 61.3 kg (135 lb 2.3 oz) IBW/kg (Calculated) : 77.6 Heparin Dosing Weight: 61kg  Vital Signs: Temp: 98.2 F (36.8 C) (03/26 0747) Temp Source: Oral (03/26 0747) BP: 110/60 (03/26 0747) Pulse Rate: 72 (03/26 0747)  Labs: Recent Labs    06/20/22 0021 06/21/22 0019 06/22/22 0013  HGB 13.2 13.3 13.5  HCT 37.6* 39.1 39.7  PLT 175 170 174  HEPARINUNFRC 0.33 0.39 0.34  CREATININE 0.99  --  1.20     Estimated Creatinine Clearance: 64.6 mL/min (by C-G formula based on SCr of 1.2 mg/dL).    Assessment: Pt s/p cath which showed complete ostial/proximal LAD disease - PCI would be high risk. CVTS consult for AVR CABG, heparin resumed after TR band removal.  Also with moderate AI + poor dentition, awaiting further workup from oral surgery.   Heparin level therapeutic at 0.34 Current heparin infusion rate: 1100 units/hr  Hgb 13.5 and Plt 174 remain stable No s/sx of bleeding per RN report  Goal of Therapy:  Heparin level 0.3-0.7 units/ml Monitor platelets by anticoagulation protocol: Yes   Plan:  Continue heparin infusion at 1100 units/h Daily heparin level and CBC F/u plans for holding heparin prior to oral surgery on 3/27 Addendum: Plan for oral surgery at 15:00 on 3/27. Hold heparin starting at 12:00 on 3/27, prior to surgery.   Luisa Hart, PharmD, BCPS Clinical Pharmacist 06/22/2022 7:57 AM   Please refer to [facility] for pharmacy phone number

## 2022-06-22 NOTE — Progress Notes (Signed)
Pt declined ambulation for unspecified reasons.

## 2022-06-23 ENCOUNTER — Encounter (HOSPITAL_COMMUNITY)
Admission: EM | Disposition: A | Discharge status: Home Health | Payer: Self-pay | Source: Other Acute Inpatient Hospital | Attending: Thoracic Surgery (Cardiothoracic Vascular Surgery)

## 2022-06-23 ENCOUNTER — Inpatient Hospital Stay (HOSPITAL_COMMUNITY): Payer: Medicare Other | Admitting: Anesthesiology

## 2022-06-23 ENCOUNTER — Encounter (HOSPITAL_COMMUNITY): Payer: Self-pay | Admitting: Student

## 2022-06-23 ENCOUNTER — Other Ambulatory Visit: Payer: Self-pay

## 2022-06-23 DIAGNOSIS — I2510 Atherosclerotic heart disease of native coronary artery without angina pectoris: Secondary | ICD-10-CM | POA: Diagnosis not present

## 2022-06-23 DIAGNOSIS — F17210 Nicotine dependence, cigarettes, uncomplicated: Secondary | ICD-10-CM

## 2022-06-23 DIAGNOSIS — I252 Old myocardial infarction: Secondary | ICD-10-CM | POA: Diagnosis not present

## 2022-06-23 DIAGNOSIS — K029 Dental caries, unspecified: Secondary | ICD-10-CM

## 2022-06-23 HISTORY — PX: DENTAL RESTORATION/EXTRACTION WITH X-RAY: SHX5796

## 2022-06-23 LAB — CBC
HCT: 42.8 % (ref 39.0–52.0)
Hemoglobin: 14.3 g/dL (ref 13.0–17.0)
MCH: 32.6 pg (ref 26.0–34.0)
MCHC: 33.4 g/dL (ref 30.0–36.0)
MCV: 97.5 fL (ref 80.0–100.0)
Platelets: 157 10*3/uL (ref 150–400)
RBC: 4.39 MIL/uL (ref 4.22–5.81)
RDW: 13 % (ref 11.5–15.5)
WBC: 10.7 10*3/uL — ABNORMAL HIGH (ref 4.0–10.5)
nRBC: 0 % (ref 0.0–0.2)

## 2022-06-23 LAB — GLUCOSE, CAPILLARY
Glucose-Capillary: 143 mg/dL — ABNORMAL HIGH (ref 70–99)
Glucose-Capillary: 129 mg/dL — ABNORMAL HIGH (ref 70–99)
Glucose-Capillary: 160 mg/dL — ABNORMAL HIGH (ref 70–99)
Glucose-Capillary: 194 mg/dL — ABNORMAL HIGH (ref 70–99)
Glucose-Capillary: 142 mg/dL — ABNORMAL HIGH (ref 70–99)

## 2022-06-23 LAB — HEPARIN LEVEL (UNFRACTIONATED): Heparin Unfractionated: 0.31 IU/mL (ref 0.30–0.70)

## 2022-06-23 SURGERY — DENTAL RESTORATION/EXTRACTION WITH X-RAY
Anesthesia: General

## 2022-06-23 MED ORDER — ROCURONIUM BROMIDE 10 MG/ML (PF) SYRINGE
PREFILLED_SYRINGE | INTRAVENOUS | Status: AC
  Filled 2022-06-23: qty 10

## 2022-06-23 MED ORDER — ONDANSETRON HCL 4 MG/2ML IJ SOLN
INTRAMUSCULAR | Status: AC
  Filled 2022-06-23: qty 2

## 2022-06-23 MED ORDER — 0.9 % SODIUM CHLORIDE (POUR BTL) OPTIME
TOPICAL | Status: DC | PRN
  Administered 2022-06-23: 1000 mL

## 2022-06-23 MED ORDER — THROMBIN 5000 UNITS EX SOLR
CUTANEOUS | Status: AC
  Filled 2022-06-23: qty 5000

## 2022-06-23 MED ORDER — ONDANSETRON HCL 4 MG/2ML IJ SOLN
INTRAMUSCULAR | Status: DC | PRN
  Administered 2022-06-23: 4 mg via INTRAVENOUS

## 2022-06-23 MED ORDER — NOREPINEPHRINE 4 MG/250ML-% IV SOLN
INTRAVENOUS | Status: AC
  Filled 2022-06-23: qty 250

## 2022-06-23 MED ORDER — WHITE PETROLATUM EX OINT
TOPICAL_OINTMENT | CUTANEOUS | Status: DC | PRN
  Administered 2022-06-24: 0.2 via TOPICAL
  Filled 2022-06-23: qty 28.35

## 2022-06-23 MED ORDER — PROMETHAZINE HCL 25 MG/ML IJ SOLN: 6.2500 mg | INTRAMUSCULAR | Status: DC | PRN

## 2022-06-23 MED ORDER — ACETAMINOPHEN 500 MG PO TABS
1000.0000 mg | ORAL_TABLET | Freq: Once | ORAL | Status: AC
  Administered 2022-06-23: 1000 mg via ORAL
  Filled 2022-06-23: qty 2

## 2022-06-23 MED ORDER — OXYMETAZOLINE HCL 0.05 % NA SOLN
NASAL | Status: DC | PRN
  Administered 2022-06-23: 2 via NASAL

## 2022-06-23 MED ORDER — FENTANYL CITRATE (PF) 100 MCG/2ML IJ SOLN: 25.0000 ug | INTRAMUSCULAR | Status: DC | PRN

## 2022-06-23 MED ORDER — VASOPRESSIN 20 UNIT/ML IV SOLN
INTRAVENOUS | Status: AC
  Filled 2022-06-23: qty 1

## 2022-06-23 MED ORDER — LIDOCAINE-EPINEPHRINE 2 %-1:100000 IJ SOLN
INTRAMUSCULAR | Status: AC
  Filled 2022-06-23: qty 1

## 2022-06-23 MED ORDER — HEMOSTATIC AGENTS (NO CHARGE) OPTIME
TOPICAL | Status: DC | PRN
  Administered 2022-06-23: 1 via TOPICAL

## 2022-06-23 MED ORDER — LIDOCAINE 2% (20 MG/ML) 5 ML SYRINGE
INTRAMUSCULAR | Status: DC | PRN
  Administered 2022-06-23: 100 mg via INTRAVENOUS

## 2022-06-23 MED ORDER — SUGAMMADEX SODIUM 200 MG/2ML IV SOLN
INTRAVENOUS | Status: DC | PRN
  Administered 2022-06-23: 120 mg via INTRAVENOUS
  Administered 2022-06-23: 60 mg via INTRAVENOUS

## 2022-06-23 MED ORDER — MIDAZOLAM HCL 2 MG/2ML IJ SOLN
INTRAMUSCULAR | Status: DC | PRN
  Administered 2022-06-23: 2 mg via INTRAVENOUS

## 2022-06-23 MED ORDER — CHLORHEXIDINE GLUCONATE 0.12 % MT SOLN
15.0000 mL | Freq: Once | OROMUCOSAL | Status: AC
  Administered 2022-06-23: 15 mL via OROMUCOSAL
  Filled 2022-06-23: qty 15

## 2022-06-23 MED ORDER — AMISULPRIDE (ANTIEMETIC) 5 MG/2ML IV SOLN: 10.0000 mg | Freq: Once | INTRAVENOUS | Status: DC | PRN

## 2022-06-23 MED ORDER — FENTANYL CITRATE (PF) 250 MCG/5ML IJ SOLN
INTRAMUSCULAR | Status: DC | PRN
  Administered 2022-06-23: 50 ug via INTRAVENOUS
  Administered 2022-06-23: 100 ug via INTRAVENOUS

## 2022-06-23 MED ORDER — DEXAMETHASONE SODIUM PHOSPHATE 10 MG/ML IJ SOLN
INTRAMUSCULAR | Status: AC
  Filled 2022-06-23: qty 1

## 2022-06-23 MED ORDER — LACTATED RINGERS IV SOLN: INTRAVENOUS | Status: DC

## 2022-06-23 MED ORDER — HEPARIN (PORCINE) 25000 UT/250ML-% IV SOLN
1200.0000 [IU]/h | INTRAVENOUS | Status: DC
  Administered 2022-06-24 – 2022-06-25 (×2): 1200 [IU]/h via INTRAVENOUS
  Filled 2022-06-23 (×2): qty 250

## 2022-06-23 MED ORDER — MIDAZOLAM HCL 2 MG/2ML IJ SOLN
INTRAMUSCULAR | Status: AC
  Filled 2022-06-23: qty 2

## 2022-06-23 MED ORDER — MEPERIDINE HCL 25 MG/ML IJ SOLN: 6.2500 mg | INTRAMUSCULAR | Status: DC | PRN

## 2022-06-23 MED ORDER — OXYMETAZOLINE HCL 0.05 % NA SOLN
NASAL | Status: AC
  Filled 2022-06-23: qty 30

## 2022-06-23 MED ORDER — INSULIN ASPART 100 UNIT/ML IJ SOLN
0.0000 [IU] | INTRAMUSCULAR | Status: DC | PRN
  Administered 2022-06-23: 2 [IU] via SUBCUTANEOUS

## 2022-06-23 MED ORDER — DEXAMETHASONE SODIUM PHOSPHATE 10 MG/ML IJ SOLN
INTRAMUSCULAR | Status: DC | PRN
  Administered 2022-06-23: 4 mg via INTRAVENOUS

## 2022-06-23 MED ORDER — SODIUM CHLORIDE 0.9 % IR SOLN
Status: DC | PRN
  Administered 2022-06-23: 1000 mL

## 2022-06-23 MED ORDER — ROCURONIUM BROMIDE 10 MG/ML (PF) SYRINGE
PREFILLED_SYRINGE | INTRAVENOUS | Status: DC | PRN
  Administered 2022-06-23: 40 mg via INTRAVENOUS

## 2022-06-23 MED ORDER — OXYCODONE-ACETAMINOPHEN 5-325 MG PO TABS
1.0000 | ORAL_TABLET | ORAL | Status: DC | PRN
  Administered 2022-06-23 – 2022-06-25 (×11): 2 via ORAL
  Administered 2022-06-25: 1 via ORAL
  Administered 2022-06-26 – 2022-06-28 (×13): 2 via ORAL
  Filled 2022-06-23 (×26): qty 2

## 2022-06-23 MED ORDER — FENTANYL CITRATE (PF) 250 MCG/5ML IJ SOLN
INTRAMUSCULAR | Status: AC
  Filled 2022-06-23: qty 5

## 2022-06-23 MED ORDER — ORAL CARE MOUTH RINSE: 15.0000 mL | Freq: Once | OROMUCOSAL | Status: AC

## 2022-06-23 MED ORDER — EPHEDRINE SULFATE-NACL 50-0.9 MG/10ML-% IV SOSY
PREFILLED_SYRINGE | INTRAVENOUS | Status: DC | PRN
  Administered 2022-06-23: 10 mg via INTRAVENOUS

## 2022-06-23 MED ORDER — PHENYLEPHRINE 80 MCG/ML (10ML) SYRINGE FOR IV PUSH (FOR BLOOD PRESSURE SUPPORT)
PREFILLED_SYRINGE | INTRAVENOUS | Status: DC | PRN
  Administered 2022-06-23: 160 ug via INTRAVENOUS
  Administered 2022-06-23 (×4): 80 ug via INTRAVENOUS

## 2022-06-23 MED ORDER — ESMOLOL HCL 100 MG/10ML IV SOLN
INTRAVENOUS | Status: DC | PRN
  Administered 2022-06-23 (×2): 20 mg via INTRAVENOUS

## 2022-06-23 MED ORDER — PROPOFOL 10 MG/ML IV BOLUS
INTRAVENOUS | Status: AC
  Filled 2022-06-23: qty 20

## 2022-06-23 MED ORDER — LIDOCAINE 2% (20 MG/ML) 5 ML SYRINGE
INTRAMUSCULAR | Status: AC
  Filled 2022-06-23: qty 5

## 2022-06-23 MED ORDER — PROPOFOL 10 MG/ML IV BOLUS
INTRAVENOUS | Status: DC | PRN
  Administered 2022-06-23: 100 mg via INTRAVENOUS
  Administered 2022-06-23: 10 mg via INTRAVENOUS
  Administered 2022-06-23: 40 mg via INTRAVENOUS

## 2022-06-23 MED ORDER — LIDOCAINE-EPINEPHRINE 2 %-1:100000 IJ SOLN
INTRAMUSCULAR | Status: DC | PRN
  Administered 2022-06-23: 20 mL

## 2022-06-23 SURGICAL SUPPLY — 38 items
BAG COUNTER SPONGE SURGICOUNT (BAG) IMPLANT
BAG SPNG CNTER NS LX DISP (BAG)
BLADE SURG 15 STRL LF DISP TIS (BLADE) ×1 IMPLANT
BLADE SURG 15 STRL SS (BLADE) ×1
BUR CROSS CUT FISSURE 1.6 (BURR) ×1 IMPLANT
BUR EGG ELITE 4.0 (BURR) ×1 IMPLANT
BUR SURG 4X8 MED (BURR) IMPLANT
BURR SURG 4X8 MED (BURR) ×1
CANISTER SUCT 3000ML PPV (MISCELLANEOUS) ×1 IMPLANT
COVER SURGICAL LIGHT HANDLE (MISCELLANEOUS) ×1 IMPLANT
GAUZE PACKING FOLDED 2  STR (GAUZE/BANDAGES/DRESSINGS) ×1
GAUZE PACKING FOLDED 2 STR (GAUZE/BANDAGES/DRESSINGS) ×1 IMPLANT
GLOVE BIO SURGEON STRL SZ 6.5 (GLOVE) IMPLANT
GLOVE BIO SURGEON STRL SZ7 (GLOVE) IMPLANT
GLOVE BIO SURGEON STRL SZ8 (GLOVE) ×1 IMPLANT
GLOVE BIOGEL PI IND STRL 6.5 (GLOVE) IMPLANT
GLOVE BIOGEL PI IND STRL 7.0 (GLOVE) IMPLANT
GOWN STRL REUS W/ TWL LRG LVL3 (GOWN DISPOSABLE) ×1 IMPLANT
GOWN STRL REUS W/ TWL XL LVL3 (GOWN DISPOSABLE) ×1 IMPLANT
GOWN STRL REUS W/TWL LRG LVL3 (GOWN DISPOSABLE) ×1
GOWN STRL REUS W/TWL XL LVL3 (GOWN DISPOSABLE) ×1
IV NS 1000ML (IV SOLUTION) ×1
IV NS 1000ML BAXH (IV SOLUTION) ×1 IMPLANT
KIT BASIN OR (CUSTOM PROCEDURE TRAY) ×1 IMPLANT
KIT TURNOVER KIT B (KITS) ×1 IMPLANT
NDL HYPO 25GX1X1/2 BEV (NEEDLE) ×2 IMPLANT
NEEDLE HYPO 25GX1X1/2 BEV (NEEDLE) ×2 IMPLANT
NS IRRIG 1000ML POUR BTL (IV SOLUTION) ×1 IMPLANT
PAD ARMBOARD 7.5X6 YLW CONV (MISCELLANEOUS) ×1 IMPLANT
SLEEVE IRRIGATION ELITE 7 (MISCELLANEOUS) ×1 IMPLANT
SPIKE FLUID TRANSFER (MISCELLANEOUS) ×1 IMPLANT
SPONGE SURGIFOAM ABS GEL 12-7 (HEMOSTASIS) IMPLANT
SUT CHROMIC 3 0 PS 2 (SUTURE) ×1 IMPLANT
SYR BULB IRRIG 60ML STRL (SYRINGE) ×1 IMPLANT
SYR CONTROL 10ML LL (SYRINGE) ×1 IMPLANT
TRAY ENT MC OR (CUSTOM PROCEDURE TRAY) ×1 IMPLANT
TUBING IRRIGATION (MISCELLANEOUS) ×1 IMPLANT
YANKAUER SUCT BULB TIP NO VENT (SUCTIONS) ×1 IMPLANT

## 2022-06-23 NOTE — Progress Notes (Signed)
Dr. Hoyt Koch paged regarding medications prior to oral surgery.  Requested to hold aspirin prior to oral surgery. Ok to receive all other medications.

## 2022-06-23 NOTE — Anesthesia Preprocedure Evaluation (Addendum)
Anesthesia Evaluation  Patient identified by MRN, date of birth, ID band Patient awake    Reviewed: Allergy & Precautions, NPO status , Patient's Chart, lab work & pertinent test results, Unable to perform ROS - Chart review only  History of Anesthesia Complications Negative for: history of anesthetic complications  Airway Mallampati: II  TM Distance: >3 FB Neck ROM: Full    Dental  (+) Missing,    Pulmonary Current Smoker and Patient abstained from smoking.   Pulmonary exam normal        Cardiovascular + CAD and + Past MI  Normal cardiovascular exam  Echo 06/16/2022  1. Left ventricular ejection fraction, by estimation, is 40%. The left ventricle has mild to moderately decreased function. The left ventricle demonstrates regional wall motion abnormalities with mid to apical anteroseptal and inferoseptal akinesis and anterior hypokinesis. The left ventricular internal cavity size was mildly dilated. Left ventricular diastolic parameters were normal.   2. Right ventricular systolic function is normal. The right ventricular size is normal. Tricuspid regurgitation signal is inadequate for assessing PA pressure.   3. The mitral valve is normal in structure. No evidence of mitral valve regurgitation. No evidence of mitral stenosis.   4. The aortic valve is probaby tricuspid though not visualized ideally. There is mild calcification of the aortic valve. Aortic valve regurgitation is moderate to severe. There was no holodiastolic flow reversal noted in the descending thoracic aorta. No aortic stenosis is present.   5. The inferior vena cava is normal in size with <50% respiratory variability, suggesting right atrial pressure of 8 mmHg.    '24 Cath  1.Severe complex ostial/proximal LAD involving moderate-caliber D1 that is difficult to visualize due to vessel overlap.  Lesion appears to be an acute plaque rupture with up to 90% stenosis involving  the LAD and 75% stenosis involving D1 leading to the patient's NSTEMI.  LMCA, ramus intermedius, LCx, and RCA are without significant disease. 2.Mildly reduced left ventricular systolic function with mild mid and apical anterior and apical inferior hypokinesis (LVEF 45-50%). 3.Normal left ventricular filling pressure (LVEDP 6 mmHg).    Neuro/Psych    GI/Hepatic   Endo/Other  diabetes, Type 2, Insulin Dependent    Renal/GU      Musculoskeletal   Abdominal   Peds  Hematology  S/p splenectomy    Anesthesia Other Findings   Reproductive/Obstetrics                              Anesthesia Physical Anesthesia Plan  ASA: 4  Anesthesia Plan: General   Post-op Pain Management: Tylenol PO (pre-op)* and Minimal or no pain anticipated   Induction: Intravenous  PONV Risk Score and Plan: 2 and Treatment may vary due to age or medical condition, Ondansetron, Dexamethasone and Midazolam  Airway Management Planned: Nasal ETT  Additional Equipment: ClearSight  Intra-op Plan:   Post-operative Plan: Extubation in OR  Informed Consent: I have reviewed the patients History and Physical, chart, labs and discussed the procedure including the risks, benefits and alternatives for the proposed anesthesia with the patient or authorized representative who has indicated his/her understanding and acceptance.     Dental advisory given  Plan Discussed with: CRNA  Anesthesia Plan Comments:          Anesthesia Quick Evaluation

## 2022-06-23 NOTE — Anesthesia Procedure Notes (Signed)
Procedure Name: Intubation Date/Time: 06/23/2022 3:31 PM  Performed by: Reggie Pile, CRNAPre-anesthesia Checklist: Patient identified, Emergency Drugs available, Suction available and Patient being monitored Patient Re-evaluated:Patient Re-evaluated prior to induction Oxygen Delivery Method: Circle system utilized Preoxygenation: Pre-oxygenation with 100% oxygen Induction Type: IV induction Ventilation: Mask ventilation without difficulty Laryngoscope Size: Glidescope and 3 Grade View: Grade I Nasal Tubes: Nasal prep performed, Nasal Rae, Right and Magill forceps- large, utilized Tube size: 7.0 mm Number of attempts: 1 Placement Confirmation: ETT inserted through vocal cords under direct vision, positive ETCO2 and breath sounds checked- equal and bilateral Tube secured with: Tape Dental Injury: Teeth and Oropharynx as per pre-operative assessment  Comments: Both nares sprayed x two with Afrin in pre op. Right nare with red rubber passed through and Magills used to bring out the mouth. Tube advanced under direct visualization  through the cords

## 2022-06-23 NOTE — Progress Notes (Signed)
Arrived back to room from PACU post oral surgery. Placed on cardiac monitoring and vitals obtained. Handoff completed with PACU RN.

## 2022-06-23 NOTE — Progress Notes (Signed)
Patient's AM CBG 143, Pharmacist Luisa Hart asked if this RN would check with patient if he would be agreeable to SSI dose. This RN spoke with patient who stated he did not want to take any insulin at all while he is NPO for surgery.  Pharmacist Luisa Hart notified.

## 2022-06-23 NOTE — H&P (Signed)
H&P documentation  -History and Physical Reviewed  -Patient has been re-examined  -No change in the plan of care  Ian Gross  

## 2022-06-23 NOTE — Progress Notes (Signed)
[place] for heparin Indication: chest pain/ACS  No Known Allergies  Patient Measurements: Height: 6' (182.9 cm) Weight: 61.3 kg (135 lb 2.3 oz) IBW/kg (Calculated) : 77.6 Heparin Dosing Weight: 61kg  Vital Signs: Temp: 98.3 F (36.8 C) (03/27 0450) Temp Source: Oral (03/27 0450) BP: 106/77 (03/27 0450) Pulse Rate: 60 (03/27 0450)  Labs: Recent Labs    06/21/22 0019 06/22/22 0013 06/23/22 0020  HGB 13.3 13.5 14.3  HCT 39.1 39.7 42.8  PLT 170 174 157  HEPARINUNFRC 0.39 0.34 0.31  CREATININE  --  1.20  --      Estimated Creatinine Clearance: 64.6 mL/min (by C-G formula based on SCr of 1.2 mg/dL).    Assessment: Pt s/p cath which showed complete ostial/proximal LAD disease - PCI would be high risk. CVTS consult for AVR CABG, heparin resumed after TR band removal.  Also with moderate AI + poor dentition.   Heparin level therapeutic at 0.31 Current heparin infusion rate: 1100 units/hr  Hgb 14.3 and Plt 157 remain stable No s/sx of bleeding per RN report  Goal of Therapy:  Heparin level 0.3-0.7 units/ml Monitor platelets by anticoagulation protocol: Yes   Plan:  Continue heparin infusion at 1100 units/h until 3 hours prior to oral surgery on 3/27 at 15:00 (confirmed with Dr. Hoyt Koch) STOP heparin infusion at 12:00 on 3/27 F/u plans for resuming heparin post-op Consider restarting heparin at 1200 units/hr post-op given downward trend in heparin levels at current infusion rate AVR CABG tentatively planned for 06/28/22 with Dr. Kipp Brood   Luisa Hart, PharmD, BCPS Clinical Pharmacist 06/23/2022 8:48 AM   Please refer to AMION for pharmacy phone number

## 2022-06-23 NOTE — Op Note (Unsigned)
Ian Gross, [place] MEDICAL RECORD NO: [id number] ACCOUNT NO: [id number] DATE OF BIRTH: [birth year] FACILITY: [facility] LOCATION: [facility] PHYSICIAN: Gae Bon, DDS  Operative Report   DATE OF PROCEDURE: 06/23/2022  PREOPERATIVE DIAGNOSES:  Nonrestorable teeth numbers 6, 15 18, 29, 30 secondary to dental caries and periodontal disease. Impacted tooth number 17.  POSTOPERATIVE DIAGNOSES:  Nonrestorable teeth numbers 6, 15 18, 29, 30 secondary to dental caries and periodontal disease. Impacted tooth number 17.  Nonrestorable tooth number 28.  PROCEDURE:  Extraction teeth numbers 6 ,15, 17, 18, 28, 29, 30.  SURGEON:  Gae Bon, DDS  ANESTHESIA:  General, nasal intubation.  Attending was Dr. Daiva Huge.  DESCRIPTION OF PROCEDURE:  The patient was taken to the operating room and placed on the table in supine position.  General anesthesia was administered. Nasal endotracheal tube was placed and secured.  The eyes were protected.  The patient was draped for  surgery.  Timeout was performed.  The posterior pharynx was suctioned and a throat pack was placed.  2% lidocaine in 1:100,000 epinephrine was infiltrated in an inferior alveolar block on the right and left side and in buccal and palatal infiltration  around tooth numbers 6 and 15.  A bite block was placed on the right side of the mouth.  A sweetheart retractor was used to retract the tongue.  A #15 blade was used to make an incision around tooth number 15 in the maxilla and the gingival sulcus and  around teeth numbers 17 and 18 in the mandible.  The periosteum was reflected.  The teeth were elevated, but could not be removed.  Bone was removed around teeth numbers 17 and 18, and then tooth number 18 was removed with the dental forceps, however,  the tooth fractured necessitating removal of bone around the residual roots.  The roots were removed using the 301 elevator and the root tip pick.  Then, tooth #17 was removed with the 301 elevator.   The sockets were curetted, irrigated and closed with  3-0 chromic.  The number 15 was elevated, but could not be removed.  The forceps was attempted, but the tooth fractured.  The tooth was then sectioned with a Stryker handpiece and a fissure bur under irrigation and then the socket was curetted and  irrigated and closed with 3-0 chromic.  Then, the bite block was repositioned to the other side of the mouth.  A 15 blade was used to make an incision around 29 and 30. The periosteum was reflected.  The teeth were elevated, but could not be removed.   Tooth #29 was grasped with the Ash forceps and removed, but the portion of the tooth fractured.  There was noted to be decay on the root surface of tooth number 28, so this tooth was removed as well using the 301 elevator and the dental forceps.  Then,  tooth number 30 was elevated, but could not be removed. The forceps was used, but the tooth fractured.  The roots of 29 and 30 were then removed by using the Stryker handpiece with a fissure bur under irrigation to remove bone around the residual roots  and then the roots were removed with 301 elevator and a root pick.  The sockets were curetted, irrigated and closed primarily with 3-0 chromic.  Then, in the area of tooth number 6, the 15 blade was used to make an incision around the gingival sulcus of  the tooth.  The periosteum was elevated.  The tooth was  elevated with a 301 elevator and removed with the universal forceps.  Then, the socket was curetted.  Gelfoam was placed and then the area was closed with 3-0 chromic.  The oral cavity was then  irrigated and suctioned.  The throat pack was removed.  Additional local anesthesia was administered and the patient was left under care of anesthesia for extubation and transport to recovery room with plans to return to the floor.  ESTIMATED BLOOD LOSS:  Minimal.  COMPLICATIONS:  None.  SPECIMENS:  None.   VAI D: 06/23/2022 4:19:37 pm T: 06/23/2022 9:37:00 pm   JOB: [id number]

## 2022-06-23 NOTE — Op Note (Signed)
06/23/2022  4:14 PM  PATIENT:  Ian Gross  47 y.o. male  PRE-OPERATIVE DIAGNOSIS:  NON-RESTORABLE TEETH # 6, 15,  18, 29, 30 SECONDARY TO DENTAL CARIES, IMPACTED TOOTH #17   POST-OPERATIVE DIAGNOSIS:  SAME + NONRESTORABLE TOOTH 28  PROCEDURE:  Procedure(s): EXTRACTION  TEETH # 6, 15, 17, 18, 28, 29, 30   SURGEON:  Surgeon(s): Diona Browner, DMD  ANESTHESIA:   local and general  EBL:  minimal  DRAINS: none   SPECIMEN:  No Specimen  COUNTS:  YES  PLAN OF CARE: Discharge to home after PACU  PATIENT DISPOSITION:  PACU - hemodynamically stable.   PROCEDURE DETAILS: Dictation [id number]  Gae Bon, DMD 06/23/2022 4:14 PM

## 2022-06-23 NOTE — Progress Notes (Signed)
CBG upon return to unit from PACU was 183. Due for SSI of 3u Novolog per SSI orders. Did not want to take 3 units, agreeable to 2 units, see MAR for details.

## 2022-06-23 NOTE — Progress Notes (Signed)
   Patient Name: Ian Gross Date of Encounter: 06/23/2022 [place] Cardiologist: Nelva Bush, MD   Interval Summary   No chest pain.  No SOB.   Vital Signs   Vitals:   06/22/22 1915 06/23/22 0000 06/23/22 0450 06/23/22 0805  BP: (!) 97/58 (!) 113/59 106/77 (!) 120/58  Pulse: (!) 57 (!) 51 60 60  Resp: 15 12 17 16   Temp: 98.4 F (36.9 C) 98.5 F (36.9 C) 98.3 F (36.8 C) 98 F (36.7 C)  TempSrc: Oral Oral Oral Oral  SpO2: 97% 98% 97% 99%  Weight:      Height:        Intake/Output Summary (Last 24 hours) at 06/23/2022 0901 Last data filed at 06/22/2022 1800 Gross per 24 hour  Intake 499.02 ml  Output --  Net 499.02 ml      06/19/2022   11:04 AM 06/15/2022    5:45 PM 06/15/2022    3:55 PM  Last 3 Weights  Weight (lbs) 135 lb 2.3 oz 135 lb 2.3 oz 135 lb  Weight (kg) 61.3 kg 61.3 kg 61.236 kg      Telemetry/ECG    NSR - Personally Reviewed  Physical Exam   GEN: No  acute distress.   Neck: No  JVD Cardiac: RRR, diastolic murmur unchanged from previous. No systolic murmurs, rubs, or gallops.  Respiratory: Clear   to auscultation bilaterally. GI: Soft, nontender, non-distended, normal bowel sounds  MS:   No edema; No deformity.   Assessment & Plan    Chest pain:  Anterior STEMI 06/15/22.     Awaiting CABG.    Very high grade proximal LAD stenosis.  Continue heparin.   CM:  EF is 40 %.  Titrate meds as tolerated post op.  Unable to titrate beta blocker with low HR. Labile BPs.  No med titration. Euvolemic.   AI:   Plan is for AVR CABG.  Discussed timing with TCTS.  Dental extraction today.     IDDM:  Status post pancreatectomy.   Increased SSI.  Continue current coverage.   Chronic pain:  Continue suboxone .    Risk reduction:    Continue Lipitor.    Hyponatremia:    Mild and stable.  Free water restrict.    Check Na level in the AM.    Abnormal ABIs:  Left moderate disease.  No claudication.  Risk reduction   For questions or  updates, please contact [place] Please consult [email or web address] for contact info under        Signed, Minus Breeding, MD

## 2022-06-23 NOTE — Progress Notes (Signed)
Patient transported to Short Stay for Pre-Op at this time.

## 2022-06-23 NOTE — Transfer of Care (Signed)
Immediate Anesthesia Transfer of Care Note  Patient: Ian Gross  Procedure(s) Performed: DENTAL RESTORATION/EXTRACTION WITH POSSIBLE X-RAY  Patient Location: PACU  Anesthesia Type:General  Level of Consciousness: awake and alert   Airway & Oxygen Therapy: Patient Spontanous Breathing and Patient connected to nasal cannula oxygen  Post-op Assessment: Report given to RN and Post -op Vital signs reviewed and stable  Post vital signs: Reviewed and stable  Last Vitals:  Vitals Value Taken Time  BP 132/66 06/23/22 1625  Temp    Pulse 90 06/23/22 1627  Resp 13 06/23/22 1627  SpO2 100 % 06/23/22 1627  Vitals shown include unvalidated device data.  Last Pain:  Vitals:   06/23/22 1106  TempSrc: Oral  PainSc:       Patients Stated Pain Goal: 0 ([id number])  Complications: No notable events documented.

## 2022-06-23 NOTE — Progress Notes (Signed)
[place] for heparin Indication: chest pain/ACS  No Known Allergies  Patient Measurements: Height: 6' (182.9 cm) Weight: 61.2 kg (135 lb) IBW/kg (Calculated) : 77.6 Heparin Dosing Weight: 61kg  Vital Signs: Temp: 97.7 F (36.5 C) (03/27 1655) Temp Source: Oral (03/27 1106) BP: 124/68 (03/27 1655) Pulse Rate: 82 (03/27 1655)  Labs: Recent Labs    06/21/22 0019 06/22/22 0013 06/23/22 0020  HGB 13.3 13.5 14.3  HCT 39.1 39.7 42.8  PLT 170 174 157  HEPARINUNFRC 0.39 0.34 0.31  CREATININE  --  1.20  --      Estimated Creatinine Clearance: 64.5 mL/min (by C-G formula based on SCr of 1.2 mg/dL).    Assessment: Pt s/p cath which showed complete ostial/proximal LAD disease - PCI would be high risk. CVTS consult for AVR CABG, heparin resumed after TR band removal.  Also with moderate AI + poor dentition.   Heparin level therapeutic at 0.31 Current heparin infusion rate: 1100 units/hr  Hgb 14.3 and Plt 157 remain stable No s/sx of bleeding per RN report  PM UPDATE:  Patient s/p tooth extraction of multiple teeth. D/W Dr. Hoyt Koch DMD who wishes to continue holding heparin for 12 hours post procedure. Will restart heparin at 0400 on 3/28 at 1200 units/hr (due to down trending HL earlier)  Goal of Therapy:  Heparin level 0.3-0.7 units/ml Monitor platelets by anticoagulation protocol: Yes   Plan:  Restart heparin infusion at 1200 units/h at 0400 on 3/28 per dentistry Heparin level 6 hours after restart AVR CABG tentatively planned for 06/28/22 with Dr. Boyd Kerbs A. Levada Dy, PharmD, BCPS, FNKF Clinical Pharmacist [place] Please utilize Amion for appropriate phone number to reach the unit pharmacist ([place])  06/23/2022 5:00 PM   Please refer to [facility] for pharmacy phone number

## 2022-06-23 NOTE — Consult Note (Signed)
   [facility] CM Inpatient Consult   06/23/2022  Ian Gross [birth year] [id number]  [place] [facility] [ACO] Patient: [employer] Medicare   Primary Care Provider:  Jearld Fenton, NP patient with [facility]   Patient screened for less than 30 day readmission hospitalization (planned admission transfer from [facility]) 8 day length of stay and  to assess for potential [place] Management service needs for post hospital transition for care coordination.  Review of patient's electronic medical record reveals patient is for ongoing medical and surgical management noted.  11:12 am Met with patient at the bedside to explain [place] for post hospital support if needed and that [place] will follow hospital progress and assess for [place] needs as patient has ongoing interventions noted.   Plan:  Continue to follow progress and disposition to assess for post hospital community care coordination/management needs.  Referral request for community care coordination: currently unknow disposition/needs.  Of note, [facility] Care Management/Population Health does not replace or interfere with any arrangements made by the Inpatient Transition of Care team.  For questions contact:   Natividad Brood, RN BSN [place]  [phone] business mobile phone Toll free office [phone]  *[place]  [phone] Fax number: [phone] [email or web address]

## 2022-06-24 ENCOUNTER — Encounter (HOSPITAL_COMMUNITY): Payer: Self-pay | Admitting: Oral Surgery

## 2022-06-24 LAB — CBC
HCT: 39.8 % (ref 39.0–52.0)
Hemoglobin: 13.6 g/dL (ref 13.0–17.0)
MCH: 32.9 pg (ref 26.0–34.0)
MCHC: 34.2 g/dL (ref 30.0–36.0)
MCV: 96.4 fL (ref 80.0–100.0)
Platelets: 174 10*3/uL (ref 150–400)
RBC: 4.13 MIL/uL — ABNORMAL LOW (ref 4.22–5.81)
RDW: 12.9 % (ref 11.5–15.5)
WBC: 13.4 10*3/uL — ABNORMAL HIGH (ref 4.0–10.5)
nRBC: 0 % (ref 0.0–0.2)

## 2022-06-24 LAB — BASIC METABOLIC PANEL
Anion gap: 9 (ref 5–15)
BUN: 20 mg/dL (ref 6–20)
CO2: 28 mmol/L (ref 22–32)
Calcium: 9.1 mg/dL (ref 8.9–10.3)
Chloride: 99 mmol/L (ref 98–111)
Creatinine, Ser: 1.12 mg/dL (ref 0.61–1.24)
GFR, Estimated: >60 mL/min (ref >60)
Glucose, Bld: 161 mg/dL — ABNORMAL HIGH (ref 70–99)
Potassium: 4.5 mmol/L (ref 3.5–5.1)
Sodium: 136 mmol/L (ref 135–145)

## 2022-06-24 LAB — GLUCOSE, CAPILLARY
Glucose-Capillary: 183 mg/dL — ABNORMAL HIGH (ref 70–99)
Glucose-Capillary: 186 mg/dL — ABNORMAL HIGH (ref 70–99)
Glucose-Capillary: 188 mg/dL — ABNORMAL HIGH (ref 70–99)
Glucose-Capillary: 160 mg/dL — ABNORMAL HIGH (ref 70–99)
Glucose-Capillary: 351 mg/dL — ABNORMAL HIGH (ref 70–99)

## 2022-06-24 LAB — HEPARIN LEVEL (UNFRACTIONATED)
Heparin Unfractionated: 0.46 IU/mL (ref 0.30–0.70)
Heparin Unfractionated: 0.51 IU/mL (ref 0.30–0.70)

## 2022-06-24 NOTE — Plan of Care (Signed)

## 2022-06-24 NOTE — Progress Notes (Signed)
   Patient Name: Ian Gross Date of Encounter: 06/24/2022 [place] Cardiologist: Nelva Bush, MD   Interval Summary   Mouth pain. No chest pain or SOB.   Vital Signs   Vitals:   06/23/22 1703 06/23/22 1948 06/23/22 2333 06/24/22 0312  BP: 135/67 (!) 133/55 105/63 115/66  Pulse: 77 70 61 73  Resp: 15 15 16 19   Temp: 97.8 F (36.6 C) 98.5 F (36.9 C) 97.7 F (36.5 C) 98.1 F (36.7 C)  TempSrc: Oral Oral Oral Oral  SpO2: 99% 96% 97% 97%  Weight:      Height:        Intake/Output Summary (Last 24 hours) at 06/24/2022 0802 Last data filed at 06/24/2022 0449 Gross per 24 hour  Intake 702.08 ml  Output 2 ml  Net 700.08 ml      06/23/2022    2:21 PM 06/19/2022   11:04 AM 06/15/2022    5:45 PM  Last 3 Weights  Weight (lbs) 135 lb 135 lb 2.3 oz 135 lb 2.3 oz  Weight (kg) 61.236 kg 61.3 kg 61.3 kg      Telemetry/ECG    NSR, SB - Personally Reviewed  Physical Exam   GEN: No  acute distress.   Neck: No  JVD Cardiac: RRR, systolic murmur at the apex, 2/6 diastolic murmurs, rubs, or gallops.  Respiratory: Clear   to auscultation bilaterally. GI: Soft, nontender, non-distended, normal bowel sounds  MS:  No edema; No deformity.   Assessment & Plan    Chest pain:  Anterior STEMI 06/15/22.     Awaiting CABG.    Very high grade proximal LAD stenosis.  Heparin restarted after multiple dental extractions.  Now status post 7 teeth removed.   CM:  EF is 40 %.  Avoid beta blockers secondary to bradycardia.  BP has been low.  I will start a low dose of ARB as his BP will likley allow.    AI:   Plan is for AVR CABG.  Planned for Monday.   IDDM:  Status post pancreatectomy.   Increased SSI.  Continue current coverage.   Chronic pain:  Continue suboxone .    Risk reduction:    Continue Lipitor.    Hyponatremia:    Improved  Abnormal ABIs:  Left moderate disease.  Continue risk reduction.    For questions or updates, please contact [place] Please consult [email or web address] for contact info under        Signed, Minus Breeding, MD

## 2022-06-24 NOTE — Anesthesia Postprocedure Evaluation (Signed)
Anesthesia Post Note  Patient: Ian Gross  Procedure(s) Performed: DENTAL RESTORATION/EXTRACTION WITH POSSIBLE X-RAY     Patient location during evaluation: PACU Anesthesia Type: General Level of consciousness: awake and alert Pain management: pain level controlled Vital Signs Assessment: post-procedure vital signs reviewed and stable Respiratory status: spontaneous breathing, nonlabored ventilation and respiratory function stable Cardiovascular status: blood pressure returned to baseline Postop Assessment: no apparent nausea or vomiting Anesthetic complications: no   No notable events documented.              Marthenia Rolling

## 2022-06-24 NOTE — Progress Notes (Signed)
ANTICOAGULATION CONSULT NOTE  Pharmacy Consult for heparin Indication: chest pain/ACS  No Known Allergies  Patient Measurements: Height: 6' (182.9 cm) Weight: 61.2 kg (135 lb) IBW/kg (Calculated) : 77.6 Heparin Dosing Weight: 61kg  Vital Signs: Temp: 98.5 F (36.9 C) (03/28 1948) Temp Source: Oral (03/28 1948) BP: 112/63 (03/28 1948) Pulse Rate: 64 (03/28 1948)  Labs: Recent Labs    06/22/22 0013 06/23/22 0020 06/24/22 0004 06/24/22 1324 06/24/22 2122  HGB 13.5 14.3 13.6  --   --   HCT 39.7 42.8 39.8  --   --   PLT 174 157 174  --   --   HEPARINUNFRC 0.34 0.31  --  0.46 0.51  CREATININE 1.20  --  1.12  --   --      Estimated Creatinine Clearance: 69.1 mL/min (by C-G formula based on SCr of 1.12 mg/dL).    Assessment: Pt s/p cath which showed complete ostial/proximal LAD disease - CVTS consult for AVR CABG, heparin resumed after TR band removal. Also with moderate AI + poor dentition.   PM update - confirmatory heparin remains therapeutic. CBC WNL. No bleed issues reported.   Goal of Therapy:  Heparin level 0.3-0.7 units/ml Monitor platelets by anticoagulation protocol: Yes   Plan:  Continue heparin infusion at 1200 units/hr Monitor daily heparin level and CBC, s/sx bleeding AVR CABG tentatively planned for 06/28/22 with Dr. Rudene Re, PharmD, BCPS Please check AMION for all [place] contact numbers Clinical Pharmacist 06/24/2022 10:13 PM

## 2022-06-24 NOTE — Progress Notes (Signed)
[place] for heparin Indication: chest pain/ACS  No Known Allergies  Patient Measurements: Height: 6' (182.9 cm) Weight: 61.2 kg (135 lb) IBW/kg (Calculated) : 77.6 Heparin Dosing Weight: 61kg  Vital Signs: Temp: 97.9 F (36.6 C) (03/28 1117) Temp Source: Oral (03/28 1117) BP: 96/60 (03/28 1117) Pulse Rate: 67 (03/28 1117)  Labs: Recent Labs    06/22/22 0013 06/23/22 0020 06/24/22 0004 06/24/22 1324  HGB 13.5 14.3 13.6  --   HCT 39.7 42.8 39.8  --   PLT 174 157 174  --   HEPARINUNFRC 0.34 0.31  --  0.46  CREATININE 1.20  --  1.12  --      Estimated Creatinine Clearance: 69.1 mL/min (by C-G formula based on SCr of 1.12 mg/dL).    Assessment: Pt s/p cath which showed complete ostial/proximal LAD disease - PCI would be high risk. CVTS consult for AVR CABG, heparin resumed after TR band removal.  Also with moderate AI + poor dentition.   3/28 update: Heparin level therapeutic at 0.46 Current heparin infusion rate: 1200 units/hr  Hgb 13.6 and Plt 174 remain stable No s/sx of bleeding per RN report   Goal of Therapy:  Heparin level 0.3-0.7 units/ml Monitor platelets by anticoagulation protocol: Yes   Plan:  Continue heparin infusion at 1200 units/hr Heparin level in 8 hours AVR CABG tentatively planned for 06/28/22 with Dr. Ronnald Collum, PharmD, BCPS Clinical Pharmacist 06/24/2022 3:01 PM  Contact: [phone] after 3 PM  "Be curious, not judgmental..." -Jamal Maes

## 2022-06-24 NOTE — Progress Notes (Signed)
2000 patient alert and orientated complains of oral pain from tooth extraction PRN medications given patient on room air ambulating in room with no assist. Heparin going at 58ml hr

## 2022-06-25 LAB — GLUCOSE, CAPILLARY
Glucose-Capillary: 220 mg/dL — ABNORMAL HIGH (ref 70–99)
Glucose-Capillary: 137 mg/dL — ABNORMAL HIGH (ref 70–99)
Glucose-Capillary: 155 mg/dL — ABNORMAL HIGH (ref 70–99)
Glucose-Capillary: 118 mg/dL — ABNORMAL HIGH (ref 70–99)
Glucose-Capillary: 295 mg/dL — ABNORMAL HIGH (ref 70–99)

## 2022-06-25 LAB — CBC
HCT: 38.9 % — ABNORMAL LOW (ref 39.0–52.0)
Hemoglobin: 13.1 g/dL (ref 13.0–17.0)
MCH: 32.8 pg (ref 26.0–34.0)
MCHC: 33.7 g/dL (ref 30.0–36.0)
MCV: 97.5 fL (ref 80.0–100.0)
Platelets: 160 10*3/uL (ref 150–400)
RBC: 3.99 MIL/uL — ABNORMAL LOW (ref 4.22–5.81)
RDW: 13.1 % (ref 11.5–15.5)
WBC: 13 10*3/uL — ABNORMAL HIGH (ref 4.0–10.5)
nRBC: 0 % (ref 0.0–0.2)

## 2022-06-25 LAB — HEPARIN LEVEL (UNFRACTIONATED): Heparin Unfractionated: 0.51 IU/mL (ref 0.30–0.70)

## 2022-06-25 MED ORDER — CEFAZOLIN SODIUM-DEXTROSE 2-4 GM/100ML-% IV SOLN
2.0000 g | INTRAVENOUS | Status: DC
  Filled 2022-06-25: qty 100

## 2022-06-25 MED ORDER — PLASMA-LYTE A IV SOLN
INTRAVENOUS | Status: DC
  Filled 2022-06-25: qty 2.5

## 2022-06-25 MED ORDER — INSULIN REGULAR(HUMAN) IN NACL 100-0.9 UT/100ML-% IV SOLN
INTRAVENOUS | Status: AC
  Administered 2022-06-28: 3.4 [IU]/h via INTRAVENOUS
  Filled 2022-06-25: qty 100

## 2022-06-25 MED ORDER — LOSARTAN POTASSIUM 25 MG PO TABS
12.5000 mg | ORAL_TABLET | Freq: Every day | ORAL | Status: DC
  Administered 2022-06-25 – 2022-06-27 (×3): 12.5 mg via ORAL
  Filled 2022-06-25 (×3): qty 1

## 2022-06-25 MED ORDER — TRANEXAMIC ACID (OHS) BOLUS VIA INFUSION
15.0000 mg/kg | INTRAVENOUS | Status: AC
  Administered 2022-06-28: 918 mg via INTRAVENOUS
  Filled 2022-06-25: qty 918

## 2022-06-25 MED ORDER — VANCOMYCIN HCL 1250 MG/250ML IV SOLN
1250.0000 mg | INTRAVENOUS | Status: AC
  Administered 2022-06-28: 1250 mg via INTRAVENOUS
  Filled 2022-06-25: qty 250

## 2022-06-25 MED ORDER — INSULIN GLARGINE-YFGN 100 UNIT/ML ~~LOC~~ SOLN
10.0000 [IU] | Freq: Every day | SUBCUTANEOUS | Status: DC
  Administered 2022-06-25 – 2022-06-27 (×3): 10 [IU] via SUBCUTANEOUS
  Filled 2022-06-25 (×4): qty 0.1

## 2022-06-25 MED ORDER — MILRINONE LACTATE IN DEXTROSE 20-5 MG/100ML-% IV SOLN
0.3000 ug/kg/min | INTRAVENOUS | Status: DC
  Filled 2022-06-25: qty 100

## 2022-06-25 MED ORDER — HEPARIN 30,000 UNITS/1000 ML (OHS) CELLSAVER SOLUTION
Status: DC
  Filled 2022-06-25: qty 1000

## 2022-06-25 MED ORDER — TRANEXAMIC ACID (OHS) PUMP PRIME SOLUTION
2.0000 mg/kg | INTRAVENOUS | Status: DC
  Filled 2022-06-25: qty 1.22

## 2022-06-25 MED ORDER — NITROGLYCERIN IN D5W 200-5 MCG/ML-% IV SOLN
2.0000 ug/min | INTRAVENOUS | Status: DC
  Filled 2022-06-25: qty 250

## 2022-06-25 MED ORDER — EPINEPHRINE HCL 5 MG/250ML IV SOLN IN NS
0.0000 ug/min | INTRAVENOUS | Status: DC
  Filled 2022-06-25: qty 250

## 2022-06-25 MED ORDER — MORPHINE SULFATE (PF) 2 MG/ML IV SOLN
2.0000 mg | INTRAVENOUS | Status: AC | PRN
  Administered 2022-06-25 – 2022-06-27 (×2): 2 mg via INTRAVENOUS
  Filled 2022-06-25 (×2): qty 1

## 2022-06-25 MED ORDER — TRANEXAMIC ACID 1000 MG/10ML IV SOLN
1.5000 mg/kg/h | INTRAVENOUS | Status: AC
  Administered 2022-06-28: 1.5 mg/kg/h via INTRAVENOUS
  Filled 2022-06-25: qty 25

## 2022-06-25 MED ORDER — NOREPINEPHRINE 4 MG/250ML-% IV SOLN
0.0000 ug/min | INTRAVENOUS | Status: DC
  Filled 2022-06-25: qty 250

## 2022-06-25 MED ORDER — MANNITOL 20 % IV SOLN
INTRAVENOUS | Status: DC
  Filled 2022-06-25: qty 13

## 2022-06-25 MED ORDER — CEFAZOLIN SODIUM-DEXTROSE 2-4 GM/100ML-% IV SOLN
2.0000 g | INTRAVENOUS | Status: AC
  Administered 2022-06-28 (×2): 2 g via INTRAVENOUS
  Filled 2022-06-25: qty 100

## 2022-06-25 MED ORDER — DEXMEDETOMIDINE HCL IN NACL 400 MCG/100ML IV SOLN
0.1000 ug/kg/h | INTRAVENOUS | Status: AC
  Administered 2022-06-28: .7 ug/kg/h via INTRAVENOUS
  Filled 2022-06-25: qty 100

## 2022-06-25 MED ORDER — PHENYLEPHRINE HCL-NACL 20-0.9 MG/250ML-% IV SOLN
30.0000 ug/min | INTRAVENOUS | Status: DC
  Filled 2022-06-25: qty 250

## 2022-06-25 MED ORDER — POLYETHYLENE GLYCOL 3350 17 G PO PACK
17.0000 g | PACK | Freq: Every day | ORAL | Status: DC
  Administered 2022-06-27: 17 g via ORAL
  Filled 2022-06-25 (×3): qty 1

## 2022-06-25 MED ORDER — POTASSIUM CHLORIDE 2 MEQ/ML IV SOLN
80.0000 meq | INTRAVENOUS | Status: DC
  Filled 2022-06-25: qty 40

## 2022-06-25 NOTE — Progress Notes (Signed)
Inpatient Diabetes Program Recommendations  AACE/ADA: New Consensus Statement on Inpatient Glycemic Control (2015)  Target Ranges:  Prepandial:   less than 140 mg/dL      Peak postprandial:   less than 180 mg/dL (1-2 hours)      Critically ill patients:  140 - 180 mg/dL   Lab Results  Component Value Date   GLUCAP 220 (H) 06/25/2022   HGBA1C 7.6 (H) 06/16/2022    Review of Glycemic Control  Latest Reference Range & Units 06/24/22 06:14 06/24/22 11:23 06/24/22 16:15 06/24/22 21:13 06/25/22 06:07  Glucose-Capillary 70 - 99 mg/dL 186 (H) 188 (H) 160 (H) 351 (H) 220 (H)   Diabetes history: DM - Hx. Of pancreatectomy Outpatient Diabetes medications:  Lantus 10 units q HS, Humalog 4-9 units tid with meals  Current orders for Inpatient glycemic control:  Novolog 0-15 units tid with  meals and HS Semglee 6 units q HS Novolog 4 units tid with meals Inpatient Diabetes Program Recommendations:    Please consider increasing Semglee to 10 units q HS.   Thanks,  Adah Perl, RN, BC-ADM Inpatient Diabetes Coordinator Pager [phone]  (8a-5p)

## 2022-06-25 NOTE — Progress Notes (Signed)
ANTICOAGULATION CONSULT NOTE- follow-up  Pharmacy Consult for heparin Indication: chest pain/ACS  No Known Allergies  Patient Measurements: Height: 6' (182.9 cm) Weight: 61.2 kg (135 lb) IBW/kg (Calculated) : 77.6 Heparin Dosing Weight: 61kg  Vital Signs: Temp: 97.6 F (36.4 C) (03/29 0422) Temp Source: Oral (03/29 0422) BP: 120/62 (03/29 0422) Pulse Rate: 60 (03/29 0422)  Labs: Recent Labs    06/23/22 0020 06/24/22 0004 06/24/22 1324 06/24/22 2122 06/25/22 0547  HGB 14.3 13.6  --   --  13.1  HCT 42.8 39.8  --   --  38.9*  PLT 157 174  --   --  160  HEPARINUNFRC 0.31  --  0.46 0.51 0.51  CREATININE  --  1.12  --   --   --      Estimated Creatinine Clearance: 69.1 mL/min (by C-G formula based on SCr of 1.12 mg/dL).    Assessment: Pt s/p cath which showed complete ostial/proximal LAD disease - CVTS consult for AVR CABG, heparin resumed after TR band removal. Also with moderate AI + poor dentition.   PM update - confirmatory heparin remains therapeutic. CBC WNL. No bleed issues reported.  3/29 AM update: HL: 0.51 No signs of bleeding No issues with the infusion   Goal of Therapy:  Heparin level 0.3-0.7 units/ml Monitor platelets by anticoagulation protocol: Yes   Plan:  Continue heparin infusion at 1200 units/hr Monitor daily heparin level and CBC, s/sx bleeding AVR CABG tentatively planned for 06/28/22 with Dr. Ronnald Collum, PharmD, BCPS Clinical Pharmacist 06/25/2022 7:16 AM  Contact: [phone] after 3 PM  "Be curious, not judgmental..." -Jamal Maes

## 2022-06-25 NOTE — Plan of Care (Signed)
  Problem: Health Behavior/Discharge Planning: Goal: Ability to safely manage health-related needs after discharge will improve Outcome: Progressing   Problem: Education: Goal: Understanding of cardiac disease, CV risk reduction, and recovery process will improve Outcome: Progressing Goal: Individualized Educational Video(s) Outcome: Progressing   Problem: Activity: Goal: Ability to tolerate increased activity will improve Outcome: Progressing   Problem: Health Behavior/Discharge Planning: Goal: Ability to safely manage health-related needs after discharge will improve Outcome: Progressing   Problem: Education: Goal: Ability to describe self-care measures that may prevent or decrease complications (Diabetes Survival Skills Education) will improve Outcome: Progressing   Problem: Fluid Volume: Goal: Ability to maintain a balanced intake and output will improve Outcome: Progressing   Problem: Health Behavior/Discharge Planning: Goal: Ability to identify and utilize available resources and services will improve Outcome: Progressing Goal: Ability to manage health-related needs will improve Outcome: Progressing

## 2022-06-25 NOTE — Progress Notes (Signed)
Pt declined ambulation at this time. Encouraged him to walk on his own. Reviewed preop ed but instructed not to use IS till Sunday to allow his mouth healing time.  [place], ACSM-CEP 06/25/2022 2:37 PM

## 2022-06-25 NOTE — Progress Notes (Addendum)
   Patient Name: Ian Gross Date of Encounter: 06/25/2022 [place] Cardiologist: Nelva Bush, MD   Interval Summary   No chest pain.  No SOB. His mouth is very sore from teeth extraction  Vital Signs   Vitals:   06/24/22 1948 06/24/22 2318 06/25/22 0422 06/25/22 0741  BP: 112/63 110/61 120/62 121/65  Pulse: 64 60 60 61  Resp: 17 17 16 14   Temp: 98.5 F (36.9 C) 98.2 F (36.8 C) 97.6 F (36.4 C) 98.2 F (36.8 C)  TempSrc: Oral Oral Oral Oral  SpO2:  98%  97%  Weight:      Height:        Intake/Output Summary (Last 24 hours) at 06/25/2022 0803 Last data filed at 06/25/2022 0515 Gross per 24 hour  Intake 538.91 ml  Output --  Net 538.91 ml      06/23/2022    2:21 PM 06/19/2022   11:04 AM 06/15/2022    5:45 PM  Last 3 Weights  Weight (lbs) 135 lb 135 lb 2.3 oz 135 lb 2.3 oz  Weight (kg) 61.236 kg 61.3 kg 61.3 kg      Telemetry/ECG    NSR, SB - Personally Reviewed  Physical Exam   GEN: No  acute distress.   Neck: No  JVD Cardiac: RRR, soft apical systolic murmur, soft left 3rd intercostal space diastolic murmurs, rubs, or gallops.  Respiratory: Clear   to auscultation bilaterally. GI: Soft, nontender, non-distended, normal bowel sounds  MS:  no edema; No deformity.   Assessment & Plan    Chest pain:  Anterior STEMI 06/15/22.     Awaiting CABG.    High grade proximal LAD stenosis.  Status post dental extractions.  Continue Heparin.  CABG on Monday.    CM:  EF is 40 %.  Avoid increasing beta blockers secondary to bradycardia.  He has tolerated a very low dose.   I will start a low dose of ARB as his BP will likley allow.    AI:   Plan is for AVR CABG.  Planned for Monday.   IDDM:  Status post pancreatectomy.   Increased SSI.  Increased Semglee per Diabetes Coordinator suggestions.   Chronic pain:  Continue suboxone .    He is having break through pain and needs more control for the short term.  OK to use a couple of doses of IV morphine  with suboxone per pharmacy and I will order.   Risk reduction:    Continue Lipitor.    Abnormal ABIs:  Left moderate disease.  Continue risk reduction.    For questions or updates, please contact [place] Please consult [email or web address] for contact info under        Signed, Minus Breeding, MD

## 2022-06-26 DIAGNOSIS — I213 ST elevation (STEMI) myocardial infarction of unspecified site: Secondary | ICD-10-CM

## 2022-06-26 LAB — CBC
HCT: 43.8 % (ref 39.0–52.0)
Hemoglobin: 14.7 g/dL (ref 13.0–17.0)
MCH: 32.7 pg (ref 26.0–34.0)
MCHC: 33.6 g/dL (ref 30.0–36.0)
MCV: 97.3 fL (ref 80.0–100.0)
Platelets: 187 K/uL (ref 150–400)
RBC: 4.5 MIL/uL (ref 4.22–5.81)
RDW: 13.1 % (ref 11.5–15.5)
WBC: 11 K/uL — ABNORMAL HIGH (ref 4.0–10.5)
nRBC: 0 % (ref 0.0–0.2)

## 2022-06-26 LAB — GLUCOSE, CAPILLARY
Glucose-Capillary: 222 mg/dL — ABNORMAL HIGH (ref 70–99)
Glucose-Capillary: 334 mg/dL — ABNORMAL HIGH (ref 70–99)
Glucose-Capillary: 284 mg/dL — ABNORMAL HIGH (ref 70–99)
Glucose-Capillary: 241 mg/dL — ABNORMAL HIGH (ref 70–99)

## 2022-06-26 NOTE — Progress Notes (Signed)
Rounding Note    Patient Name: Ian Gross Date of Encounter: 06/26/2022  [place] Cardiologist: Nelva Bush, MD   Subjective   Some mouth soreness from toot extractions. No chest pains, no SOB  Inpatient Medications    Scheduled Meds:  aspirin EC  81 mg Oral Daily   atorvastatin  80 mg Oral Daily   buprenorphine-naloxone  1 tablet Sublingual Daily   [START ON 06/28/2022] epinephrine  0-10 mcg/min Intravenous To OR   [START ON 06/28/2022] heparin sodium (porcine) 2,500 Units, papaverine 30 mg in electrolyte-A (PLASMALYTE-A PH 7.4) 500 mL irrigation   Irrigation To OR   insulin aspart  0-15 Units Subcutaneous TID WC   insulin aspart  0-5 Units Subcutaneous QHS   insulin aspart  4 Units Subcutaneous TID WC   insulin glargine-yfgn  10 Units Subcutaneous QHS   [START ON 06/28/2022] insulin   Intravenous To OR   [START ON 06/28/2022] [place] Blood Cardioplegia vial (lidocaine/magnesium/mannitol 0.26g-4g-6.4g)   Intracoronary To OR   lipase/protease/amylase  72,000 Units Oral TID   losartan  12.5 mg Oral Daily   metoprolol succinate  12.5 mg Oral Daily   nicotine  14 mg Transdermal Daily   [START ON 06/28/2022] phenylephrine  30-200 mcg/min Intravenous To OR   polyethylene glycol  17 g Oral Daily   [START ON 06/28/2022] potassium chloride  80 mEq Other To OR   [START ON 06/28/2022] tranexamic acid  15 mg/kg Intravenous To OR   [START ON 06/28/2022] tranexamic acid  2 mg/kg Intracatheter To OR   Continuous Infusions:  [START ON 06/28/2022]  ceFAZolin (ANCEF) IV     [START ON 06/28/2022]  ceFAZolin (ANCEF) IV     [START ON 06/28/2022] dexmedetomidine     [START ON 06/28/2022] heparin 30,000 units/NS 1000 mL solution for CELLSAVER     [START ON 06/28/2022] milrinone     [START ON 06/28/2022] nitroGLYCERIN     [START ON 06/28/2022] norepinephrine     [START ON 06/28/2022] tranexamic acid (CYKLOKAPRON) 2,500 mg in sodium chloride 0.9 % 250 mL (10 mg/mL) infusion     [START ON  06/28/2022] vancomycin     PRN Meds: acetaminophen, morphine injection, nitroGLYCERIN, ondansetron (ZOFRAN) IV, mouth rinse, oxyCODONE-acetaminophen, white petrolatum   Vital Signs    Vitals:   06/25/22 1938 06/25/22 2343 06/26/22 0337 06/26/22 0727  BP: 113/76 127/71 123/64 129/68  Pulse: 71 64 (!) 56 (!) 57  Resp: 14 10 13 16   Temp: 98.4 F (36.9 C) 98.3 F (36.8 C) 97.7 F (36.5 C) 98.2 F (36.8 C)  TempSrc: Oral Oral Oral Oral  SpO2: 99% 99% 99% 97%  Weight:      Height:        Intake/Output Summary (Last 24 hours) at 06/26/2022 0736 Last data filed at 06/25/2022 0900 Gross per 24 hour  Intake 120 ml  Output --  Net 120 ml      06/23/2022    2:21 PM 06/19/2022   11:04 AM 06/15/2022    5:45 PM  Last 3 Weights  Weight (lbs) 135 lb 135 lb 2.3 oz 135 lb 2.3 oz  Weight (kg) 61.236 kg 61.3 kg 61.3 kg      Telemetry    SR and sinus brady - Personally Reviewed  ECG    N/a - Personally Reviewed  Physical Exam   GEN: No acute distress.   Neck: No JVD Cardiac: RRR, no murmurs, rubs, or gallops.  Respiratory: Clear to auscultation bilaterally. GI: Soft, nontender,  non-distended  MS: No edema; No deformity. Neuro:  Nonfocal  Psych: Normal affect   Labs    High Sensitivity Troponin:   Recent Labs  Lab 06/15/22 1553 06/15/22 1803  TROPONINIHS 21* 101*     Chemistry Recent Labs  Lab 06/20/22 0021 06/22/22 0013 06/24/22 0004  NA 132* 133* 136  K 4.5 3.9 4.5  CL 99 97* 99  CO2 27 30 28   GLUCOSE 281* 171* 161*  BUN 17 20 20   CREATININE 0.99 1.20 1.12  CALCIUM 8.3* 8.5* 9.1  GFRNONAA >60 >60 >60  ANIONGAP 6 6 9     Lipids No results for input(s): "CHOL", "TRIG", "HDL", "LABVLDL", "LDLCALC", "CHOLHDL" in the last 168 hours.  Hematology Recent Labs  Lab 06/24/22 0004 06/25/22 0547 06/26/22 0014  WBC 13.4* 13.0* 11.0*  RBC 4.13* 3.99* 4.50  HGB 13.6 13.1 14.7  HCT 39.8 38.9* 43.8  MCV 96.4 97.5 97.3  MCH 32.9 32.8 32.7  MCHC 34.2 33.7 33.6  RDW  12.9 13.1 13.1  PLT 174 160 187   Thyroid No results for input(s): "TSH", "FREET4" in the last 168 hours.  BNPNo results for input(s): "BNP", "PROBNP" in the last 168 hours.  DDimer No results for input(s): "DDIMER" in the last 168 hours.   Radiology    No results found.  Cardiac Studies     Patient Profile     Ian Gross is a 47 y.o. 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 Cherylann Banas.   Assessment & Plan    1.CAD/STEMI - anterior STEMI 06/15/22 - cath with ostial 90% LAD, D1 75% - patient was hemodymically stable and chest pain free at time of cath. Anatomy considered high risk for PCI, plans are for CABG.  -05/2022 echo: LVEF 40%, mid to apical anterosetpal and inferoseptal akinesis, anterior hypokinesis.  - Medical therapy with ASA 81, atorva 80, losartan 12.5, toprol 12.5mg .   Plan for CABG Monday  2. Acute HFrEF -05/2022 echo: LVEF 40%, mid to apical anterosetpal and inferoseptal akinesis, anterior hypokinesis.  losartan 12.5, toprol 12.5mg . Avoid over aggressive titration in pre CABG period, continue current regimen. Has also had some bradycardia, limit beta blocker dosing.    3.Aortic regurgitation - mod to severe by echo - plan for AVR with CABG  4. IDDM - Status post pancreatectomy.   Increased SSI.  Increased Semglee per Diabetes Coordinator suggestions.   5. Chronic pain - he is on suboxone - mouth pain from extractions, overall contorlled with prn morphine.   For questions or updates, please contact [place] Please consult [email or web address] for contact info under        Signed, Carlyle Dolly, MD  06/26/2022, 7:36 AM

## 2022-06-26 NOTE — Progress Notes (Signed)
Pt received in bed. Attempted ambulation but pt declines. Pt voices his mouth is still bothering him and he states he is able to walk around on his own. RN notified. CR will continue to follow s/p OHS next week.   Lesly Rubenstein MS, ACSM-CEP, CCRP 9:29 - 9:39

## 2022-06-27 ENCOUNTER — Inpatient Hospital Stay (HOSPITAL_COMMUNITY): Payer: Medicare Other

## 2022-06-27 LAB — BASIC METABOLIC PANEL
Anion gap: 7 (ref 5–15)
BUN: 19 mg/dL (ref 6–20)
CO2: 29 mmol/L (ref 22–32)
Calcium: 8.7 mg/dL — ABNORMAL LOW (ref 8.9–10.3)
Chloride: 98 mmol/L (ref 98–111)
Creatinine, Ser: 1.05 mg/dL (ref 0.61–1.24)
GFR, Estimated: >60 mL/min (ref >60)
Glucose, Bld: 159 mg/dL — ABNORMAL HIGH (ref 70–99)
Potassium: 4.1 mmol/L (ref 3.5–5.1)
Sodium: 134 mmol/L — ABNORMAL LOW (ref 135–145)

## 2022-06-27 LAB — COMPREHENSIVE METABOLIC PANEL
ALT: 44 U/L (ref 0–44)
AST: 45 U/L — ABNORMAL HIGH (ref 15–41)
Albumin: 3.4 g/dL — ABNORMAL LOW (ref 3.5–5.0)
Alkaline Phosphatase: 103 U/L (ref 38–126)
Anion gap: 8 (ref 5–15)
BUN: 19 mg/dL (ref 6–20)
CO2: 29 mmol/L (ref 22–32)
Calcium: 8.7 mg/dL — ABNORMAL LOW (ref 8.9–10.3)
Chloride: 94 mmol/L — ABNORMAL LOW (ref 98–111)
Creatinine, Ser: 1.18 mg/dL (ref 0.61–1.24)
GFR, Estimated: >60 mL/min (ref >60)
Glucose, Bld: 295 mg/dL — ABNORMAL HIGH (ref 70–99)
Potassium: 4.3 mmol/L (ref 3.5–5.1)
Sodium: 131 mmol/L — ABNORMAL LOW (ref 135–145)
Total Bilirubin: 0.9 mg/dL (ref 0.3–1.2)
Total Protein: 6 g/dL — ABNORMAL LOW (ref 6.5–8.1)

## 2022-06-27 LAB — BLOOD GAS, ARTERIAL
Acid-Base Excess: 4.2 mmol/L — ABNORMAL HIGH (ref 0.0–2.0)
Bicarbonate: 29.2 mmol/L — ABNORMAL HIGH (ref 20.0–28.0)
Drawn by: 28340
O2 Saturation: 99.4 %
Patient temperature: 37
pCO2 arterial: 44 mmHg (ref 32–48)
pH, Arterial: 7.43 (ref 7.35–7.45)
pO2, Arterial: 89 mmHg (ref 83–108)

## 2022-06-27 LAB — URINALYSIS, ROUTINE W REFLEX MICROSCOPIC
Bacteria, UA: NONE SEEN
Bilirubin Urine: NEGATIVE
Glucose, UA: >=500 mg/dL — AB
Hgb urine dipstick: NEGATIVE
Ketones, ur: NEGATIVE mg/dL
Leukocytes,Ua: NEGATIVE
Nitrite: NEGATIVE
Protein, ur: NEGATIVE mg/dL
Specific Gravity, Urine: 1.033 — ABNORMAL HIGH (ref 1.005–1.030)
pH: 5 (ref 5.0–8.0)

## 2022-06-27 LAB — GLUCOSE, CAPILLARY
Glucose-Capillary: 163 mg/dL — ABNORMAL HIGH (ref 70–99)
Glucose-Capillary: 168 mg/dL — ABNORMAL HIGH (ref 70–99)
Glucose-Capillary: 208 mg/dL — ABNORMAL HIGH (ref 70–99)
Glucose-Capillary: 272 mg/dL — ABNORMAL HIGH (ref 70–99)

## 2022-06-27 LAB — PROTIME-INR
INR: 1.1 (ref 0.8–1.2)
Prothrombin Time: 13.6 seconds (ref 11.4–15.2)

## 2022-06-27 LAB — SARS CORONAVIRUS 2 BY RT PCR: SARS Coronavirus 2 by RT PCR: NEGATIVE

## 2022-06-27 LAB — PREPARE RBC (CROSSMATCH)

## 2022-06-27 LAB — APTT: aPTT: 27 seconds (ref 24–36)

## 2022-06-27 MED ORDER — METOPROLOL TARTRATE 12.5 MG HALF TABLET
12.5000 mg | ORAL_TABLET | Freq: Once | ORAL | Status: AC
  Administered 2022-06-28: 12.5 mg via ORAL
  Filled 2022-06-27: qty 1

## 2022-06-27 MED ORDER — CHLORHEXIDINE GLUCONATE CLOTH 2 % EX PADS
6.0000 | MEDICATED_PAD | Freq: Once | CUTANEOUS | Status: AC
  Administered 2022-06-27: 6 via TOPICAL

## 2022-06-27 MED ORDER — TEMAZEPAM 15 MG PO CAPS
15.0000 mg | ORAL_CAPSULE | Freq: Once | ORAL | Status: AC | PRN
  Administered 2022-06-27: 15 mg via ORAL
  Filled 2022-06-27: qty 1

## 2022-06-27 MED ORDER — CHLORHEXIDINE GLUCONATE 0.12 % MT SOLN
15.0000 mL | Freq: Once | OROMUCOSAL | Status: AC
  Administered 2022-06-28: 15 mL via OROMUCOSAL

## 2022-06-27 MED ORDER — BISACODYL 5 MG PO TBEC: 5.0000 mg | DELAYED_RELEASE_TABLET | Freq: Once | ORAL | Status: DC

## 2022-06-27 MED ORDER — CHLORHEXIDINE GLUCONATE CLOTH 2 % EX PADS
6.0000 | MEDICATED_PAD | Freq: Once | CUTANEOUS | Status: AC
  Administered 2022-06-28: 6 via TOPICAL

## 2022-06-27 NOTE — Anesthesia Preprocedure Evaluation (Signed)
Anesthesia Evaluation  Patient identified by MRN, date of birth, ID band Patient awake    Reviewed: Allergy & Precautions, NPO status , Patient's Chart, lab work & pertinent test results, Unable to perform ROS - Chart review only  History of Anesthesia Complications Negative for: history of anesthetic complications  Airway Mallampati: II  TM Distance: >3 FB Neck ROM: Full    Dental  (+) Poor Dentition, Dental Advisory Given,    Pulmonary Current Smoker and Patient abstained from smoking.   Pulmonary exam normal        Cardiovascular + CAD and + Past MI  + Valvular Problems/Murmurs  Rhythm:Regular Rate:Normal + Diastolic murmurs Echo [id number]  1. Left ventricular ejection fraction, by estimation, is 40%. The left ventricle has mild to moderately decreased function. The left ventricle demonstrates regional wall motion abnormalities with mid to apical anteroseptal and inferoseptal akinesis and anterior hypokinesis. The left ventricular internal cavity size was mildly dilated. Left ventricular diastolic parameters were normal.   2. Right ventricular systolic function is normal. The right ventricular size is normal. Tricuspid regurgitation signal is inadequate for assessing PA pressure.   3. The mitral valve is normal in structure. No evidence of mitral valve regurgitation. No evidence of mitral stenosis.   4. The aortic valve is probaby tricuspid though not visualized ideally. There is mild calcification of the aortic valve. Aortic valve regurgitation is moderate to severe. There was no holodiastolic flow reversal noted in the descending thoracic aorta. No aortic stenosis is present.   5. The inferior vena cava is normal in size with <50% respiratory variability, suggesting right atrial pressure of 8 mmHg.    '24 Cath  1.Severe complex ostial/proximal LAD involving moderate-caliber D1 that is difficult to visualize due to vessel overlap.   Lesion appears to be an acute plaque rupture with up to 90% stenosis involving the LAD and 75% stenosis involving D1 leading to the patient's NSTEMI.  LMCA, ramus intermedius, LCx, and RCA are without significant disease. 2.Mildly reduced left ventricular systolic function with mild mid and apical anterior and apical inferior hypokinesis (LVEF 45-50%). 3.Normal left ventricular filling pressure (LVEDP 6 mmHg).    Neuro/Psych    GI/Hepatic   Endo/Other  diabetes, Type 2, Insulin Dependent    Renal/GU      Musculoskeletal   Abdominal   Peds  Hematology  S/p splenectomy    Anesthesia Other Findings   Reproductive/Obstetrics                             Anesthesia Physical Anesthesia Plan  ASA: 4  Anesthesia Plan: General   Post-op Pain Management: Tylenol PO (pre-op)*   Induction: Intravenous  PONV Risk Score and Plan: 2 and Treatment may vary due to age or medical condition, Ondansetron and Midazolam  Airway Management Planned: Oral ETT  Additional Equipment: Arterial line, CVP, 3D TEE and Ultrasound Guidance Line Placement  Intra-op Plan:   Post-operative Plan: Post-operative intubation/ventilation  Informed Consent: I have reviewed the patients History and Physical, chart, labs and discussed the procedure including the risks, benefits and alternatives for the proposed anesthesia with the patient or authorized representative who has indicated his/her understanding and acceptance.     Dental advisory given  Plan Discussed with: Anesthesiologist, CRNA and Surgeon  Anesthesia Plan Comments:         Anesthesia Quick Evaluation

## 2022-06-27 NOTE — Progress Notes (Signed)
Rounding Note    Patient Name: Dezjuan Copus Date of Encounter: 06/27/2022  [place] Cardiologist: Nelva Bush, MD   Subjective   No complaints. Mouth soreness much improved  Inpatient Medications    Scheduled Meds:  aspirin EC  81 mg Oral Daily   atorvastatin  80 mg Oral Daily   buprenorphine-naloxone  1 tablet Sublingual Daily   [START ON 06/28/2022] epinephrine  0-10 mcg/min Intravenous To OR   [START ON 06/28/2022] heparin sodium (porcine) 2,500 Units, papaverine 30 mg in electrolyte-A (PLASMALYTE-A PH 7.4) 500 mL irrigation   Irrigation To OR   insulin aspart  0-15 Units Subcutaneous TID WC   insulin aspart  0-5 Units Subcutaneous QHS   insulin aspart  4 Units Subcutaneous TID WC   insulin glargine-yfgn  10 Units Subcutaneous QHS   [START ON 06/28/2022] insulin   Intravenous To OR   [START ON 06/28/2022] [facility] Blood Cardioplegia vial (lidocaine/magnesium/mannitol 0.26g-4g-6.4g)   Intracoronary To OR   lipase/protease/amylase  72,000 Units Oral TID   losartan  12.5 mg Oral Daily   metoprolol succinate  12.5 mg Oral Daily   nicotine  14 mg Transdermal Daily   [START ON 06/28/2022] phenylephrine  30-200 mcg/min Intravenous To OR   polyethylene glycol  17 g Oral Daily   [START ON 06/28/2022] potassium chloride  80 mEq Other To OR   [START ON 06/28/2022] tranexamic acid  15 mg/kg Intravenous To OR   [START ON 06/28/2022] tranexamic acid  2 mg/kg Intracatheter To OR   Continuous Infusions:  [START ON 06/28/2022]  ceFAZolin (ANCEF) IV     [START ON 06/28/2022]  ceFAZolin (ANCEF) IV     [START ON 06/28/2022] dexmedetomidine     [START ON 06/28/2022] heparin 30,000 units/NS 1000 mL solution for CELLSAVER     [START ON 06/28/2022] milrinone     [START ON 06/28/2022] nitroGLYCERIN     [START ON 06/28/2022] norepinephrine     [START ON 06/28/2022] tranexamic acid (CYKLOKAPRON) 2,500 mg in sodium chloride 0.9 % 250 mL (10 mg/mL) infusion     [START ON 06/28/2022] vancomycin     PRN  Meds: acetaminophen, morphine injection, nitroGLYCERIN, ondansetron (ZOFRAN) IV, mouth rinse, oxyCODONE-acetaminophen, white petrolatum   Vital Signs    Vitals:   06/26/22 1608 06/26/22 1929 06/27/22 0338 06/27/22 0719  BP: 108/67 99/71 139/75 (!) 141/70  Pulse: 65 69 83 60  Resp: 11 13 18 17   Temp: 98.8 F (37.1 C) 98.6 F (37 C) 98.1 F (36.7 C) 98.5 F (36.9 C)  TempSrc: Oral Oral Oral Oral  SpO2: 97% 97% 98% 97%  Weight:      Height:        Intake/Output Summary (Last 24 hours) at 06/27/2022 0732 Last data filed at 06/26/2022 1200 Gross per 24 hour  Intake 480 ml  Output --  Net 480 ml      06/23/2022    2:21 PM 06/19/2022   11:04 AM 06/15/2022    5:45 PM  Last 3 Weights  Weight (lbs) 135 lb 135 lb 2.3 oz 135 lb 2.3 oz  Weight (kg) 61.236 kg 61.3 kg 61.3 kg      Telemetry    SR - Personally Reviewed  ECG    N/a - Personally Reviewed  Physical Exam   GEN: No acute distress.   Neck: No JVD Cardiac: RRR, no murmurs, rubs, or gallops.  Respiratory: Clear to auscultation bilaterally. GI: Soft, nontender, non-distended  MS: No edema; No deformity. Neuro:  Nonfocal  Psych: Normal affect   Labs    High Sensitivity Troponin:   Recent Labs  Lab 06/15/22 1553 06/15/22 1803  TROPONINIHS 21* 101*     Chemistry Recent Labs  Lab 06/22/22 0013 06/24/22 0004 06/27/22 0010  NA 133* 136 134*  K 3.9 4.5 4.1  CL 97* 99 98  CO2 30 28 29   GLUCOSE 171* 161* 159*  BUN 20 20 19   CREATININE 1.20 1.12 1.05  CALCIUM 8.5* 9.1 8.7*  GFRNONAA >60 >60 >60  ANIONGAP 6 9 7     Lipids No results for input(s): "CHOL", "TRIG", "HDL", "LABVLDL", "LDLCALC", "CHOLHDL" in the last 168 hours.  Hematology Recent Labs  Lab 06/24/22 0004 06/25/22 0547 06/26/22 0014  WBC 13.4* 13.0* 11.0*  RBC 4.13* 3.99* 4.50  HGB 13.6 13.1 14.7  HCT 39.8 38.9* 43.8  MCV 96.4 97.5 97.3  MCH 32.9 32.8 32.7  MCHC 34.2 33.7 33.6  RDW 12.9 13.1 13.1  PLT 174 160 187   Thyroid No  results for input(s): "TSH", "FREET4" in the last 168 hours.  BNPNo results for input(s): "BNP", "PROBNP" in the last 168 hours.  DDimer No results for input(s): "DDIMER" in the last 168 hours.   Radiology    No results found.  Cardiac Studies     Patient Profile     Mabel Arns is a 47 y.o. 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 Cherylann Banas.   Assessment & Plan    1.CAD/STEMI - anterior STEMI 06/15/22 - cath with ostial 90% LAD, D1 75% - patient was hemodymically stable and chest pain free at time of cath. Anatomy considered high risk for PCI, plans are for CABG.  -05/2022 echo: LVEF 40%, mid to apical anterosetpal and inferoseptal akinesis, anterior hypokinesis.  - Medical therapy with ASA 81, atorva 80, losartan 12.5, toprol 12.5mg .   - no issues overnight  Plan for CABG Monday   2. Acute HFrEF -05/2022 echo: LVEF 40%, mid to apical anterosetpal and inferoseptal akinesis, anterior hypokinesis.  losartan 12.5, toprol 12.5mg . Avoid over aggressive titration in pre CABG period, continue current regimen. Has also had some bradycardia, limit beta blocker dosing.  - continue current meds     3.Aortic regurgitation - mod to severe by echo - plan for AVR with CABG   4. IDDM - Status post pancreatectomy.   Increased SSI.  Increased Semglee per Diabetes Coordinator suggestions.    5. Chronic pain - he is on suboxone - mouth pain from extractions, overall contorlled with prn morphine.  For questions or updates, please contact [place] Please consult [email or web address] for contact info under        Signed, Carlyle Dolly, MD  06/27/2022, 7:32 AM

## 2022-06-28 ENCOUNTER — Inpatient Hospital Stay (HOSPITAL_COMMUNITY): Payer: Medicare Other | Admitting: Registered Nurse

## 2022-06-28 ENCOUNTER — Inpatient Hospital Stay (HOSPITAL_COMMUNITY)
Admission: EM | Disposition: A | Discharge status: Home Health | Payer: Self-pay | Source: Other Acute Inpatient Hospital | Attending: Thoracic Surgery (Cardiothoracic Vascular Surgery)

## 2022-06-28 ENCOUNTER — Inpatient Hospital Stay (HOSPITAL_COMMUNITY): Payer: Medicare Other

## 2022-06-28 ENCOUNTER — Other Ambulatory Visit: Payer: Self-pay | Admitting: Cardiology

## 2022-06-28 ENCOUNTER — Encounter (HOSPITAL_COMMUNITY): Payer: Self-pay | Admitting: Thoracic Surgery (Cardiothoracic Vascular Surgery)

## 2022-06-28 ENCOUNTER — Other Ambulatory Visit: Payer: Self-pay

## 2022-06-28 DIAGNOSIS — I2510 Atherosclerotic heart disease of native coronary artery without angina pectoris: Secondary | ICD-10-CM

## 2022-06-28 DIAGNOSIS — I252 Old myocardial infarction: Secondary | ICD-10-CM

## 2022-06-28 DIAGNOSIS — D62 Acute posthemorrhagic anemia: Secondary | ICD-10-CM

## 2022-06-28 DIAGNOSIS — Z9911 Dependence on respirator [ventilator] status: Secondary | ICD-10-CM

## 2022-06-28 DIAGNOSIS — I351 Nonrheumatic aortic (valve) insufficiency: Secondary | ICD-10-CM | POA: Diagnosis not present

## 2022-06-28 DIAGNOSIS — F17210 Nicotine dependence, cigarettes, uncomplicated: Secondary | ICD-10-CM | POA: Diagnosis not present

## 2022-06-28 DIAGNOSIS — I350 Nonrheumatic aortic (valve) stenosis: Secondary | ICD-10-CM | POA: Diagnosis not present

## 2022-06-28 DIAGNOSIS — I214 Non-ST elevation (NSTEMI) myocardial infarction: Secondary | ICD-10-CM | POA: Diagnosis not present

## 2022-06-28 DIAGNOSIS — Z79891 Long term (current) use of opiate analgesic: Secondary | ICD-10-CM | POA: Diagnosis not present

## 2022-06-28 HISTORY — PX: CORONARY ARTERY BYPASS GRAFT: SHX141

## 2022-06-28 HISTORY — PX: TEE WITHOUT CARDIOVERSION: SHX5443

## 2022-06-28 HISTORY — PX: AORTIC VALVE REPLACEMENT: SHX41

## 2022-06-28 LAB — POCT I-STAT 7, (LYTES, BLD GAS, ICA,H+H)
Acid-Base Excess: 2 mmol/L (ref 0.0–2.0)
Acid-Base Excess: 2 mmol/L (ref 0.0–2.0)
Acid-Base Excess: 0 mmol/L (ref 0.0–2.0)
Acid-Base Excess: 1 mmol/L (ref 0.0–2.0)
Acid-Base Excess: 0 mmol/L (ref 0.0–2.0)
Acid-base deficit: 1 mmol/L (ref 0.0–2.0)
Acid-base deficit: 3 mmol/L — ABNORMAL HIGH (ref 0.0–2.0)
Acid-base deficit: 2 mmol/L (ref 0.0–2.0)
Bicarbonate: 28.1 mmol/L — ABNORMAL HIGH (ref 20.0–28.0)
Bicarbonate: 24.4 mmol/L (ref 20.0–28.0)
Bicarbonate: 25.8 mmol/L (ref 20.0–28.0)
Bicarbonate: 24.4 mmol/L (ref 20.0–28.0)
Bicarbonate: 26 mmol/L (ref 20.0–28.0)
Bicarbonate: 23.2 mmol/L (ref 20.0–28.0)
Bicarbonate: 23 mmol/L (ref 20.0–28.0)
Bicarbonate: 23 mmol/L (ref 20.0–28.0)
Calcium, Ion: 1.2 mmol/L (ref 1.15–1.40)
Calcium, Ion: 0.91 mmol/L — ABNORMAL LOW (ref 1.15–1.40)
Calcium, Ion: 1.06 mmol/L — ABNORMAL LOW (ref 1.15–1.40)
Calcium, Ion: 1.27 mmol/L (ref 1.15–1.40)
Calcium, Ion: 1.41 mmol/L — ABNORMAL HIGH (ref 1.15–1.40)
Calcium, Ion: 1.25 mmol/L (ref 1.15–1.40)
Calcium, Ion: 1.23 mmol/L (ref 1.15–1.40)
Calcium, Ion: 1.21 mmol/L (ref 1.15–1.40)
HCT: 40 % (ref 39.0–52.0)
HCT: 24 % — ABNORMAL LOW (ref 39.0–52.0)
HCT: 25 % — ABNORMAL LOW (ref 39.0–52.0)
HCT: 21 % — ABNORMAL LOW (ref 39.0–52.0)
HCT: 22 % — ABNORMAL LOW (ref 39.0–52.0)
HCT: 23 % — ABNORMAL LOW (ref 39.0–52.0)
HCT: 23 % — ABNORMAL LOW (ref 39.0–52.0)
HCT: 23 % — ABNORMAL LOW (ref 39.0–52.0)
Hemoglobin: 13.6 g/dL (ref 13.0–17.0)
Hemoglobin: 8.2 g/dL — ABNORMAL LOW (ref 13.0–17.0)
Hemoglobin: 8.5 g/dL — ABNORMAL LOW (ref 13.0–17.0)
Hemoglobin: 7.1 g/dL — ABNORMAL LOW (ref 13.0–17.0)
Hemoglobin: 7.5 g/dL — ABNORMAL LOW (ref 13.0–17.0)
Hemoglobin: 7.8 g/dL — ABNORMAL LOW (ref 13.0–17.0)
Hemoglobin: 7.8 g/dL — ABNORMAL LOW (ref 13.0–17.0)
Hemoglobin: 7.8 g/dL — ABNORMAL LOW (ref 13.0–17.0)
O2 Saturation: 100 %
O2 Saturation: 100 %
O2 Saturation: 100 %
O2 Saturation: 100 %
O2 Saturation: 100 %
O2 Saturation: 99 %
O2 Saturation: 97 %
O2 Saturation: 99 %
Patient temperature: 36.5
Patient temperature: 37.1
Patient temperature: 37.9
Potassium: 3.9 mmol/L (ref 3.5–5.1)
Potassium: 4 mmol/L (ref 3.5–5.1)
Potassium: 3.9 mmol/L (ref 3.5–5.1)
Potassium: 3.8 mmol/L (ref 3.5–5.1)
Potassium: 4.3 mmol/L (ref 3.5–5.1)
Potassium: 3.7 mmol/L (ref 3.5–5.1)
Potassium: 4.2 mmol/L (ref 3.5–5.1)
Potassium: 4.7 mmol/L (ref 3.5–5.1)
Sodium: 134 mmol/L — ABNORMAL LOW (ref 135–145)
Sodium: 135 mmol/L (ref 135–145)
Sodium: 135 mmol/L (ref 135–145)
Sodium: 135 mmol/L (ref 135–145)
Sodium: 139 mmol/L (ref 135–145)
Sodium: 137 mmol/L (ref 135–145)
Sodium: 136 mmol/L (ref 135–145)
Sodium: 136 mmol/L (ref 135–145)
TCO2: 30 mmol/L (ref 22–32)
TCO2: 26 mmol/L (ref 22–32)
TCO2: 27 mmol/L (ref 22–32)
TCO2: 26 mmol/L (ref 22–32)
TCO2: 27 mmol/L (ref 22–32)
TCO2: 24 mmol/L (ref 22–32)
TCO2: 24 mmol/L (ref 22–32)
TCO2: 24 mmol/L (ref 22–32)
pCO2 arterial: 47.8 mmHg (ref 32–48)
pCO2 arterial: 44.9 mmHg (ref 32–48)
pCO2 arterial: 37.6 mmHg (ref 32–48)
pCO2 arterial: 36.3 mmHg (ref 32–48)
pCO2 arterial: 41.6 mmHg (ref 32–48)
pCO2 arterial: 30.7 mmHg — ABNORMAL LOW (ref 32–48)
pCO2 arterial: 45.9 mmHg (ref 32–48)
pCO2 arterial: 41.2 mmHg (ref 32–48)
pH, Arterial: 7.377 (ref 7.35–7.45)
pH, Arterial: 7.343 — ABNORMAL LOW (ref 7.35–7.45)
pH, Arterial: 7.444 (ref 7.35–7.45)
pH, Arterial: 7.405 (ref 7.35–7.45)
pH, Arterial: 7.436 (ref 7.35–7.45)
pH, Arterial: 7.483 — ABNORMAL HIGH (ref 7.35–7.45)
pH, Arterial: 7.308 — ABNORMAL LOW (ref 7.35–7.45)
pH, Arterial: 7.358 (ref 7.35–7.45)
pO2, Arterial: 338 mmHg — ABNORMAL HIGH (ref 83–108)
pO2, Arterial: 410 mmHg — ABNORMAL HIGH (ref 83–108)
pO2, Arterial: 270 mmHg — ABNORMAL HIGH (ref 83–108)
pO2, Arterial: 240 mmHg — ABNORMAL HIGH (ref 83–108)
pO2, Arterial: 286 mmHg — ABNORMAL HIGH (ref 83–108)
pO2, Arterial: 138 mmHg — ABNORMAL HIGH (ref 83–108)
pO2, Arterial: 99 mmHg (ref 83–108)
pO2, Arterial: 126 mmHg — ABNORMAL HIGH (ref 83–108)

## 2022-06-28 LAB — CBC
HCT: 26.4 % — ABNORMAL LOW (ref 39.0–52.0)
HCT: 22.3 % — ABNORMAL LOW (ref 39.0–52.0)
Hemoglobin: 9.2 g/dL — ABNORMAL LOW (ref 13.0–17.0)
Hemoglobin: 8 g/dL — ABNORMAL LOW (ref 13.0–17.0)
MCH: 33.7 pg (ref 26.0–34.0)
MCH: 34.5 pg — ABNORMAL HIGH (ref 26.0–34.0)
MCHC: 34.8 g/dL (ref 30.0–36.0)
MCHC: 35.9 g/dL (ref 30.0–36.0)
MCV: 96.7 fL (ref 80.0–100.0)
MCV: 96.1 fL (ref 80.0–100.0)
Platelets: 62 10*3/uL — ABNORMAL LOW (ref 150–400)
Platelets: 87 10*3/uL — ABNORMAL LOW (ref 150–400)
RBC: 2.73 MIL/uL — ABNORMAL LOW (ref 4.22–5.81)
RBC: 2.32 MIL/uL — ABNORMAL LOW (ref 4.22–5.81)
RDW: 13 % (ref 11.5–15.5)
RDW: 13.1 % (ref 11.5–15.5)
WBC: 18.4 10*3/uL — ABNORMAL HIGH (ref 4.0–10.5)
WBC: 18 10*3/uL — ABNORMAL HIGH (ref 4.0–10.5)
nRBC: 0 % (ref 0.0–0.2)
nRBC: 0 % (ref 0.0–0.2)

## 2022-06-28 LAB — POCT I-STAT EG7
Acid-Base Excess: 0 mmol/L (ref 0.0–2.0)
Bicarbonate: 25.8 mmol/L (ref 20.0–28.0)
Calcium, Ion: 1.01 mmol/L — ABNORMAL LOW (ref 1.15–1.40)
HCT: 24 % — ABNORMAL LOW (ref 39.0–52.0)
Hemoglobin: 8.2 g/dL — ABNORMAL LOW (ref 13.0–17.0)
O2 Saturation: 80 %
Potassium: 3.5 mmol/L (ref 3.5–5.1)
Sodium: 136 mmol/L (ref 135–145)
TCO2: 27 mmol/L (ref 22–32)
pCO2, Ven: 45.3 mmHg (ref 44–60)
pH, Ven: 7.363 (ref 7.25–7.43)
pO2, Ven: 46 mmHg — ABNORMAL HIGH (ref 32–45)

## 2022-06-28 LAB — BASIC METABOLIC PANEL
Anion gap: 6 (ref 5–15)
BUN: 14 mg/dL (ref 6–20)
CO2: 24 mmol/L (ref 22–32)
Calcium: 7.8 mg/dL — ABNORMAL LOW (ref 8.9–10.3)
Chloride: 103 mmol/L (ref 98–111)
Creatinine, Ser: 0.93 mg/dL (ref 0.61–1.24)
GFR, Estimated: >60 mL/min (ref >60)
Glucose, Bld: 133 mg/dL — ABNORMAL HIGH (ref 70–99)
Potassium: 4.7 mmol/L (ref 3.5–5.1)
Sodium: 133 mmol/L — ABNORMAL LOW (ref 135–145)

## 2022-06-28 LAB — POCT I-STAT, CHEM 8
BUN: 19 mg/dL (ref 6–20)
BUN: 18 mg/dL (ref 6–20)
BUN: 17 mg/dL (ref 6–20)
BUN: 16 mg/dL (ref 6–20)
Calcium, Ion: 1.21 mmol/L (ref 1.15–1.40)
Calcium, Ion: 1.18 mmol/L (ref 1.15–1.40)
Calcium, Ion: 1.01 mmol/L — ABNORMAL LOW (ref 1.15–1.40)
Calcium, Ion: 1.03 mmol/L — ABNORMAL LOW (ref 1.15–1.40)
Chloride: 97 mmol/L — ABNORMAL LOW (ref 98–111)
Chloride: 99 mmol/L (ref 98–111)
Chloride: 97 mmol/L — ABNORMAL LOW (ref 98–111)
Chloride: 100 mmol/L (ref 98–111)
Creatinine, Ser: 0.9 mg/dL (ref 0.61–1.24)
Creatinine, Ser: 0.8 mg/dL (ref 0.61–1.24)
Creatinine, Ser: 0.7 mg/dL (ref 0.61–1.24)
Creatinine, Ser: 0.7 mg/dL (ref 0.61–1.24)
Glucose, Bld: 166 mg/dL — ABNORMAL HIGH (ref 70–99)
Glucose, Bld: 132 mg/dL — ABNORMAL HIGH (ref 70–99)
Glucose, Bld: 120 mg/dL — ABNORMAL HIGH (ref 70–99)
Glucose, Bld: 128 mg/dL — ABNORMAL HIGH (ref 70–99)
HCT: 41 % (ref 39.0–52.0)
HCT: 35 % — ABNORMAL LOW (ref 39.0–52.0)
HCT: 23 % — ABNORMAL LOW (ref 39.0–52.0)
HCT: 22 % — ABNORMAL LOW (ref 39.0–52.0)
Hemoglobin: 13.9 g/dL (ref 13.0–17.0)
Hemoglobin: 11.9 g/dL — ABNORMAL LOW (ref 13.0–17.0)
Hemoglobin: 7.8 g/dL — ABNORMAL LOW (ref 13.0–17.0)
Hemoglobin: 7.5 g/dL — ABNORMAL LOW (ref 13.0–17.0)
Potassium: 3.9 mmol/L (ref 3.5–5.1)
Potassium: 3.5 mmol/L (ref 3.5–5.1)
Potassium: 3.9 mmol/L (ref 3.5–5.1)
Potassium: 4.1 mmol/L (ref 3.5–5.1)
Sodium: 135 mmol/L (ref 135–145)
Sodium: 137 mmol/L (ref 135–145)
Sodium: 135 mmol/L (ref 135–145)
Sodium: 136 mmol/L (ref 135–145)
TCO2: 28 mmol/L (ref 22–32)
TCO2: 30 mmol/L (ref 22–32)
TCO2: 27 mmol/L (ref 22–32)
TCO2: 24 mmol/L (ref 22–32)

## 2022-06-28 LAB — APTT: aPTT: 33 seconds (ref 24–36)

## 2022-06-28 LAB — HEMOGLOBIN A1C
Hgb A1c MFr Bld: 7.9 % — ABNORMAL HIGH (ref 4.8–5.6)
Mean Plasma Glucose: 180 mg/dL

## 2022-06-28 LAB — ECHO INTRAOPERATIVE TEE
Height: 72 in
Weight: 1873.03 oz

## 2022-06-28 LAB — PROTIME-INR
INR: 1.7 — ABNORMAL HIGH (ref 0.8–1.2)
Prothrombin Time: 19.7 seconds — ABNORMAL HIGH (ref 11.4–15.2)

## 2022-06-28 LAB — GLUCOSE, CAPILLARY
Glucose-Capillary: 88 mg/dL (ref 70–99)
Glucose-Capillary: 108 mg/dL — ABNORMAL HIGH (ref 70–99)
Glucose-Capillary: 121 mg/dL — ABNORMAL HIGH (ref 70–99)
Glucose-Capillary: 110 mg/dL — ABNORMAL HIGH (ref 70–99)
Glucose-Capillary: 140 mg/dL — ABNORMAL HIGH (ref 70–99)
Glucose-Capillary: 163 mg/dL — ABNORMAL HIGH (ref 70–99)
Glucose-Capillary: 138 mg/dL — ABNORMAL HIGH (ref 70–99)
Glucose-Capillary: 130 mg/dL — ABNORMAL HIGH (ref 70–99)
Glucose-Capillary: 144 mg/dL — ABNORMAL HIGH (ref 70–99)
Glucose-Capillary: 129 mg/dL — ABNORMAL HIGH (ref 70–99)

## 2022-06-28 LAB — PLATELET COUNT: Platelets: 105 10*3/uL — ABNORMAL LOW (ref 150–400)

## 2022-06-28 LAB — HEMOGLOBIN AND HEMATOCRIT, BLOOD
HCT: 23.3 % — ABNORMAL LOW (ref 39.0–52.0)
Hemoglobin: 7.9 g/dL — ABNORMAL LOW (ref 13.0–17.0)

## 2022-06-28 LAB — FIBRINOGEN: Fibrinogen: 167 mg/dL — ABNORMAL LOW (ref 210–475)

## 2022-06-28 SURGERY — CORONARY ARTERY BYPASS GRAFTING (CABG)
Anesthesia: General | Site: Chest

## 2022-06-28 MED ORDER — MIDAZOLAM HCL (PF) 5 MG/ML IJ SOLN
INTRAMUSCULAR | Status: DC | PRN
  Administered 2022-06-28 (×2): 2 mg via INTRAVENOUS
  Administered 2022-06-28: 3 mg via INTRAVENOUS
  Administered 2022-06-28: 2 mg via INTRAVENOUS
  Administered 2022-06-28: 1 mg via INTRAVENOUS

## 2022-06-28 MED ORDER — PROPOFOL 10 MG/ML IV BOLUS
INTRAVENOUS | Status: AC
  Filled 2022-06-28: qty 20

## 2022-06-28 MED ORDER — SODIUM CHLORIDE 0.9 % IV SOLN: INTRAVENOUS | Status: DC | PRN

## 2022-06-28 MED ORDER — LACTATED RINGERS IV SOLN: INTRAVENOUS | Status: DC

## 2022-06-28 MED ORDER — PROTAMINE SULFATE 10 MG/ML IV SOLN
INTRAVENOUS | Status: DC | PRN
  Administered 2022-06-28: 50 mg via INTRAVENOUS
  Administered 2022-06-28: 190 mg via INTRAVENOUS

## 2022-06-28 MED ORDER — ONDANSETRON HCL 4 MG/2ML IJ SOLN
4.0000 mg | Freq: Four times a day (QID) | INTRAMUSCULAR | Status: DC | PRN
  Administered 2022-06-28 (×2): 4 mg via INTRAVENOUS
  Filled 2022-06-28 (×3): qty 2

## 2022-06-28 MED ORDER — MAGNESIUM SULFATE 4 GM/100ML IV SOLN
4.0000 g | Freq: Once | INTRAVENOUS | Status: AC
  Administered 2022-06-28: 4 g via INTRAVENOUS
  Filled 2022-06-28: qty 100

## 2022-06-28 MED ORDER — INSULIN REGULAR(HUMAN) IN NACL 100-0.9 UT/100ML-% IV SOLN: INTRAVENOUS | Status: AC

## 2022-06-28 MED ORDER — METOPROLOL TARTRATE 12.5 MG HALF TABLET
12.5000 mg | ORAL_TABLET | Freq: Two times a day (BID) | ORAL | Status: DC
  Administered 2022-06-29 (×2): 12.5 mg via ORAL
  Filled 2022-06-28 (×2): qty 1

## 2022-06-28 MED ORDER — ACETAMINOPHEN 650 MG RE SUPP
650.0000 mg | Freq: Once | RECTAL | Status: AC
  Administered 2022-06-28: 650 mg via RECTAL

## 2022-06-28 MED ORDER — SODIUM CHLORIDE (PF) 0.9 % IJ SOLN: OROMUCOSAL | Status: DC | PRN

## 2022-06-28 MED ORDER — SODIUM CHLORIDE 0.9% FLUSH
3.0000 mL | Freq: Two times a day (BID) | INTRAVENOUS | Status: DC
  Administered 2022-06-29 – 2022-07-02 (×4): 3 mL via INTRAVENOUS

## 2022-06-28 MED ORDER — SODIUM CHLORIDE 0.9 % IV SOLN: INTRAVENOUS | Status: DC

## 2022-06-28 MED ORDER — ACETAMINOPHEN 160 MG/5ML PO SOLN: 650.0000 mg | Freq: Once | ORAL | Status: AC

## 2022-06-28 MED ORDER — BUPRENORPHINE HCL-NALOXONE HCL 8-2 MG SL SUBL: 1.0000 | SUBLINGUAL_TABLET | Freq: Every day | SUBLINGUAL | Status: DC

## 2022-06-28 MED ORDER — HEPARIN SODIUM (PORCINE) 1000 UNIT/ML IJ SOLN
INTRAMUSCULAR | Status: AC
  Filled 2022-06-28: qty 1

## 2022-06-28 MED ORDER — VANCOMYCIN HCL IN DEXTROSE 1-5 GM/200ML-% IV SOLN
1000.0000 mg | Freq: Once | INTRAVENOUS | Status: AC
  Administered 2022-06-28: 1000 mg via INTRAVENOUS
  Filled 2022-06-28: qty 200

## 2022-06-28 MED ORDER — ACETAMINOPHEN 500 MG PO TABS
1000.0000 mg | ORAL_TABLET | Freq: Four times a day (QID) | ORAL | Status: AC
  Administered 2022-06-28 – 2022-07-03 (×18): 1000 mg via ORAL
  Filled 2022-06-28 (×19): qty 2

## 2022-06-28 MED ORDER — MIDAZOLAM HCL 2 MG/2ML IJ SOLN: 2.0000 mg | INTRAMUSCULAR | Status: DC | PRN

## 2022-06-28 MED ORDER — LACTATED RINGERS IV SOLN: INTRAVENOUS | Status: DC | PRN

## 2022-06-28 MED ORDER — SODIUM CHLORIDE (PF) 0.9 % IJ SOLN
INTRAMUSCULAR | Status: AC
  Filled 2022-06-28: qty 10

## 2022-06-28 MED ORDER — PHENYLEPHRINE 80 MCG/ML (10ML) SYRINGE FOR IV PUSH (FOR BLOOD PRESSURE SUPPORT)
PREFILLED_SYRINGE | INTRAVENOUS | Status: DC | PRN
  Administered 2022-06-28: 120 ug via INTRAVENOUS
  Administered 2022-06-28 (×2): 160 ug via INTRAVENOUS

## 2022-06-28 MED ORDER — BISACODYL 5 MG PO TBEC
10.0000 mg | DELAYED_RELEASE_TABLET | Freq: Every day | ORAL | Status: DC
  Administered 2022-06-29 – 2022-07-01 (×3): 10 mg via ORAL
  Filled 2022-06-28 (×5): qty 2

## 2022-06-28 MED ORDER — NITROGLYCERIN IN D5W 200-5 MCG/ML-% IV SOLN
0.0000 ug/min | INTRAVENOUS | Status: DC
  Administered 2022-06-28: 10 ug/min via INTRAVENOUS
  Filled 2022-06-28: qty 250

## 2022-06-28 MED ORDER — FENTANYL CITRATE (PF) 250 MCG/5ML IJ SOLN
INTRAMUSCULAR | Status: AC
  Filled 2022-06-28: qty 5

## 2022-06-28 MED ORDER — ACETAMINOPHEN 160 MG/5ML PO SOLN: 1000.0000 mg | Freq: Four times a day (QID) | ORAL | Status: AC

## 2022-06-28 MED ORDER — ESMOLOL HCL 100 MG/10ML IV SOLN
INTRAVENOUS | Status: AC
  Filled 2022-06-28: qty 20

## 2022-06-28 MED ORDER — SODIUM CHLORIDE 0.9 % IV SOLN: 250.0000 mL | INTRAVENOUS | Status: DC

## 2022-06-28 MED ORDER — ASPIRIN 81 MG PO CHEW: 324.0000 mg | CHEWABLE_TABLET | Freq: Every day | ORAL | Status: DC

## 2022-06-28 MED ORDER — HEPARIN SODIUM (PORCINE) 1000 UNIT/ML IJ SOLN
INTRAMUSCULAR | Status: DC | PRN
  Administered 2022-06-28: 19000 [IU] via INTRAVENOUS

## 2022-06-28 MED ORDER — FENTANYL CITRATE (PF) 250 MCG/5ML IJ SOLN
INTRAMUSCULAR | Status: DC | PRN
  Administered 2022-06-28: 300 ug via INTRAVENOUS
  Administered 2022-06-28 (×2): 100 ug via INTRAVENOUS
  Administered 2022-06-28: 50 ug via INTRAVENOUS
  Administered 2022-06-28: 100 ug via INTRAVENOUS
  Administered 2022-06-28: 250 ug via INTRAVENOUS
  Administered 2022-06-28: 50 ug via INTRAVENOUS
  Administered 2022-06-28 (×2): 100 ug via INTRAVENOUS
  Administered 2022-06-28 (×2): 50 ug via INTRAVENOUS

## 2022-06-28 MED ORDER — METOPROLOL TARTRATE 25 MG/10 ML ORAL SUSPENSION: 12.5000 mg | Freq: Two times a day (BID) | ORAL | Status: DC

## 2022-06-28 MED ORDER — MORPHINE SULFATE (PF) 2 MG/ML IV SOLN
1.0000 mg | INTRAVENOUS | Status: DC | PRN
  Administered 2022-06-28 (×2): 2 mg via INTRAVENOUS
  Administered 2022-06-28 (×4): 1 mg via INTRAVENOUS
  Administered 2022-06-28 – 2022-06-30 (×29): 4 mg via INTRAVENOUS
  Administered 2022-07-01 (×3): 2 mg via INTRAVENOUS
  Administered 2022-07-01 (×3): 4 mg via INTRAVENOUS
  Filled 2022-06-28 (×2): qty 2
  Filled 2022-06-28 (×2): qty 1
  Filled 2022-06-28 (×6): qty 2
  Filled 2022-06-28: qty 1
  Filled 2022-06-28: qty 2
  Filled 2022-06-28: qty 1
  Filled 2022-06-28 (×10): qty 2
  Filled 2022-06-28: qty 1
  Filled 2022-06-28 (×6): qty 2
  Filled 2022-06-28: qty 1
  Filled 2022-06-28 (×3): qty 2
  Filled 2022-06-28: qty 1
  Filled 2022-06-28 (×2): qty 2
  Filled 2022-06-28 (×2): qty 1
  Filled 2022-06-28: qty 2
  Filled 2022-06-28: qty 1
  Filled 2022-06-28: qty 2
  Filled 2022-06-28: qty 1

## 2022-06-28 MED ORDER — ROCURONIUM BROMIDE 10 MG/ML (PF) SYRINGE
PREFILLED_SYRINGE | INTRAVENOUS | Status: AC
  Filled 2022-06-28: qty 10

## 2022-06-28 MED ORDER — ALBUMIN HUMAN 5 % IV SOLN
250.0000 mL | INTRAVENOUS | Status: AC | PRN
  Administered 2022-06-28 (×3): 12.5 g via INTRAVENOUS
  Filled 2022-06-28 (×2): qty 250

## 2022-06-28 MED ORDER — FAMOTIDINE IN NACL 20-0.9 MG/50ML-% IV SOLN
20.0000 mg | Freq: Two times a day (BID) | INTRAVENOUS | Status: AC
  Administered 2022-06-28 (×2): 20 mg via INTRAVENOUS
  Filled 2022-06-28 (×2): qty 50

## 2022-06-28 MED ORDER — DOBUTAMINE IN D5W 4-5 MG/ML-% IV SOLN: 0.0000 ug/kg/min | INTRAVENOUS | Status: DC

## 2022-06-28 MED ORDER — CEFAZOLIN SODIUM-DEXTROSE 2-4 GM/100ML-% IV SOLN
2.0000 g | Freq: Three times a day (TID) | INTRAVENOUS | Status: AC
  Administered 2022-06-28 – 2022-06-30 (×6): 2 g via INTRAVENOUS
  Filled 2022-06-28 (×6): qty 100

## 2022-06-28 MED ORDER — VASOPRESSIN 20 UNIT/ML IV SOLN
INTRAVENOUS | Status: AC
  Filled 2022-06-28: qty 1

## 2022-06-28 MED ORDER — ARTIFICIAL TEARS OPHTHALMIC OINT
TOPICAL_OINTMENT | OPHTHALMIC | Status: DC | PRN
  Administered 2022-06-28: 1 via OPHTHALMIC

## 2022-06-28 MED ORDER — OXYCODONE HCL 5 MG PO TABS
5.0000 mg | ORAL_TABLET | ORAL | Status: DC | PRN
  Administered 2022-06-28 – 2022-06-30 (×11): 10 mg via ORAL
  Administered 2022-07-01: 5 mg via ORAL
  Administered 2022-07-01 – 2022-07-02 (×2): 10 mg via ORAL
  Administered 2022-07-02: 5 mg via ORAL
  Administered 2022-07-02 (×4): 10 mg via ORAL
  Administered 2022-07-02: 5 mg via ORAL
  Administered 2022-07-03 (×4): 10 mg via ORAL
  Administered 2022-07-03: 5 mg via ORAL
  Administered 2022-07-04 (×2): 10 mg via ORAL
  Filled 2022-06-28 (×28): qty 2

## 2022-06-28 MED ORDER — BISACODYL 10 MG RE SUPP
10.0000 mg | Freq: Every day | RECTAL | Status: DC
  Filled 2022-06-28: qty 1

## 2022-06-28 MED ORDER — CHLORHEXIDINE GLUCONATE CLOTH 2 % EX PADS
6.0000 | MEDICATED_PAD | Freq: Every day | CUTANEOUS | Status: DC
  Administered 2022-06-28 – 2022-07-01 (×4): 6 via TOPICAL

## 2022-06-28 MED ORDER — MIDAZOLAM HCL (PF) 10 MG/2ML IJ SOLN
INTRAMUSCULAR | Status: AC
  Filled 2022-06-28: qty 2

## 2022-06-28 MED ORDER — 0.9 % SODIUM CHLORIDE (POUR BTL) OPTIME
TOPICAL | Status: DC | PRN
  Administered 2022-06-28: 5000 mL

## 2022-06-28 MED ORDER — PANTOPRAZOLE SODIUM 40 MG PO TBEC
40.0000 mg | DELAYED_RELEASE_TABLET | Freq: Every day | ORAL | Status: DC
  Administered 2022-06-30 – 2022-07-05 (×6): 40 mg via ORAL
  Filled 2022-06-28 (×6): qty 1

## 2022-06-28 MED ORDER — ALBUMIN HUMAN 5 % IV SOLN: INTRAVENOUS | Status: DC | PRN

## 2022-06-28 MED ORDER — DEXTROSE 50 % IV SOLN
0.0000 mL | INTRAVENOUS | Status: DC | PRN
  Administered 2022-06-29: 15 mL via INTRAVENOUS
  Filled 2022-06-28: qty 50

## 2022-06-28 MED ORDER — ESMOLOL HCL 100 MG/10ML IV SOLN
INTRAVENOUS | Status: DC | PRN
  Administered 2022-06-28: 40 mg via INTRAVENOUS
  Administered 2022-06-28 (×2): 10 mg via INTRAVENOUS

## 2022-06-28 MED ORDER — VASOPRESSIN 20 UNIT/ML IV SOLN
INTRAVENOUS | Status: DC | PRN
  Administered 2022-06-28 (×2): 1 [IU] via INTRAVENOUS

## 2022-06-28 MED ORDER — POTASSIUM CHLORIDE 10 MEQ/50ML IV SOLN
10.0000 meq | INTRAVENOUS | Status: AC
  Administered 2022-06-28 (×3): 10 meq via INTRAVENOUS

## 2022-06-28 MED ORDER — PHENYLEPHRINE 80 MCG/ML (10ML) SYRINGE FOR IV PUSH (FOR BLOOD PRESSURE SUPPORT)
PREFILLED_SYRINGE | INTRAVENOUS | Status: AC
  Filled 2022-06-28: qty 10

## 2022-06-28 MED ORDER — PROTAMINE SULFATE 10 MG/ML IV SOLN
INTRAVENOUS | Status: AC
  Filled 2022-06-28: qty 25

## 2022-06-28 MED ORDER — SODIUM CHLORIDE 0.9% FLUSH: 3.0000 mL | INTRAVENOUS | Status: DC | PRN

## 2022-06-28 MED ORDER — ROCURONIUM BROMIDE 10 MG/ML (PF) SYRINGE
PREFILLED_SYRINGE | INTRAVENOUS | Status: DC | PRN
  Administered 2022-06-28: 30 mg via INTRAVENOUS
  Administered 2022-06-28: 100 mg via INTRAVENOUS
  Administered 2022-06-28: 40 mg via INTRAVENOUS
  Administered 2022-06-28: 60 mg via INTRAVENOUS
  Administered 2022-06-28: 30 mg via INTRAVENOUS
  Administered 2022-06-28: 40 mg via INTRAVENOUS

## 2022-06-28 MED ORDER — DEXMEDETOMIDINE HCL IN NACL 400 MCG/100ML IV SOLN: 0.0000 ug/kg/h | INTRAVENOUS | Status: DC

## 2022-06-28 MED ORDER — CHLORHEXIDINE GLUCONATE 0.12 % MT SOLN
15.0000 mL | OROMUCOSAL | Status: AC
  Administered 2022-06-28: 15 mL via OROMUCOSAL
  Filled 2022-06-28: qty 15

## 2022-06-28 MED ORDER — LACTATED RINGERS IV SOLN: 500.0000 mL | Freq: Once | INTRAVENOUS | Status: DC | PRN

## 2022-06-28 MED ORDER — PHENYLEPHRINE HCL-NACL 20-0.9 MG/250ML-% IV SOLN
INTRAVENOUS | Status: DC | PRN
  Administered 2022-06-28: 50 ug/min via INTRAVENOUS

## 2022-06-28 MED ORDER — ASPIRIN 325 MG PO TBEC
325.0000 mg | DELAYED_RELEASE_TABLET | Freq: Every day | ORAL | Status: DC
  Administered 2022-06-29: 325 mg via ORAL
  Filled 2022-06-28: qty 1

## 2022-06-28 MED ORDER — DOCUSATE SODIUM 100 MG PO CAPS
200.0000 mg | ORAL_CAPSULE | Freq: Every day | ORAL | Status: DC
  Administered 2022-06-29 – 2022-07-03 (×4): 200 mg via ORAL
  Filled 2022-06-28 (×6): qty 2

## 2022-06-28 MED ORDER — PLASMA-LYTE A IV SOLN
INTRAVENOUS | Status: DC | PRN
  Administered 2022-06-28: 500 mL

## 2022-06-28 MED ORDER — TRAMADOL HCL 50 MG PO TABS
50.0000 mg | ORAL_TABLET | ORAL | Status: DC | PRN
  Administered 2022-06-28 – 2022-07-02 (×12): 100 mg via ORAL
  Filled 2022-06-28 (×12): qty 2

## 2022-06-28 MED ORDER — METOPROLOL TARTRATE 5 MG/5ML IV SOLN: 2.5000 mg | INTRAVENOUS | Status: DC | PRN

## 2022-06-28 MED ORDER — PHENYLEPHRINE HCL-NACL 20-0.9 MG/250ML-% IV SOLN
0.0000 ug/min | INTRAVENOUS | Status: DC
  Administered 2022-06-28: 50 ug/min via INTRAVENOUS
  Filled 2022-06-28: qty 250

## 2022-06-28 MED ORDER — SODIUM CHLORIDE 0.45 % IV SOLN: INTRAVENOUS | Status: DC | PRN

## 2022-06-28 MED ORDER — PROPOFOL 10 MG/ML IV BOLUS
INTRAVENOUS | Status: DC | PRN
  Administered 2022-06-28 (×2): 20 mg via INTRAVENOUS
  Administered 2022-06-28: 100 mg via INTRAVENOUS
  Administered 2022-06-28: 30 mg via INTRAVENOUS

## 2022-06-28 SURGICAL SUPPLY — 117 items
ADAPTER CARDIO PERF ANTE/RETRO (ADAPTER) ×2 IMPLANT
ADH SKN CLS APL DERMABOND .7 (GAUZE/BANDAGES/DRESSINGS) ×2
ADPR PRFSN 84XANTGRD RTRGD (ADAPTER) ×2
BAG DECANTER FOR FLEXI CONT (MISCELLANEOUS) ×4 IMPLANT
BLADE CLIPPER SURG (BLADE) ×4 IMPLANT
BLADE STERNUM SYSTEM 6 (BLADE) ×4 IMPLANT
BLADE SURG 11 STRL SS (BLADE) IMPLANT
BLADE SURG 15 STRL LF DISP TIS (BLADE) ×2 IMPLANT
BLADE SURG 15 STRL SS (BLADE)
BNDG CMPR 5X62 HK CLSR LF (GAUZE/BANDAGES/DRESSINGS) ×2
BNDG ELASTIC 4X5.8 VLCR STR LF (GAUZE/BANDAGES/DRESSINGS) ×2 IMPLANT
BNDG ELASTIC 6INX 5YD STR LF (GAUZE/BANDAGES/DRESSINGS) IMPLANT
BNDG ELASTIC 6X5.8 VLCR STR LF (GAUZE/BANDAGES/DRESSINGS) ×2 IMPLANT
BNDG GAUZE DERMACEA FLUFF 4 (GAUZE/BANDAGES/DRESSINGS) ×2 IMPLANT
BNDG GZE DERMACEA 4 6PLY (GAUZE/BANDAGES/DRESSINGS) ×2
CABLE SURGICAL S-101-97-12 (CABLE) ×2 IMPLANT
CANISTER SUCT 3000ML PPV (MISCELLANEOUS) ×4 IMPLANT
CANNULA GUNDRY RCSP 15FR (MISCELLANEOUS) ×2 IMPLANT
CANNULA MC2 2 STG 29/37 NON-V (CANNULA) ×2 IMPLANT
CANNULA NON VENT 18FR 12 (CANNULA) IMPLANT
CANNULA NON VENT 20FR 12 (CANNULA) ×4 IMPLANT
CANNULA VESSEL 3MM BLUNT TIP (CANNULA) IMPLANT
CATH HEART VENT LEFT (CATHETERS) ×2 IMPLANT
CATH ROBINSON RED A/P 18FR (CATHETERS) ×10 IMPLANT
CLIP RETRACTION 3.0MM CORONARY (MISCELLANEOUS) IMPLANT
CLIP TI MEDIUM 24 (CLIP) IMPLANT
CLIP TI WIDE RED SMALL 24 (CLIP) IMPLANT
CNTNR URN SCR LID CUP LEK RST (MISCELLANEOUS) ×2 IMPLANT
CONN ST 1/2X1/2  BEN (MISCELLANEOUS) ×2
CONN ST 1/2X1/2 BEN (MISCELLANEOUS) ×2 IMPLANT
CONNECTOR BLAKE 2:1 CARIO BLK (MISCELLANEOUS) ×2 IMPLANT
CONT SPEC 4OZ STRL OR WHT (MISCELLANEOUS) ×4
CONTAINER PROTECT SURGISLUSH (MISCELLANEOUS) ×8 IMPLANT
COVER SURGICAL LIGHT HANDLE (MISCELLANEOUS) ×2 IMPLANT
DERMABOND ADVANCED .7 DNX12 (GAUZE/BANDAGES/DRESSINGS) IMPLANT
DEVICE SUT CK QUICK LOAD MINI (Prosthesis & Implant Heart) IMPLANT
DRAIN CHANNEL 19F RND (DRAIN) ×8 IMPLANT
DRAIN CONNECTOR BLAKE 1:1 (MISCELLANEOUS) ×2 IMPLANT
DRAPE CARDIOVASCULAR INCISE (DRAPES) ×2
DRAPE INCISE IOBAN 66X45 STRL (DRAPES) IMPLANT
DRAPE SRG 135X102X78XABS (DRAPES) ×4 IMPLANT
DRAPE WARM FLUID 44X44 (DRAPES) ×4 IMPLANT
DRSG AQUACEL AG ADV 3.5X10 (GAUZE/BANDAGES/DRESSINGS) ×2 IMPLANT
ELECT BLADE 4.0 EZ CLEAN MEGAD (MISCELLANEOUS) ×4
ELECT CAUTERY BLADE 6.4 (BLADE) ×2 IMPLANT
ELECT REM PT RETURN 9FT ADLT (ELECTROSURGICAL) ×4
ELECTRODE BLDE 4.0 EZ CLN MEGD (MISCELLANEOUS) ×2 IMPLANT
ELECTRODE REM PT RTRN 9FT ADLT (ELECTROSURGICAL) ×8 IMPLANT
FELT TEFLON 1X6 (MISCELLANEOUS) ×8 IMPLANT
GAUZE 4X4 16PLY ~~LOC~~+RFID DBL (SPONGE) ×2 IMPLANT
GAUZE SPONGE 4X4 12PLY STRL (GAUZE/BANDAGES/DRESSINGS) ×6 IMPLANT
GAUZE SPONGE 4X4 12PLY STRL LF (GAUZE/BANDAGES/DRESSINGS) IMPLANT
GLOVE BIO SURGEON STRL SZ 6 (GLOVE) IMPLANT
GLOVE BIO SURGEON STRL SZ 6.5 (GLOVE) IMPLANT
GLOVE BIO SURGEON STRL SZ7 (GLOVE) ×10 IMPLANT
GLOVE BIO SURGEON STRL SZ7.5 (GLOVE) IMPLANT
GLOVE BIOGEL M STRL SZ7.5 (GLOVE) ×4 IMPLANT
GLOVE BIOGEL PI IND STRL 6 (GLOVE) IMPLANT
GLOVE BIOGEL PI IND STRL 6.5 (GLOVE) IMPLANT
GLOVE BIOGEL PI IND STRL 7.0 (GLOVE) IMPLANT
GLOVE BIOGEL PI IND STRL 7.5 (GLOVE) IMPLANT
GLOVE SURG SS PI 7.0 STRL IVOR (GLOVE) IMPLANT
GOWN STRL REUS W/ TWL LRG LVL3 (GOWN DISPOSABLE) ×16 IMPLANT
GOWN STRL REUS W/ TWL XL LVL3 (GOWN DISPOSABLE) ×8 IMPLANT
GOWN STRL REUS W/TWL LRG LVL3 (GOWN DISPOSABLE) ×12
GOWN STRL REUS W/TWL XL LVL3 (GOWN DISPOSABLE) ×4
HEMOSTAT POWDER SURGIFOAM 1G (HEMOSTASIS) ×8 IMPLANT
HEMOSTAT SURGICEL 2X14 (HEMOSTASIS) ×2 IMPLANT
INSERT SUTURE HOLDER (MISCELLANEOUS) ×4 IMPLANT
KIT BASIN OR (CUSTOM PROCEDURE TRAY) ×4 IMPLANT
KIT SUCTION CATH 14FR (SUCTIONS) ×4 IMPLANT
KIT SUT CK MINI COMBO 4X17 (Prosthesis & Implant Heart) IMPLANT
KIT TURNOVER KIT B (KITS) ×4 IMPLANT
KIT VASOVIEW HEMOPRO 2 VH 4000 (KITS) ×2 IMPLANT
LEAD PACING MYOCARDI (MISCELLANEOUS) ×2 IMPLANT
LINE VENT (MISCELLANEOUS) IMPLANT
MARKER GRAFT CORONARY BYPASS (MISCELLANEOUS) ×6 IMPLANT
NS IRRIG 1000ML POUR BTL (IV SOLUTION) ×22 IMPLANT
ORGANIZER SUTURE GABBAY-FRATER (MISCELLANEOUS) ×2 IMPLANT
PACK E OPEN HEART (SUTURE) ×4 IMPLANT
PACK OPEN HEART (CUSTOM PROCEDURE TRAY) ×4 IMPLANT
PAD ARMBOARD 7.5X6 YLW CONV (MISCELLANEOUS) ×8 IMPLANT
PAD ELECT DEFIB RADIOL ZOLL (MISCELLANEOUS) ×2 IMPLANT
PENCIL BUTTON HOLSTER BLD 10FT (ELECTRODE) ×2 IMPLANT
POSITIONER HEAD DONUT 9IN (MISCELLANEOUS) ×4 IMPLANT
PUNCH AORTIC ROTATE 4.0MM (MISCELLANEOUS) ×2 IMPLANT
SET MPS 3-ND DEL (MISCELLANEOUS) IMPLANT
SPONGE T-LAP 18X18 ~~LOC~~+RFID (SPONGE) ×8 IMPLANT
SUPPORT HEART JANKE-BARRON (MISCELLANEOUS) ×2 IMPLANT
SUT BONE WAX W31G (SUTURE) ×4 IMPLANT
SUT EB EXC GRN/WHT 2-0 V-5 (SUTURE) ×4 IMPLANT
SUT ETHIBOND X763 2 0 SH 1 (SUTURE) ×4 IMPLANT
SUT MNCRL AB 3-0 PS2 18 (SUTURE) ×4 IMPLANT
SUT MNCRL AB 4-0 PS2 18 (SUTURE) IMPLANT
SUT PDS AB 1 CTX 36 (SUTURE) ×4 IMPLANT
SUT PROLENE 3 0 SH DA (SUTURE) IMPLANT
SUT PROLENE 3 0 SH1 36 (SUTURE) ×2 IMPLANT
SUT PROLENE 4 0 RB 1 (SUTURE) ×10
SUT PROLENE 4 0 SH DA (SUTURE) ×2 IMPLANT
SUT PROLENE 4-0 RB1 .5 CRCL 36 (SUTURE) ×6 IMPLANT
SUT PROLENE 5 0 C 1 36 (SUTURE) ×6 IMPLANT
SUT PROLENE 7 0 BV1 MDA (SUTURE) ×2 IMPLANT
SUT STEEL 6MS V (SUTURE) ×4 IMPLANT
SUT VIC AB 2-0 CT1 27 (SUTURE) ×2
SUT VIC AB 2-0 CT1 TAPERPNT 27 (SUTURE) IMPLANT
SYSTEM SAHARA CHEST DRAIN ATS (WOUND CARE) ×4 IMPLANT
TAPE CLOTH SURG 4X10 WHT LF (GAUZE/BANDAGES/DRESSINGS) IMPLANT
TAPE PAPER 2X10 WHT MICROPORE (GAUZE/BANDAGES/DRESSINGS) IMPLANT
TOWEL GREEN STERILE (TOWEL DISPOSABLE) ×4 IMPLANT
TOWEL GREEN STERILE FF (TOWEL DISPOSABLE) ×4 IMPLANT
TRAY FOLEY SLVR 16FR TEMP STAT (SET/KITS/TRAYS/PACK) ×4 IMPLANT
TUBE CONNECTING 20X1/4 (TUBING) IMPLANT
TUBING LAP HI FLOW INSUFFLATIO (TUBING) ×2 IMPLANT
UNDERPAD 30X36 HEAVY ABSORB (UNDERPADS AND DIAPERS) ×4 IMPLANT
VALVE ON-X AORTIC 23MM (Prosthesis & Implant Heart) IMPLANT
VENT LEFT HEART 12002 (CATHETERS) ×2
WATER STERILE IRR 1000ML POUR (IV SOLUTION) ×8 IMPLANT

## 2022-06-28 NOTE — Procedures (Signed)
Extubation Procedure Note  Patient Details:   Name: Ian Gross DOB: 1975/09/22 MRN: [id number]   Airway Documentation:    Vent end date: 06/28/22 Vent end time: 1710   Evaluation  O2 sats: stable throughout Complications: No apparent complications Patient did tolerate procedure well. Bilateral Breath Sounds: Clear   Yes  Leak test positive, Pt followed commands, NIF -20, VC 1.8 L, RN present during extubation, no complications noted.   Lin Landsman 06/28/2022, 5:13 PM

## 2022-06-28 NOTE — Anesthesia Procedure Notes (Signed)
Central Venous Catheter Insertion Performed by: Duane Boston, MD, anesthesiologist Start/End4/03/2022 6:51 AM, 06/28/2022 7:01 AM Patient location: Pre-op. Preanesthetic checklist: patient identified, IV checked, site marked, risks and benefits discussed, surgical consent, monitors and equipment checked, pre-op evaluation, timeout performed and anesthesia consent Lidocaine 1% used for infiltration and patient sedated Hand hygiene performed  and maximum sterile barriers used  Catheter size: 8.5 Fr Sheath introducer Procedure performed using ultrasound guided technique. Ultrasound Notes:anatomy identified, needle tip was noted to be adjacent to the nerve/plexus identified, no ultrasound evidence of intravascular and/or intraneural injection and image(s) printed for medical record Attempts: 1 Following insertion, line sutured and dressing applied. Post procedure assessment: blood return through all ports, free fluid flow and no air  Patient tolerated the procedure well with no immediate complications.

## 2022-06-28 NOTE — Brief Op Note (Signed)
06/15/2022 - 06/28/2022  11:40 AM  PATIENT:  Ian Gross  47 y.o. male  PRE-OPERATIVE DIAGNOSIS:  1. S/P NSTEMI 2.CORONARY ARTERY DISEASE 3. SEVERE AORTIC REGURGITATION  POST-OPERATIVE DIAGNOSIS:  1. S/P NSTEMI 2.CORONARY ARTERY DISEASE 3. SEVERE AORTIC REGURGITATION  PROCEDURE:  TRANSESOPHAGEAL ECHOCARDIOGRAM, CORONARY ARTERY BYPASS GRAFTING (CABG) X2 (LIMA to LAD and SVG to DIAGONAL) USING LEFT INTERNAL MAMMARY ARTERY AND RIGHT THIGH GREATER SAPHENOUS VEIN HARVESTED ENDOSCOPICALLY, AORTIC VALVE REPLACEMENT (AVR) (USING ON-X PROSTHETIC HEART VALVE, SERIAL # [id number], SIZE 23MM)  Vein harvest time: 16 min Vein prep time: 9 min  SURGEON:  Surgeon(s) and Role:    Lightfoot, Lucile Crater, MD - Primary  PHYSICIAN ASSISTANT: Lars Pinks PA-C  ASSISTANTS: Magda Kiel RNFA   ANESTHESIA:   general  EBL:  Per anesthesia/perfusion record  DRAINS:  Chest tubes placed in the mediastinal and pleural spaces    SPECIMEN:  Source of Specimen:  Native aortic valve leaflets  DISPOSITION OF SPECIMEN:   Culture and pathology  COUNTS CORRECT  YES  DICTATION: .Dragon Dictation  PLAN OF CARE: Admit to inpatient   PATIENT DISPOSITION:  ICU - intubated and hemodynamically stable.   Delay start of Pharmacological VTE agent (>24hrs) due to surgical blood loss or risk of bleeding: no  BASELINE WEIGHT: 53.1 kg

## 2022-06-28 NOTE — Progress Notes (Signed)
      [address]       [place] [address]             [phone]      S/p AVR, CABG x 2  Intubated, waking up and following commands  BP (!) 141/91   Pulse 88   Temp 97.7 F (36.5 C)   Resp 18   Ht 6' (1.829 m)   Wt 53.1 kg   SpO2 100%   BMI 15.88 kg/m  CVP 7, CI 3.0   Intake/Output Summary (Last 24 hours) at 06/28/2022 1707 Last data filed at 06/28/2022 1500 Gross per 24 hour  Intake 3437.22 ml  Output 1206 ml  Net 2231.22 ml   Hct 26, PT 19.7  CT output- 350 ml since OR  Mild respiratory acidosis with pCO2 45, meets parameters- will extubate  Ian Gross Hockey, MD [facility] [phone]

## 2022-06-28 NOTE — TOC Initial Note (Signed)
Transition of Care [facility]) - Initial/Assessment Note    Patient Details  Name: Ian Gross MRN: [id number] Date of Birth: 12-10-1975  Transition of Care [facility]) CM/SW Contact:    Erenest Rasher, RN Phone Number: [phone] 06/28/2022, 1:53 PM  Clinical Narrative:                  CM spoke to pt's mother. Pt will be coming to her home in [place] once released. She will be able to care for him. Pt is from home with SO and minor child.   Will continue to follow for dc needs.    Expected Discharge Plan: Home/Self Care Barriers to Discharge: Continued Medical Work up   Patient Goals and CMS Choice Patient states their goals for this hospitalization and ongoing recovery are:: to go home          Expected Discharge Plan and Services   Discharge Planning Services: CM Consult   Living arrangements for the past 2 months: Apartment                                      Prior Living Arrangements/Services Living arrangements for the past 2 months: Apartment Lives with:: Spouse, Minor Children   Do you feel safe going back to the place where you live?: Yes               Activities of Daily Living      Permission Sought/Granted Permission sought to share information with : Family Supports, Case Manager    Share Information with NAME: Everitt Amber     Permission granted to share info w Relationship: mother  Permission granted to share info w Contact Information: [phone]  Emotional Assessment   Attitude/Demeanor/Rapport: Intubated (Following Commands or Not Following Commands)          Admission diagnosis:  NSTEMI (non-ST elevated myocardial infarction) [I21.4] Coronary artery disease [I25.10] Patient Active Problem List   Diagnosis Date Noted   Coronary artery disease 06/28/2022   NSTEMI (non-ST elevated myocardial infarction) 06/15/2022   DM2 (diabetes mellitus, type 2) 06/02/2021   History of splenectomy 06/02/2021   History of  pancreatectomy 03/17/2021   Chronic pain 03/17/2021   PCP:  Jearld Fenton, NP Pharmacy:   [id number] - [place], [address] [address] Phone: [phone] Fax: [phone]     Social Determinants of Health (SDOH) Social History: SDOH Screenings   Food Insecurity: No Food Insecurity (09/03/2021)  Housing: Low Risk  (09/03/2021)  Transportation Needs: No Transportation Needs (09/03/2021)  Alcohol Screen: Low Risk  (06/02/2021)  Depression (PHQ2-9): Medium Risk (06/02/2021)  Financial Resource Strain: Low Risk  (09/03/2021)  Social Connections: Moderately Isolated (09/03/2021)  Stress: No Stress Concern Present (09/03/2021)  Tobacco Use: High Risk (06/24/2022)   SDOH Interventions:     Readmission Risk Interventions     No data to display

## 2022-06-28 NOTE — Anesthesia Postprocedure Evaluation (Signed)
Anesthesia Post Note  Patient: Ian Gross  Procedure(s) Performed: CORONARY ARTERY BYPASS GRAFTING (CABG) X2 USING LEFT INTERNAL MAMMARY ARTERY AND RIGHT GREATER SAPHENOUS VEIN HARVESTED ENDOSCOPICALLY (Chest) AORTIC VALVE REPLACEMENT (AVR) USING ON-X PROSTHETIC HEART VALVE SIZE 23MM (Chest) TRANSESOPHAGEAL ECHOCARDIOGRAM     Patient location during evaluation: SICU Anesthesia Type: General Level of consciousness: sedated Pain management: pain level controlled Vital Signs Assessment: post-procedure vital signs reviewed and stable Respiratory status: patient remains intubated per anesthesia plan Cardiovascular status: stable Postop Assessment: no apparent nausea or vomiting Anesthetic complications: no  No notable events documented.  Last Vitals:  Vitals:   06/28/22 1345 06/28/22 1400  BP:  (!) 141/91  Pulse:  (!) 105  Resp: 16 16  Temp: 36.6 C 36.6 C  SpO2:  100%    Last Pain:  Vitals:   06/28/22 0601  TempSrc:   PainSc: 3                  Ian Gross DANIEL

## 2022-06-28 NOTE — Anesthesia Procedure Notes (Signed)
Arterial Line Insertion Start/End4/03/2022 7:00 AM, 06/28/2022 7:10 AM Performed by: Duane Boston, MD, Anastasio Auerbach, CRNA, CRNA  Patient location: Pre-op. Preanesthetic checklist: patient identified, IV checked, site marked, risks and benefits discussed, surgical consent, monitors and equipment checked, pre-op evaluation, timeout performed and anesthesia consent Lidocaine 1% used for infiltration and patient sedated Left, radial was placed Catheter size: 20 G  Attempts: 2 Procedure performed without using ultrasound guided technique.

## 2022-06-28 NOTE — Consult Note (Signed)
NAME:  Ian Gross, MRN:  [id number], DOB:  03-20-1976, LOS: 64 ADMISSION DATE:  06/15/2022 CONSULTATION DATE:  06/28/22 REFERRING MD:  Kipp Brood  CHIEF COMPLAINT:  CAD, severe aortic regurgitation  History of Present Illness:  Ian Gross is a 47 y.o. M  seen in consultation at the request of Dr. Kipp Brood (TCTS) for management of mechanical ventilation and hemodynamics post-CABGx2 (LIMA to LAD and SVG to diagonal) and AVR.   Ian Gross has a PMHx significant for alcoholic pancreatitis and prior ETOH abuse who is s/p total pancreatectomy, splenectomy, and cholecystectomy with hepaticojejunostomy and gastrojejunostomy on 05/04/19, chronic pain and tobacco/marijuana use who initially presented to [facility] on 3/19 complaining acute onset chest pain with anterior ST elevations on EKG.  He underwent emergent LHC with revealed severe proximal LAD disease, due to location he was felt to be a high risk for PCI and was transferred to [facility] for CABG.  LVEF 45-50% with severe AVR.  He was transferred to [facility] and underwent CABG x 2 with LIMA to LAD and SVG diagonal using L internal mammary artery and R thigh greater saphenous vein.  Pertinent Medical History:  DM, chronic pancreatitis and prior alcohol abuse who is s/p total pancreatectomy, splenectomy, and cholecystectomy with hepaticojejunostomy and gastrojejunostomy on 05/04/19   Significant Hospital Events: Including procedures, antibiotic start and stop dates in addition to other pertinent events   3/19 presented to [facility] 4/1  - CABG x 2. CCM consulted for MV/hemodynamics.  Interim History / Subjective:  PCCM consulted for vent management post-CABG.  Objective:  Blood pressure (!) 141/91, pulse (!) 105, temperature 97.9 F (36.6 C), resp. rate 16, height 6' (1.829 m), weight 53.1 kg, SpO2 100 %.    Vent Mode: SIMV;PRVC FiO2 (%):  [50 %] 50 % Set Rate:  [16 bmp] 16 bmp Vt Set:  [620 mL] 620 mL PEEP:  [5 cmH20] 5 cmH20 Pressure Support:  [10  cmH20] 10 cmH20 Plateau Pressure:  [16 cmH20] 16 cmH20   Intake/Output Summary (Last 24 hours) at 06/28/2022 1417 Last data filed at 06/28/2022 1314 Gross per 24 hour  Intake 2910 ml  Output 1206 ml  Net 1704 ml   Filed Weights   06/19/22 1104 06/23/22 1421 06/28/22 0526  Weight: 61.3 kg 61.2 kg 53.1 kg   General:  critically ill-appearing M, intubated and sedated  HEENT: MM pink/moist, sclera anicteric  Neuro: examined on precedex, RASS -4 CV: s1s2 rrr, no m/r/g PULM:  mechanically ventilated with clear breath sounds bilaterally, equal chest rise and synchronous with vent GI: soft, non-distended  Extremities: warm/dry, no edema  Skin: no rashes or lesions   Resolved Hospital Problem List:    Assessment & Plan:  Ian Gross is seen in consultation at the request of  (TCTS) for management of mechanical ventilation and hemodynamics post-CABGx2 and AVR.     Acute postoperative ventilator-dependence - Continue full vent support (4-8cc/kg IBW) - Wean FiO2 for O2 sat > 90% - Daily WUA/SBT, rapid wean with SIMV per protocol - VAP bundle - Pulmonary hygiene - F/u ABG - PAD protocol for sedation: Precedex for goal RASS 0 to -1  S/p 2-vessel CABG and AVR CAD Post-op labile BP - Monitor CI, CO - pain control per protocol -post-op ancef  - Continue ASA, statin - Postoperative care per TCTS - CT management per protocol -on low dose neo, briefly required nitro gtt for HTN -continue insulin gtt  -monitor and replete electrolytes   History of ETOH abuse, chronic pancreatitis  DM S/p total pancreatectomy, splenectomy, and cholecystectomy with hepaticojejunostomy and gastrojejunostomy on 05/04/19  -monitor for hypoglycemia -continue insulin gtt today -pancrelipase ordered   Chronic pain -suboxone re-ordered   Best Practice: (right click and "Reselect all SmartList Selections" daily)   Diet/type: NPO DVT prophylaxis: SCD GI prophylaxis: PPI Lines: Central line Foley:  Yes,  and it is still needed Code Status:  full code Last date of multidisciplinary goals of care discussion [per primary]  Labs:  CBC: Recent Labs  Lab 06/23/22 0020 06/24/22 0004 06/25/22 0547 06/26/22 0014 06/28/22 0812 06/28/22 1105 06/28/22 1110 06/28/22 1151 06/28/22 1215 06/28/22 1319  WBC 10.7* 13.4* 13.0* 11.0*  --   --   --   --   --  18.4*  HGB 14.3 13.6 13.1 14.7   < > 7.9* 7.5* 7.5* 7.1* 9.2*  HCT 42.8 39.8 38.9* 43.8   < > 23.3* 22.0* 22.0* 21.0* 26.4*  MCV 97.5 96.4 97.5 97.3  --   --   --   --   --  96.7  PLT 157 174 160 187  --  105*  --   --   --  62*   < > = values in this interval not displayed.    Basic Metabolic Panel: Recent Labs  Lab 06/22/22 0013 06/24/22 0004 06/27/22 0010 06/27/22 2219 06/28/22 [id number] 06/28/22 0815 06/28/22 [id number] 06/28/22 0942 06/28/22 1010 06/28/22 1047 06/28/22 1110 06/28/22 1151 06/28/22 1215  NA 133* 136 134* 131* 135   < > 137   < > 135 135 136 135 139  K 3.9 4.5 4.1 4.3 3.9   < > 3.5   < > 3.9 3.9 4.1 4.3 3.8  CL 97* 99 98 94* 97*  --  99  --  97*  --  100  --   --   CO2 30 28 29 29   --   --   --   --   --   --   --   --   --   GLUCOSE 171* 161* 159* 295* 166*  --  132*  --  120*  --  128*  --   --   BUN 20 20 19 19 19   --  18  --  17  --  16  --   --   CREATININE 1.20 1.12 1.05 1.18 0.90  --  0.80  --  0.70  --  0.70  --   --   CALCIUM 8.5* 9.1 8.7* 8.7*  --   --   --   --   --   --   --   --   --    < > = values in this interval not displayed.   GFR: Estimated Creatinine Clearance: 83.9 mL/min (by C-G formula based on SCr of 0.7 mg/dL). Recent Labs  Lab 06/24/22 0004 06/25/22 0547 06/26/22 0014 06/28/22 1319  WBC 13.4* 13.0* 11.0* 18.4*    Liver Function Tests: Recent Labs  Lab 06/27/22 2219  AST 45*  ALT 44  ALKPHOS 103  BILITOT 0.9  PROT 6.0*  ALBUMIN 3.4*   No results for input(s): "LIPASE", "AMYLASE" in the last 168 hours. No results for input(s): "AMMONIA" in the last 168 hours.  ABG     Component Value Date/Time   PHART 7.436 06/28/2022 1215   PCO2ART 36.3 06/28/2022 1215   PO2ART 240 (H) 06/28/2022 1215   HCO3 24.4 06/28/2022 1215   TCO2 26 06/28/2022 1215   ACIDBASEDEF 1.0 06/28/2022 0942  O2SAT 100 06/28/2022 1215     Coagulation Profile: Recent Labs  Lab 06/27/22 2219 06/28/22 1319  INR 1.1 1.7*    Cardiac Enzymes: No results for input(s): "CKTOTAL", "CKMB", "CKMBINDEX", "TROPONINI" in the last 168 hours.  HbA1C: Hgb A1c MFr Bld  Date/Time Value Ref Range Status  06/16/2022 06:12 AM 7.6 (H) 4.8 - 5.6 % Final    Comment:    (NOTE)         Prediabetes: 5.7 - 6.4         Diabetes: >6.4         Glycemic control for adults with diabetes: <7.0   06/15/2022 03:53 PM 7.3 (H) 4.8 - 5.6 % Final    Comment:    (NOTE)         Prediabetes: 5.7 - 6.4         Diabetes: >6.4         Glycemic control for adults with diabetes: <7.0     CBG: Recent Labs  Lab 06/27/22 1610 06/27/22 2131 06/28/22 0521 06/28/22 1323 06/28/22 1407  GLUCAP 208* 272* 88 108* 121*    Review of Systems:   Unable to obtain  Past Medical History:  He,  has a past medical history of Diabetes mellitus with complication.   Surgical History:   Past Surgical History:  Procedure Laterality Date   DENTAL RESTORATION/EXTRACTION WITH X-RAY N/A 06/23/2022   Procedure: DENTAL RESTORATION/EXTRACTION WITH POSSIBLE X-RAY;  Surgeon: Diona Browner, DMD;  Location: [facility];  Service: Oral Surgery;  Laterality: N/A;   LEFT HEART CATH AND CORONARY ANGIOGRAPHY N/A 06/15/2022   Procedure: LEFT HEART CATH AND CORONARY ANGIOGRAPHY;  Surgeon: Nelva Bush, MD;  Location: [facility];  Service: Cardiovascular;  Laterality: N/A;   PANCREATECTOMY N/A    SPLENECTOMY       Social History:   reports that he has been smoking cigarettes. He has been smoking an average of .75 packs per day. He has never used smokeless tobacco. He reports that he does not currently use alcohol. He reports  that he does not currently use drugs.   Family History:  His family history includes Alcohol abuse in his maternal grandfather; CVA in his maternal grandmother; Healthy in his brother and sister; Liver cancer in his maternal grandfather; Lung cancer in his father.   Allergies: No Known Allergies   Home Medications: Prior to Admission medications   Medication Sig Start Date End Date Taking? Authorizing Provider  Buprenorphine HCl-Naloxone HCl 8-2 MG FILM Place 1 Film under the tongue 2 (two) times daily. 11/15/20  Yes [provider]  HUMALOG KWIKPEN 100 UNIT/ML KwikPen Inject 4-9 Units into the skin 3 (three) times daily. Sliding scale (go up by 1 unit) 12/28/20  Yes [provider]  insulin glargine (LANTUS) 100 UNIT/ML injection Inject 10 Units into the skin at bedtime.   Yes [provider]  Pancrelipase, Lip-Prot-Amyl, (CREON) 24000-76000 units CPEP Take 3 capsules (72,000 Units total) by mouth 3 (three) times daily. 03/11/22  Yes Jearld Fenton, NP     Critical care time: 42 minutes   CRITICAL CARE Performed by: Otilio Carpen Laiya Wisby   Total critical care time: 42 minutes  Critical care time was exclusive of separately billable procedures and treating other patients.  Critical care was necessary to treat or prevent imminent or life-threatening deterioration.  Critical care was time spent personally by me on the following activities: development of treatment plan with patient and/or surrogate as well as nursing,  discussions with consultants, evaluation of patient's response to treatment, examination of patient, obtaining history from patient or surrogate, ordering and performing treatments and interventions, ordering and review of laboratory studies, ordering and review of radiographic studies, pulse oximetry and re-evaluation of patient's condition.   Otilio Carpen Rontae Inglett, PA-C [facility] See Amion for pager If no response to pager ,  please call [phone] until 7pm After 7:00 pm call Elink  [phone]?[place]

## 2022-06-28 NOTE — Transfer of Care (Signed)
Immediate Anesthesia Transfer of Care Note  Patient: Ian Gross  Procedure(s) Performed: CORONARY ARTERY BYPASS GRAFTING (CABG) X2 USING LEFT INTERNAL MAMMARY ARTERY AND RIGHT GREATER SAPHENOUS VEIN HARVESTED ENDOSCOPICALLY (Chest) AORTIC VALVE REPLACEMENT (AVR) USING ON-X PROSTHETIC HEART VALVE SIZE 23MM (Chest) TRANSESOPHAGEAL ECHOCARDIOGRAM  Patient Location: SICU  Anesthesia Type:General  Level of Consciousness: sedated  Airway & Oxygen Therapy: Patient remains intubated per anesthesia plan  Post-op Assessment: Report given to RN and Post -op Vital signs reviewed and stable  Post vital signs: Reviewed and stable  Last Vitals:  Vitals Value Taken Time  BP 89/65 06/28/22 1306  Temp 36.5 C 06/28/22 1315  Pulse 86 06/28/22 1315  Resp 13 06/28/22 1315  SpO2 100 % 06/28/22 1315  Vitals shown include unvalidated device data.  Last Pain:  Vitals:   06/28/22 0601  TempSrc:   PainSc: 3       Patients Stated Pain Goal: 0 ([id number])  Complications: No notable events documented.

## 2022-06-28 NOTE — Op Note (Signed)
[address]       [place], [address]             [phone]                                          06/28/2022 Patient:  Ian Gross Pre-Op Dx: Alfredo Batty LAD disease Severe aortic valve insufficiency   Post-op Dx:  same Procedure: CABG X 2.  LIMA LAD, RSVG Diagonal   Aortic valve replacement with a 64mm On-X valve  Endoscopic greater saphenous vein harvest on the right   Surgeon and Role:      * Lamere Lightner, Lucile Crater, MD - Primary    * Josie Saunders, PA-C - assisting An experienced assistant was required given the complexity of this surgery and the standard of surgical care. The assistant was needed for exposure, dissection, suctioning, retraction of delicate tissues and sutures, instrument exchange and for overall help during this procedure.    Anesthesia  general EBL:  1045ml Blood Administration: none Xclamp Time:  109 min Pump Time:  167min  Drains: 19 F blake drain: R, L, mediastinal  Wires: ventricular Counts: correct   Indications: 47yo male transferred from [facility] for ostial LAD disease.  He was noted moderate to severe AI on echo.  After dental clearance he was brought to the operative theatre for valve replacement and cabg Findings: Good LIMA and vein.  Good LAD and diagonal.  The   Operative Technique: All invasive lines were placed in pre-op holding.  After the risks, benefits and alternatives were thoroughly discussed, the patient was brought to the operative theatre.  Anesthesia was induced, and the patient was prepped and draped in normal sterile fashion.  An appropriate surgical pause was performed, and pre-operative antibiotics were dosed accordingly.  We began with simultaneous incisions along the right leg for harvesting of the greater saphenous vein and the chest for the sternotomy.  In regards to the sternotomy, this was carried down with bovie cautery, and the sternum was divided with a reciprocating saw.   Meticulous hemostasis was obtained.  The left internal thoracic artery was exposed and harvested in in pedicled fashion.  The patient was systemically heparinized, and the artery was divided distally, and placed in a papaverine sponge.    The sternal elevator was removed, and a retractor was placed.  The pericardium was divided in the midline and fashioned into a cradle with pericardial stitches.   After we confirmed an appropriate ACT, the ascending aorta was cannulated in standard fashion.  The right atrial appendage was used for venous cannulation site.  Cardiopulmonary bypass was initiated, and the heart retractor was placed. The cross clamp was applied, and a dose of anterograde cardioplegia was given with good arrest of the heart. Next, we exposed the anterior wall of the heart and identified a good target on diagonal.   An arteriotomy was created.  The vein was anastomosed in an end to side fashion.  Finally, we exposed a good target on the LAD, and fashioned an end to side anastomosis between it and the LITA.    Another dose of anterograde cardioplegia was given with good arrest of the heart.  Our aortotomy was made and directed toward the non coronary cusp.  The valve was inspected.  There appeared to be 4 commisures.  All leaflets were excised.  The  annulus was sized to a 68mm On-X.  The left ventricle was then copiously irrigated.  Pledgeted mattress sutures were placed circumferentially through the annulus.  These sutures were then passed through the sewing ring of the valve.  Once the valve was seated in the annulus, it was secured with Core-knot sutures.  We began to rewarm, and close our aortotomy in 2 layers.  We created an end to side anastomosis between it and the proximal vein graft.  Rings were placed on the proximal anastomosis.  A re-animation dose of cardioplegia was given.  After de-airing the heart, the aortic cross clamp was removed.  We checked our valve function, and for air using the  TEE.    Hemostasis was obtained, and we separated from cardiopulmonary bypass without event.  The heparin was reversed with protamine.  Chest tubes and wires were placed, and the sternum was re-approximated with sternal wires.  The soft tissue and skin were re-approximated wth absorbable suture.    The patient tolerated the procedure without any immediate complications, and was transferred to the ICU in guarded condition.  Ian Gross

## 2022-06-28 NOTE — Anesthesia Procedure Notes (Signed)
Procedure Name: Intubation Date/Time: 06/28/2022 7:59 AM  Performed by: Anastasio Auerbach, CRNAPre-anesthesia Checklist: Patient identified, Emergency Drugs available, Suction available and Patient being monitored Patient Re-evaluated:Patient Re-evaluated prior to induction Oxygen Delivery Method: Circle system utilized Preoxygenation: Pre-oxygenation with 100% oxygen Induction Type: IV induction Ventilation: Mask ventilation without difficulty Laryngoscope Size: Glidescope and 4 Grade View: Grade I Tube type: Oral Tube size: 8.0 mm Number of attempts: 1 Airway Equipment and Method: Stylet and Oral airway Placement Confirmation: ETT inserted through vocal cords under direct vision, positive ETCO2 and breath sounds checked- equal and bilateral Secured at: 23 cm Tube secured with: Tape Dental Injury: Teeth and Oropharynx as per pre-operative assessment  Comments: Elective for teaching

## 2022-06-28 NOTE — Progress Notes (Signed)
     [address]       [place] [address]             [phone]       No events Vitals:   06/27/22 2025 06/28/22 0522  BP: 139/68 131/80  Pulse: 80 86  Resp: 20 14  Temp: 98.6 F (37 C) 98.4 F (36.9 C)  SpO2: 98% 98%   Alert NAD Sinus tach EWOB  OR today for mAVR CABG

## 2022-06-29 ENCOUNTER — Inpatient Hospital Stay (HOSPITAL_COMMUNITY): Payer: Medicare Other

## 2022-06-29 DIAGNOSIS — E871 Hypo-osmolality and hyponatremia: Secondary | ICD-10-CM

## 2022-06-29 DIAGNOSIS — Z79891 Long term (current) use of opiate analgesic: Secondary | ICD-10-CM | POA: Diagnosis not present

## 2022-06-29 DIAGNOSIS — I214 Non-ST elevation (NSTEMI) myocardial infarction: Secondary | ICD-10-CM | POA: Diagnosis not present

## 2022-06-29 DIAGNOSIS — Z951 Presence of aortocoronary bypass graft: Secondary | ICD-10-CM

## 2022-06-29 DIAGNOSIS — Z8639 Personal history of other endocrine, nutritional and metabolic disease: Secondary | ICD-10-CM

## 2022-06-29 LAB — CBC
HCT: 21.1 % — ABNORMAL LOW (ref 39.0–52.0)
HCT: 22.6 % — ABNORMAL LOW (ref 39.0–52.0)
HCT: 24.5 % — ABNORMAL LOW (ref 39.0–52.0)
Hemoglobin: 7 g/dL — ABNORMAL LOW (ref 13.0–17.0)
Hemoglobin: 8 g/dL — ABNORMAL LOW (ref 13.0–17.0)
Hemoglobin: 8.4 g/dL — ABNORMAL LOW (ref 13.0–17.0)
MCH: 32.9 pg (ref 26.0–34.0)
MCH: 33.5 pg (ref 26.0–34.0)
MCH: 33.6 pg (ref 26.0–34.0)
MCHC: 33.2 g/dL (ref 30.0–36.0)
MCHC: 34.3 g/dL (ref 30.0–36.0)
MCHC: 35.4 g/dL (ref 30.0–36.0)
MCV: 95 fL (ref 80.0–100.0)
MCV: 97.6 fL (ref 80.0–100.0)
MCV: 99.1 fL (ref 80.0–100.0)
Platelets: 82 10*3/uL — ABNORMAL LOW (ref 150–400)
Platelets: 92 10*3/uL — ABNORMAL LOW (ref 150–400)
Platelets: 98 10*3/uL — ABNORMAL LOW (ref 150–400)
RBC: 2.13 MIL/uL — ABNORMAL LOW (ref 4.22–5.81)
RBC: 2.38 MIL/uL — ABNORMAL LOW (ref 4.22–5.81)
RBC: 2.51 MIL/uL — ABNORMAL LOW (ref 4.22–5.81)
RDW: 13.2 % (ref 11.5–15.5)
RDW: 13.2 % (ref 11.5–15.5)
RDW: 13.7 % (ref 11.5–15.5)
WBC: 14.5 10*3/uL — ABNORMAL HIGH (ref 4.0–10.5)
WBC: 15.3 10*3/uL — ABNORMAL HIGH (ref 4.0–10.5)
WBC: 17.7 10*3/uL — ABNORMAL HIGH (ref 4.0–10.5)
nRBC: 0 % (ref 0.0–0.2)
nRBC: 0 % (ref 0.0–0.2)
nRBC: 0 % (ref 0.0–0.2)

## 2022-06-29 LAB — BASIC METABOLIC PANEL
Anion gap: 9 (ref 5–15)
Anion gap: 7 (ref 5–15)
Anion gap: 6 (ref 5–15)
BUN: 15 mg/dL (ref 6–20)
BUN: 14 mg/dL (ref 6–20)
BUN: 14 mg/dL (ref 6–20)
CO2: 23 mmol/L (ref 22–32)
CO2: 23 mmol/L (ref 22–32)
CO2: 25 mmol/L (ref 22–32)
Calcium: 8.1 mg/dL — ABNORMAL LOW (ref 8.9–10.3)
Calcium: 8 mg/dL — ABNORMAL LOW (ref 8.9–10.3)
Calcium: 8.3 mg/dL — ABNORMAL LOW (ref 8.9–10.3)
Chloride: 102 mmol/L (ref 98–111)
Chloride: 104 mmol/L (ref 98–111)
Chloride: 100 mmol/L (ref 98–111)
Creatinine, Ser: 0.99 mg/dL (ref 0.61–1.24)
Creatinine, Ser: 0.87 mg/dL (ref 0.61–1.24)
Creatinine, Ser: 1.04 mg/dL (ref 0.61–1.24)
GFR, Estimated: >60 mL/min (ref >60)
GFR, Estimated: >60 mL/min (ref >60)
GFR, Estimated: >60 mL/min (ref >60)
Glucose, Bld: 145 mg/dL — ABNORMAL HIGH (ref 70–99)
Glucose, Bld: 73 mg/dL (ref 70–99)
Glucose, Bld: 244 mg/dL — ABNORMAL HIGH (ref 70–99)
Potassium: 4.3 mmol/L (ref 3.5–5.1)
Potassium: 4.6 mmol/L (ref 3.5–5.1)
Potassium: 4.4 mmol/L (ref 3.5–5.1)
Sodium: 134 mmol/L — ABNORMAL LOW (ref 135–145)
Sodium: 134 mmol/L — ABNORMAL LOW (ref 135–145)
Sodium: 131 mmol/L — ABNORMAL LOW (ref 135–145)

## 2022-06-29 LAB — POCT I-STAT 7, (LYTES, BLD GAS, ICA,H+H)
Acid-base deficit: 3 mmol/L — ABNORMAL HIGH (ref 0.0–2.0)
Bicarbonate: 23.3 mmol/L (ref 20.0–28.0)
Calcium, Ion: 1.22 mmol/L (ref 1.15–1.40)
HCT: 19 % — ABNORMAL LOW (ref 39.0–52.0)
Hemoglobin: 6.5 g/dL — CL (ref 13.0–17.0)
O2 Saturation: 97 %
Patient temperature: 98.8
Potassium: 4.2 mmol/L (ref 3.5–5.1)
Sodium: 137 mmol/L (ref 135–145)
TCO2: 25 mmol/L (ref 22–32)
pCO2 arterial: 44.9 mmHg (ref 32–48)
pH, Arterial: 7.323 — ABNORMAL LOW (ref 7.35–7.45)
pO2, Arterial: 98 mmHg (ref 83–108)

## 2022-06-29 LAB — PREPARE RBC (CROSSMATCH)

## 2022-06-29 LAB — GLUCOSE, CAPILLARY
Glucose-Capillary: 107 mg/dL — ABNORMAL HIGH (ref 70–99)
Glucose-Capillary: 113 mg/dL — ABNORMAL HIGH (ref 70–99)
Glucose-Capillary: 113 mg/dL — ABNORMAL HIGH (ref 70–99)
Glucose-Capillary: 114 mg/dL — ABNORMAL HIGH (ref 70–99)
Glucose-Capillary: 123 mg/dL — ABNORMAL HIGH (ref 70–99)
Glucose-Capillary: 125 mg/dL — ABNORMAL HIGH (ref 70–99)
Glucose-Capillary: 143 mg/dL — ABNORMAL HIGH (ref 70–99)
Glucose-Capillary: 230 mg/dL — ABNORMAL HIGH (ref 70–99)
Glucose-Capillary: 230 mg/dL — ABNORMAL HIGH (ref 70–99)
Glucose-Capillary: 63 mg/dL — ABNORMAL LOW (ref 70–99)
Glucose-Capillary: 74 mg/dL (ref 70–99)
Glucose-Capillary: 86 mg/dL (ref 70–99)
Glucose-Capillary: 93 mg/dL (ref 70–99)

## 2022-06-29 LAB — MAGNESIUM
Magnesium: 2.1 mg/dL (ref 1.7–2.4)
Magnesium: 1.8 mg/dL (ref 1.7–2.4)

## 2022-06-29 LAB — PROTIME-INR
INR: 1.3 — ABNORMAL HIGH (ref 0.8–1.2)
INR: 1.3 — ABNORMAL HIGH (ref 0.8–1.2)
Prothrombin Time: 16.2 seconds — ABNORMAL HIGH (ref 11.4–15.2)
Prothrombin Time: 16.2 seconds — ABNORMAL HIGH (ref 11.4–15.2)

## 2022-06-29 LAB — FIBRINOGEN: Fibrinogen: 235 mg/dL (ref 210–475)

## 2022-06-29 LAB — SURGICAL PATHOLOGY

## 2022-06-29 MED ORDER — PANCRELIPASE (LIP-PROT-AMYL) 12000-38000 UNITS PO CPEP
12000.0000 [IU] | ORAL_CAPSULE | ORAL | Status: DC
  Administered 2022-06-29 – 2022-06-30 (×5): 12000 [IU] via ORAL
  Filled 2022-06-29 (×8): qty 1

## 2022-06-29 MED ORDER — WARFARIN - PHARMACIST DOSING INPATIENT: Freq: Every day | Status: DC

## 2022-06-29 MED ORDER — INSULIN ASPART 100 UNIT/ML IJ SOLN
0.0000 [IU] | Freq: Every day | INTRAMUSCULAR | Status: DC
  Administered 2022-06-29 – 2022-06-30 (×2): 2 [IU] via SUBCUTANEOUS

## 2022-06-29 MED ORDER — SODIUM CHLORIDE 0.9% IV SOLUTION: Freq: Once | INTRAVENOUS | Status: AC

## 2022-06-29 MED ORDER — METHOCARBAMOL 500 MG PO TABS
500.0000 mg | ORAL_TABLET | Freq: Three times a day (TID) | ORAL | Status: DC
  Administered 2022-06-29 – 2022-07-04 (×17): 500 mg via ORAL
  Filled 2022-06-29 (×18): qty 1

## 2022-06-29 MED ORDER — INSULIN GLARGINE-YFGN 100 UNIT/ML ~~LOC~~ SOLN
10.0000 [IU] | Freq: Every day | SUBCUTANEOUS | Status: DC
  Administered 2022-06-29: 10 [IU] via SUBCUTANEOUS
  Filled 2022-06-29 (×2): qty 0.1

## 2022-06-29 MED ORDER — DEXTROSE 10 % IV SOLN: INTRAVENOUS | Status: DC | PRN

## 2022-06-29 MED ORDER — OXYCODONE HCL 5 MG PO TABS
2.5000 mg | ORAL_TABLET | Freq: Four times a day (QID) | ORAL | Status: DC
  Administered 2022-06-29 – 2022-07-03 (×12): 2.5 mg via ORAL
  Filled 2022-06-29 (×13): qty 1

## 2022-06-29 MED ORDER — FENTANYL CITRATE PF 50 MCG/ML IJ SOSY
50.0000 ug | PREFILLED_SYRINGE | INTRAMUSCULAR | Status: DC | PRN
  Administered 2022-06-29 (×4): 50 ug via INTRAVENOUS
  Filled 2022-06-29 (×4): qty 1

## 2022-06-29 MED ORDER — PANCRELIPASE (LIP-PROT-AMYL) 12000-38000 UNITS PO CPEP
24000.0000 [IU] | ORAL_CAPSULE | Freq: Three times a day (TID) | ORAL | Status: DC
  Administered 2022-06-29 – 2022-07-05 (×15): 24000 [IU] via ORAL
  Filled 2022-06-29 (×19): qty 2

## 2022-06-29 MED ORDER — GABAPENTIN 300 MG PO CAPS
300.0000 mg | ORAL_CAPSULE | Freq: Three times a day (TID) | ORAL | Status: DC
  Administered 2022-06-29 – 2022-07-05 (×19): 300 mg via ORAL
  Filled 2022-06-29 (×19): qty 1

## 2022-06-29 MED ORDER — INSULIN ASPART 100 UNIT/ML IJ SOLN
0.0000 [IU] | Freq: Three times a day (TID) | INTRAMUSCULAR | Status: DC
  Administered 2022-06-29: 5 [IU] via SUBCUTANEOUS
  Administered 2022-06-29: 2 [IU] via SUBCUTANEOUS
  Administered 2022-06-30: 11 [IU] via SUBCUTANEOUS
  Administered 2022-06-30: 8 [IU] via SUBCUTANEOUS
  Administered 2022-06-30: 15 [IU] via SUBCUTANEOUS
  Administered 2022-07-01: 5 [IU] via SUBCUTANEOUS
  Administered 2022-07-01: 2 [IU] via SUBCUTANEOUS
  Administered 2022-07-01: 3 [IU] via SUBCUTANEOUS
  Administered 2022-07-03: 5 [IU] via SUBCUTANEOUS
  Administered 2022-07-04: 3 [IU] via SUBCUTANEOUS
  Administered 2022-07-04: 2 [IU] via SUBCUTANEOUS
  Administered 2022-07-04 – 2022-07-05 (×2): 5 [IU] via SUBCUTANEOUS

## 2022-06-29 MED ORDER — WARFARIN SODIUM 5 MG PO TABS
5.0000 mg | ORAL_TABLET | Freq: Once | ORAL | Status: AC
  Administered 2022-06-29: 5 mg via ORAL
  Filled 2022-06-29: qty 1

## 2022-06-29 NOTE — Progress Notes (Signed)
NAME:  Avian Gaster, MRN:  [id number], DOB:  12/25/1975, LOS: 38 ADMISSION DATE:  06/15/2022 CONSULTATION DATE:  06/29/22 REFERRING MD:  Kipp Brood  CHIEF COMPLAINT:  CAD, severe aortic regurgitation  History of Present Illness:  Kenaan Rueter is a 47 y.o. M  seen in consultation at the request of Dr. Kipp Brood (TCTS) for management of mechanical ventilation and hemodynamics post-CABGx2 (LIMA to LAD and SVG to diagonal) and AVR.   Mr. Woodring has a PMHx significant for alcoholic pancreatitis and prior ETOH abuse who is s/p total pancreatectomy, splenectomy, and cholecystectomy with hepaticojejunostomy and gastrojejunostomy on 05/04/19, chronic pain and tobacco/marijuana use who initially presented to [facility] on 3/19 complaining acute onset chest pain with anterior ST elevations on EKG.  He underwent emergent LHC with revealed severe proximal LAD disease, due to location he was felt to be a high risk for PCI and was transferred to [facility] for CABG.  LVEF 45-50% with severe AVR.  He was transferred to [facility] and underwent CABG x 2 with LIMA to LAD and SVG diagonal using L internal mammary artery and R thigh greater saphenous vein.  Pertinent Medical History:  DM, chronic pancreatitis and prior alcohol abuse who is s/p total pancreatectomy, splenectomy, and cholecystectomy with hepaticojejunostomy and gastrojejunostomy on 05/04/19   Significant Hospital Events: Including procedures, antibiotic start and stop dates in addition to other pertinent events   3/19 presented to [facility] 4/1  - CABG x 2. CCM consulted for MV/hemodynamics.  Interim History / Subjective:  Pain is uncontrolled this morning. Tmax 100.6. Remains on low-dose neosynephrine.   Objective:  Blood pressure 110/66, pulse 85, temperature 98.7 F (37.1 C), temperature source Oral, resp. rate 18, height 6' (1.829 m), weight 59.8 kg, SpO2 100 %. CVP:  [0 mmHg-11 mmHg] 7 mmHg  Vent Mode: CPAP;PSV FiO2 (%):  [40 %-50 %] 40 % Set Rate:  [4 bmp-16  bmp] 4 bmp Vt Set:  [620 mL] 620 mL PEEP:  [5 cmH20] 5 cmH20 Pressure Support:  [5 cmH20-10 cmH20] 5 cmH20 Plateau Pressure:  [16 cmH20-17 cmH20] 17 cmH20   Intake/Output Summary (Last 24 hours) at 06/29/2022 0704 Last data filed at 06/29/2022 0700 Gross per 24 hour  Intake 5978.12 ml  Output 4235 ml  Net 1743.12 ml    Filed Weights   06/23/22 1421 06/28/22 0526 06/29/22 0500  Weight: 61.2 kg 53.1 kg 59.8 kg   General:  middle aged man lying in bed in AND HEENT: [place]/AT, eyes anicteric  Neuro: awake, answering questions appropriately CV: S12, RRR PULM:  CTAB, no tachypnea or accessory muscle use GI: thin, nondistended  Extremities: minimal edema, no cyanosis Skin: warm, dry, no rashes  Na+  134 BUN 14 Cr 0.87 WBC 14.5 H/H 8/22.6 Platelets 82 CXR personally reviewed> RIJ introducer, chest tubes in place. Tiny apical pneumothorax.  EKG: NSR, normal axis. Normal intervals. TWI V2-3-- mostly flattened. No acute ischemic findings.  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, splenectomy, and cholecystectomy with hepaticojejunostomy and gastrojejunostomy on 05/04/19  -transition to  basal-bolus insulin; if not eating much may need d10w with insulin dose -SSI PRN -start creon-- 2 packets with meals, 1 with snacks PRN  Acute on chronic pain -hold suboxone -scheduled oxycodone + PRN meds per protocol -starting robaxin  and gabapentin for multimodal pain control  ABLA, expected post-op Consumptive thrombocytopenia- expected post-op -transfuse for Hb <7 or hemodynamically significant bleeding -monitor  Hyponatremia -monitor  Tobacco abuse -recommend cessation -nicotine replacement therapy  Best Practice: (right click and "Reselect all SmartList Selections" daily)   Diet/type: clear liquids, progress as tolerated DVT prophylaxis: SCD GI prophylaxis: PPI Lines: Central line Foley:  Yes, and it is still needed Code Status:  full code Last date of multidisciplinary goals of care discussion [per primary]  Labs:  CBC: Recent Labs  Lab 06/26/22 0014 06/28/22 0812 06/28/22 1105 06/28/22 1110 06/28/22 1319 06/28/22 1326 06/28/22 1819 06/28/22 1930 06/29/22 0017 06/29/22 0019 06/29/22 0430  WBC 11.0*  --   --   --  18.4*  --   --  18.0* 15.3*  --  14.5*  HGB 14.7   < > 7.9*   < > 9.2*   < > 7.8* 8.0* 7.0* 6.5* 8.0*  HCT 43.8   < > 23.3*   < > 26.4*   < > 23.0* 22.3* 21.1* 19.0* 22.6*  MCV 97.3  --   --   --  96.7  --   --  96.1 99.1  --  95.0  PLT 187  --  105*  --  62*  --   --  87* 92*  --  82*   < > = values in this interval not displayed.     Basic Metabolic Panel: Recent Labs  Lab 06/27/22 0010 06/27/22 2219 06/28/22 0812 06/28/22 1010 06/28/22 1047 06/28/22 1110 06/28/22 1151 06/28/22 1819 06/28/22 1930 06/29/22 0017 06/29/22 0019 06/29/22 0430  NA 134* 131*   < > 135   < > 136   < > 136 133* 134* 137 134*  K 4.1 4.3   < > 3.9   < > 4.1   < > 4.7 4.7 4.3 4.2 4.6  CL 98 94*   < > 97*  --  100  --   --  103 102  --  104  CO2 29 29  --   --   --   --   --   --  24 23  --  23  GLUCOSE 159* 295*   < > 120*  --  128*  --   --  133* 145*  --  73  BUN 19 19   < > 17  --  16  --   --  14 15  --  14  CREATININE 1.05 1.18   < > 0.70  --  0.70  --   --  0.93 0.99  --  0.87  CALCIUM 8.7* 8.7*  --   --   --   --   --   --  7.8* 8.1*  --  8.0*  MG  --   --   --   --   --   --   --    --   --  2.1  --   --    < > = values in this interval not displayed.    GFR: Estimated Creatinine Clearance: 86.9 mL/min (by C-G formula based on SCr of 0.87 mg/dL). Recent Labs  Lab 06/28/22 1319 06/28/22 1930 06/29/22 0017 06/29/22 0430  WBC 18.4* 18.0* 15.3* 14.5*     Critical care time:     This patient is critically ill with multiple organ system failure which requires frequent high complexity decision making, assessment, support, evaluation, and titration of therapies. This was completed through the application of advanced monitoring technologies and extensive interpretation of multiple databases. During this encounter critical care time was devoted to patient care services described in this note for 36 minutes.  Julian Hy, DO 06/29/22 8:28 AM [place]  For contact information, see Amion. If no response to pager, please call PCCM consult pager. After hours, 7PM- 7AM, please call Elink.

## 2022-06-29 NOTE — Progress Notes (Addendum)
Patient dumped 710 from CT's with new air leak into chamber 2 on midnight check. Patient has been increasingly tachycardic with reports of worsening pain in his lower back, and had 1 episode of vomiting- unresolved by PRN medications. BP stable on neo @10 , CVP 6.   STAT labs and CXR completed. Dr. Kipp Brood notified of results.   Orders received for 1 unit of blood and PRN fentanyl.

## 2022-06-29 NOTE — Progress Notes (Signed)
TCTS Progress Note: 1 Day Post-Op Procedure(s) (LRB): CORONARY ARTERY BYPASS GRAFTING (CABG) X2 USING LEFT INTERNAL MAMMARY ARTERY AND RIGHT GREATER SAPHENOUS VEIN HARVESTED ENDOSCOPICALLY (N/A) AORTIC VALVE REPLACEMENT (AVR) USING ON-X PROSTHETIC HEART VALVE SIZE 23MM (N/A) TRANSESOPHAGEAL ECHOCARDIOGRAM (N/A)  LOS: 14 days   Looks well, HR 95-100 C/o upper central back pain, mild-moderate     Latest Ref Rng & Units 06/29/2022    4:30 AM 06/29/2022   12:19 AM 06/29/2022   12:17 AM  CBC  WBC 4.0 - 10.5 K/uL 14.5   15.3   Hemoglobin 13.0 - 17.0 g/dL 8.0  6.5  7.0   Hematocrit 39.0 - 52.0 % 22.6  19.0  21.1   Platelets 150 - 400 K/uL 82   92        Latest Ref Rng & Units 06/29/2022    4:30 AM 06/29/2022   12:19 AM 06/29/2022   12:17 AM  CMP  Glucose 70 - 99 mg/dL 73   145   BUN 6 - 20 mg/dL 14   15   Creatinine 0.61 - 1.24 mg/dL 0.87   0.99   Sodium 135 - 145 mmol/L 134  137  134   Potassium 3.5 - 5.1 mmol/L 4.6  4.2  4.3   Chloride 98 - 111 mmol/L 104   102   CO2 22 - 32 mmol/L 23   23   Calcium 8.9 - 10.3 mg/dL 8.0   8.1     ABG    Component Value Date/Time   PHART 7.323 (L) 06/29/2022 0019   PCO2ART 44.9 06/29/2022 0019   PO2ART 98 06/29/2022 0019   HCO3 23.3 06/29/2022 0019   TCO2 25 06/29/2022 0019   ACIDBASEDEF 3.0 (H) 06/29/2022 0019   O2SAT 97 06/29/2022 0019

## 2022-06-29 NOTE — Progress Notes (Signed)
ANTICOAGULATION CONSULT NOTE - Initial Consult  Pharmacy Consult for warfarin Indication:  aortic valve replacement  No Known Allergies  Patient Measurements: Height: 6' (182.9 cm) Weight: 59.8 kg (131 lb 13.4 oz) IBW/kg (Calculated) : 77.6  Vital Signs: Temp: 98.7 F (37.1 C) (04/02 0235) Temp Source: Oral (04/02 0235) BP: 125/78 (04/02 0956) Pulse Rate: 116 (04/02 0956)  Labs: Recent Labs    06/27/22 2219 06/28/22 0812 06/28/22 1319 06/28/22 1326 06/28/22 1930 06/29/22 0017 06/29/22 0019 06/29/22 0430  HGB  --    < > 9.2*   < > 8.0* 7.0* 6.5* 8.0*  HCT  --    < > 26.4*   < > 22.3* 21.1* 19.0* 22.6*  PLT  --    < > 62*  --  87* 92*  --  82*  APTT 27  --  33  --   --   --   --   --   LABPROT 13.6  --  19.7*  --   --  16.2*  --  16.2*  INR 1.1  --  1.7*  --   --  1.3*  --  1.3*  CREATININE 1.18   < >  --   --  0.93 0.99  --  0.87   < > = values in this interval not displayed.   Estimated Creatinine Clearance: 86.9 mL/min (by C-G formula based on SCr of 0.87 mg/dL).  Medical History: Past Medical History:  Diagnosis Date   Alcoholic pancreatitis    s/p pancreatectomy   Diabetes mellitus with complication    H/O splenectomy     Medications:  Scheduled:   acetaminophen  1,000 mg Oral Q6H   Or   acetaminophen (TYLENOL) oral liquid 160 mg/5 mL  1,000 mg Per Tube Q6H   aspirin EC  325 mg Oral Daily   Or   aspirin  324 mg Per Tube Daily   atorvastatin  80 mg Oral Daily   bisacodyl  10 mg Oral Daily   Or   bisacodyl  10 mg Rectal Daily   Chlorhexidine Gluconate Cloth  6 each Topical Daily   docusate sodium  200 mg Oral Daily   gabapentin  300 mg Oral TID   insulin aspart  0-15 Units Subcutaneous TID WC   insulin aspart  0-5 Units Subcutaneous QHS   insulin glargine-yfgn  10 Units Subcutaneous Daily   lipase/protease/amylase  12,000 Units Oral With snacks   lipase/protease/amylase  24,000 Units Oral TID   methocarbamol  500 mg Oral TID   metoprolol  tartrate  12.5 mg Oral BID   Or   metoprolol tartrate  12.5 mg Per Tube BID   nicotine  14 mg Transdermal Daily   oxyCODONE  2.5 mg Oral Q6H   [START ON 06/30/2022] pantoprazole  40 mg Oral Daily   sodium chloride flush  3 mL Intravenous Q12H   Assessment: 47 y/o male presenting with chest pain, found to have a STEMI on 06/15/22. PMH of T2DM (insulin dependent s/p pancreatectomy), HTN, alcohol use disorder. He underwent CABG x2 with AVR on 06/28/22. Pharmacy has been consulted to dose warfarin. Patient has no history of anticoagulation prior to admission.  Baseline INR is 1.3. Hgb 8, plt 82  Goal of Therapy:  INR 2-3 for first three months, then goal will change to 1.5-2 (On-X prosthetic valve) Monitor platelets by anticoagulation protocol: Yes   Plan:  Give warfarin 5mg  x1 this afternoon Check daily INR and CBC Monitor for DDIs and  signs/symptoms of bleeding  Louanne Belton, PharmD PGY1 Pharmacy Resident 06/29/2022 11:14 AM

## 2022-06-29 NOTE — Progress Notes (Signed)
      [address]       [place] [address]             [phone]                 1 Day Post-Op Procedure(s) (LRB): CORONARY ARTERY BYPASS GRAFTING (CABG) X2 USING LEFT INTERNAL MAMMARY ARTERY AND RIGHT GREATER SAPHENOUS VEIN HARVESTED ENDOSCOPICALLY (N/A) AORTIC VALVE REPLACEMENT (AVR) USING ON-X PROSTHETIC HEART VALVE SIZE 23MM (N/A) TRANSESOPHAGEAL ECHOCARDIOGRAM (N/A)   Events: No events _______________________________________________________________ Vitals: BP 110/66   Pulse 85   Temp 98.7 F (37.1 C) (Oral)   Resp 18   Ht 6' (1.829 m)   Wt 59.8 kg   SpO2 100%   BMI 17.88 kg/m  Filed Weights   06/23/22 1421 06/28/22 0526 06/29/22 0500  Weight: 61.2 kg 53.1 kg 59.8 kg     - Neuro: alert NAd  - Cardiovascular: sinus  Drips: neo 20.   CVP:  [0 mmHg-11 mmHg] 7 mmHg  - Pulm: EWOB   ABG    Component Value Date/Time   PHART 7.323 (L) 06/29/2022 0019   PCO2ART 44.9 06/29/2022 0019   PO2ART 98 06/29/2022 0019   HCO3 23.3 06/29/2022 0019   TCO2 25 06/29/2022 0019   ACIDBASEDEF 3.0 (H) 06/29/2022 0019   O2SAT 97 06/29/2022 0019    - Abd: ND - Extremity: warm  .Intake/Output      04/01 0701 04/02 0700 04/02 0701 04/03 0700   I.V. (mL/kg) 3121.4 (52.2)    Blood 725    IV Piggyback 2126.2    Total Intake(mL/kg) 5972.7 (99.9)    Urine (mL/kg/hr) 2030 (1.4)    Blood 631    Chest Tube 1574    Total Output 4235    Net +1737.7            _______________________________________________________________ Labs:    Latest Ref Rng & Units 06/29/2022    4:30 AM 06/29/2022   12:19 AM 06/29/2022   12:17 AM  CBC  WBC 4.0 - 10.5 K/uL 14.5   15.3   Hemoglobin 13.0 - 17.0 g/dL 8.0  6.5  7.0   Hematocrit 39.0 - 52.0 % 22.6  19.0  21.1   Platelets 150 - 400 K/uL 82   92       Latest Ref Rng & Units 06/29/2022    4:30 AM 06/29/2022   12:19 AM 06/29/2022   12:17 AM  CMP  Glucose 70 - 99 mg/dL 73   145   BUN 6 - 20 mg/dL 14   15   Creatinine 0.61  - 1.24 mg/dL 0.87   0.99   Sodium 135 - 145 mmol/L 134  137  134   Potassium 3.5 - 5.1 mmol/L 4.6  4.2  4.3   Chloride 98 - 111 mmol/L 104   102   CO2 22 - 32 mmol/L 23   23   Calcium 8.9 - 10.3 mg/dL 8.0   8.1     CXR: clear  _______________________________________________________________  Assessment and Plan: POD 1 s/p AVR/CABG  Neuro: adjusting pain control.   CV: will wean neo.  On A/S.  Starting coumadin for mechanical valve Pulm: IS, ambulation Renal: creat stable.  Will remove foley GI: on diet.  Adding creon  Heme: stable ID: afebrile Endo: chronic insulin Dispo: continue ICU care.   Lajuana Matte 06/29/2022 7:53 AM

## 2022-06-30 ENCOUNTER — Inpatient Hospital Stay (HOSPITAL_COMMUNITY): Payer: Medicare Other

## 2022-06-30 DIAGNOSIS — Z79891 Long term (current) use of opiate analgesic: Secondary | ICD-10-CM | POA: Diagnosis not present

## 2022-06-30 DIAGNOSIS — K8689 Other specified diseases of pancreas: Secondary | ICD-10-CM

## 2022-06-30 DIAGNOSIS — I214 Non-ST elevation (NSTEMI) myocardial infarction: Secondary | ICD-10-CM | POA: Diagnosis not present

## 2022-06-30 DIAGNOSIS — Z8639 Personal history of other endocrine, nutritional and metabolic disease: Secondary | ICD-10-CM | POA: Diagnosis not present

## 2022-06-30 LAB — BASIC METABOLIC PANEL
Anion gap: 4 — ABNORMAL LOW (ref 5–15)
BUN: 13 mg/dL (ref 6–20)
CO2: 27 mmol/L (ref 22–32)
Calcium: 8 mg/dL — ABNORMAL LOW (ref 8.9–10.3)
Chloride: 98 mmol/L (ref 98–111)
Creatinine, Ser: 0.99 mg/dL (ref 0.61–1.24)
GFR, Estimated: >60 mL/min (ref >60)
Glucose, Bld: 385 mg/dL — ABNORMAL HIGH (ref 70–99)
Potassium: 4.4 mmol/L (ref 3.5–5.1)
Sodium: 129 mmol/L — ABNORMAL LOW (ref 135–145)

## 2022-06-30 LAB — CBC
HCT: 21.3 % — ABNORMAL LOW (ref 39.0–52.0)
Hemoglobin: 7.1 g/dL — ABNORMAL LOW (ref 13.0–17.0)
MCH: 32.7 pg (ref 26.0–34.0)
MCHC: 33.3 g/dL (ref 30.0–36.0)
MCV: 98.2 fL (ref 80.0–100.0)
Platelets: 104 10*3/uL — ABNORMAL LOW (ref 150–400)
RBC: 2.17 MIL/uL — ABNORMAL LOW (ref 4.22–5.81)
RDW: 13.6 % (ref 11.5–15.5)
WBC: 17.6 10*3/uL — ABNORMAL HIGH (ref 4.0–10.5)
nRBC: 0 % (ref 0.0–0.2)

## 2022-06-30 LAB — GLUCOSE, CAPILLARY
Glucose-Capillary: 405 mg/dL — ABNORMAL HIGH (ref 70–99)
Glucose-Capillary: 308 mg/dL — ABNORMAL HIGH (ref 70–99)
Glucose-Capillary: 155 mg/dL — ABNORMAL HIGH (ref 70–99)
Glucose-Capillary: 290 mg/dL — ABNORMAL HIGH (ref 70–99)
Glucose-Capillary: 230 mg/dL — ABNORMAL HIGH (ref 70–99)

## 2022-06-30 LAB — PROTIME-INR
INR: 1.9 — ABNORMAL HIGH (ref 0.8–1.2)
Prothrombin Time: 21.9 seconds — ABNORMAL HIGH (ref 11.4–15.2)

## 2022-06-30 MED ORDER — POTASSIUM CHLORIDE CRYS ER 20 MEQ PO TBCR
40.0000 meq | EXTENDED_RELEASE_TABLET | Freq: Every day | ORAL | Status: DC
  Administered 2022-06-30 – 2022-07-02 (×3): 40 meq via ORAL
  Filled 2022-06-30 (×3): qty 2

## 2022-06-30 MED ORDER — FUROSEMIDE 40 MG PO TABS
40.0000 mg | ORAL_TABLET | Freq: Every day | ORAL | Status: DC
  Administered 2022-06-30 – 2022-07-02 (×3): 40 mg via ORAL
  Filled 2022-06-30 (×3): qty 1

## 2022-06-30 MED ORDER — FAMOTIDINE 20 MG PO TABS
20.0000 mg | ORAL_TABLET | Freq: Every day | ORAL | Status: DC
  Administered 2022-06-30 – 2022-07-05 (×6): 20 mg via ORAL
  Filled 2022-06-30 (×6): qty 1

## 2022-06-30 MED ORDER — INSULIN ASPART 100 UNIT/ML IJ SOLN
4.0000 [IU] | Freq: Three times a day (TID) | INTRAMUSCULAR | Status: DC
  Administered 2022-06-30 – 2022-07-01 (×2): 4 [IU] via SUBCUTANEOUS

## 2022-06-30 MED ORDER — ASPIRIN 81 MG PO TBEC
81.0000 mg | DELAYED_RELEASE_TABLET | Freq: Every day | ORAL | Status: DC
  Administered 2022-06-30 – 2022-07-02 (×3): 81 mg via ORAL
  Filled 2022-06-30 (×3): qty 1

## 2022-06-30 MED ORDER — ASPIRIN 81 MG PO CHEW: 81.0000 mg | CHEWABLE_TABLET | Freq: Every day | ORAL | Status: DC

## 2022-06-30 MED ORDER — WARFARIN SODIUM 2.5 MG PO TABS
2.5000 mg | ORAL_TABLET | Freq: Once | ORAL | Status: AC
  Administered 2022-06-30: 2.5 mg via ORAL
  Filled 2022-06-30: qty 1

## 2022-06-30 MED ORDER — POTASSIUM CHLORIDE 20 MEQ PO PACK
40.0000 meq | PACK | Freq: Every day | ORAL | Status: DC
  Filled 2022-06-30: qty 2

## 2022-06-30 MED ORDER — SODIUM CHLORIDE 0.9% FLUSH
3.0000 mL | Freq: Two times a day (BID) | INTRAVENOUS | Status: DC
  Administered 2022-06-30 – 2022-07-02 (×5): 3 mL via INTRAVENOUS

## 2022-06-30 MED ORDER — METOPROLOL TARTRATE 25 MG PO TABS
25.0000 mg | ORAL_TABLET | Freq: Two times a day (BID) | ORAL | Status: DC
  Administered 2022-06-30 (×2): 25 mg via ORAL
  Filled 2022-06-30 (×4): qty 1

## 2022-06-30 MED ORDER — ~~LOC~~ CARDIAC SURGERY, PATIENT & FAMILY EDUCATION: Freq: Once | Status: AC

## 2022-06-30 MED ORDER — INSULIN GLARGINE-YFGN 100 UNIT/ML ~~LOC~~ SOLN
10.0000 [IU] | Freq: Two times a day (BID) | SUBCUTANEOUS | Status: DC
  Filled 2022-06-30 (×2): qty 0.1

## 2022-06-30 MED ORDER — INSULIN GLARGINE-YFGN 100 UNIT/ML ~~LOC~~ SOLN
15.0000 [IU] | Freq: Two times a day (BID) | SUBCUTANEOUS | Status: DC
  Administered 2022-06-30 – 2022-07-01 (×3): 15 [IU] via SUBCUTANEOUS
  Filled 2022-06-30 (×6): qty 0.15

## 2022-06-30 MED ORDER — SODIUM CHLORIDE 0.9% FLUSH: 3.0000 mL | INTRAVENOUS | Status: DC | PRN

## 2022-06-30 MED ORDER — SODIUM CHLORIDE 0.9 % IV SOLN: 250.0000 mL | INTRAVENOUS | Status: DC | PRN

## 2022-06-30 MED FILL — Lidocaine HCl Local Preservative Free (PF) Inj 2%: INTRAMUSCULAR | Qty: 14 | Status: AC

## 2022-06-30 MED FILL — Heparin Sodium (Porcine) Inj 1000 Unit/ML: Qty: 1000 | Status: AC

## 2022-06-30 MED FILL — Potassium Chloride Inj 2 mEq/ML: INTRAVENOUS | Qty: 40 | Status: AC

## 2022-06-30 NOTE — Progress Notes (Signed)
NAME:  Ian Gross, MRN:  [id number], DOB:  24-Jun-1975, LOS: 51 ADMISSION DATE:  06/15/2022 CONSULTATION DATE:  06/30/22 REFERRING MD:  Ian Gross  CHIEF COMPLAINT:  CAD, severe aortic regurgitation  History of Present Illness:  Ian Gross is a 47 y.o. M  seen in consultation at the request of Dr. Kipp Gross (TCTS) for management of mechanical ventilation and hemodynamics post-CABGx2 (LIMA to LAD and SVG to diagonal) and AVR.   Ian Gross has a PMHx significant for alcoholic pancreatitis and prior ETOH abuse who is s/p total pancreatectomy, splenectomy, and cholecystectomy with hepaticojejunostomy and gastrojejunostomy on 05/04/19, chronic pain and tobacco/marijuana use who initially presented to [facility] on 3/19 complaining acute onset chest pain with anterior ST elevations on EKG.  He underwent emergent LHC with revealed severe proximal LAD disease, due to location he was felt to be a high risk for PCI and was transferred to [facility] for CABG.  LVEF 45-50% with severe AVR.  He was transferred to [facility] and underwent CABG x 2 with LIMA to LAD and SVG diagonal using L internal mammary artery and R thigh greater saphenous vein.  Pertinent Medical History:  DM, chronic pancreatitis and prior alcohol abuse who is s/p total pancreatectomy, splenectomy, and cholecystectomy with hepaticojejunostomy and gastrojejunostomy on 05/04/19   Significant Hospital Events: Including procedures, antibiotic start and stop dates in addition to other pertinent events   3/19 presented to [facility] 4/1  - CABG x 2. CCM consulted for MV/hemodynamics.  Interim History / Subjective:  Pain is better controlled today. Afebrile.   Objective:  Blood pressure (!) 143/113, pulse (!) 101, temperature 98.6 F (37 C), temperature source Oral, resp. rate 17, height 6' (1.829 m), weight 59.8 kg, SpO2 98 %.        Intake/Output Summary (Last 24 hours) at 06/30/2022 0707 Last data filed at 06/30/2022 0600 Gross per 24 hour  Intake 876.29 ml   Output 1310 ml  Net -433.71 ml    Filed Weights   06/28/22 0526 06/29/22 0500 06/30/22 0500  Weight: 53.1 kg 59.8 kg 59.8 kg   General:  chronically ill appearing man lying in bed in NAD HEENT: [place]/AT, eyes anicteric Neuro: awake, alert, moving all extremities CV: S1S2, RRR PULM:  breathing comfortably on [place] GI: thin, soft, NT Extremities: no significant edema, no cyanosis Skin: warm, dry, no rashes  Na+  129 BUN 13 Cr 0.99 WBC 17.6 H/H 7.1/21.3 Platelets 104 CXR personally reviewed> gastric air bubble, elevated left hemidiaphragm.     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, splenectomy, and cholecystectomy with hepaticojejunostomy and gastrojejunostomy on 05/04/19  -insulin glargine 10 units BID -SSI PRN -goal BG 140-180 -start creon-- 2 packets with meals, 1 with snacks PRN  Acute on chronic pain -con't to hold suboxone -con't scheduled oxycodone + PRN meds -con't robaxin & gabapentin  ABLA, expected post-op Consumptive thrombocytopenia- expected post-op -transfuse for Hb <7 or hemodynamically significant bleeding- defer transfusion decisions to TCTS -con't to monitor  Hyponatremia -monitor, avoid low solute fluids  Tobacco abuse -recommend cessation -nicotine replacement therapy  Transferring out of the ICU today.  PCCM will be available as needed.   Best Practice: (right click and "Reselect all SmartList Selections" daily)   Diet/type: Regular consistency (see orders) DVT prophylaxis:  Coumadin GI prophylaxis: PPI Lines: Central line Foley:  N/A Code Status:  full code Last date of multidisciplinary goals of care discussion [per primary]  Labs:  CBC: Recent Labs  Lab 06/28/22 1930 06/29/22 0017 06/29/22 0019 06/29/22 0430 06/29/22 1702 06/30/22 0455  WBC 18.0* 15.3*  --  14.5* 17.7* 17.6*  HGB 8.0* 7.0* 6.5* 8.0* 8.4* 7.1*  HCT 22.3* 21.1* 19.0* 22.6* 24.5* 21.3*  MCV 96.1 99.1  --  95.0 97.6 98.2  PLT 87* 92*  --  82* 98* 104*     Basic Metabolic Panel: Recent Labs  Lab 06/28/22 1930 06/29/22 0017 06/29/22 0019 06/29/22 0430 06/29/22 1702 06/30/22 0455  NA 133* 134* 137 134* 131* 129*  K 4.7 4.3 4.2 4.6 4.4 4.4  CL 103 102  --  104 100 98  CO2 24 23  --  23 25 27   GLUCOSE 133* 145*  --  73 244* 385*  BUN 14 15  --  14 14 13   CREATININE 0.93 0.99  --  0.87 1.04 0.99  CALCIUM 7.8* 8.1*  --  8.0* 8.3* 8.0*  MG  --  2.1  --   --  1.8  --     GFR: Estimated Creatinine Clearance: 76.3 mL/min (by C-G formula based on SCr of 0.99 mg/dL). Recent Labs  Lab 06/29/22 0017 06/29/22 0430 06/29/22 1702 06/30/22 0455  WBC 15.3* 14.5* 17.7* 17.6*     Critical care time:       Julian Hy, DO 06/30/22 9:15 AM [place] [facility]  For contact information, see Amion. If no response to pager, please call PCCM consult pager. After hours, 7PM- 7AM, please call Elink.

## 2022-06-30 NOTE — Progress Notes (Signed)
[place] for warfarin Indication:  aortic valve replacement  No Known Allergies  Patient Measurements: Height: 6' (182.9 cm) Weight: 59.8 kg (131 lb 13.4 oz) IBW/kg (Calculated) : 77.6  Vital Signs: Temp: 98.6 F (37 C) (04/03 0635) Temp Source: Oral (04/03 0635) BP: 143/113 (04/03 0615) Pulse Rate: 101 (04/03 0615)  Labs: Recent Labs    06/27/22 2219 06/28/22 [id number] 06/28/22 1319 06/28/22 1326 06/29/22 0017 06/29/22 0019 06/29/22 0430 06/29/22 1702 06/30/22 0455  HGB  --    < > 9.2*   < > 7.0*   < > 8.0* 8.4* 7.1*  HCT  --    < > 26.4*   < > 21.1*   < > 22.6* 24.5* 21.3*  PLT  --    < > 62*   < > 92*  --  82* 98* 104*  APTT 27  --  33  --   --   --   --   --   --   LABPROT 13.6  --  19.7*  --  16.2*  --  16.2*  --  21.9*  INR 1.1  --  1.7*  --  1.3*  --  1.3*  --  1.9*  CREATININE 1.18   < >  --    < > 0.99  --  0.87 1.04 0.99   < > = values in this interval not displayed.   Estimated Creatinine Clearance: 76.3 mL/min (by C-G formula based on SCr of 0.99 mg/dL).  Medical History: Past Medical History:  Diagnosis Date   Alcoholic pancreatitis    s/p pancreatectomy   Diabetes mellitus with complication    H/O splenectomy     Medications:  Scheduled:   acetaminophen  1,000 mg Oral Q6H   Or   acetaminophen (TYLENOL) oral liquid 160 mg/5 mL  1,000 mg Per Tube Q6H   aspirin EC  325 mg Oral Daily   Or   aspirin  324 mg Per Tube Daily   atorvastatin  80 mg Oral Daily   bisacodyl  10 mg Oral Daily   Or   bisacodyl  10 mg Rectal Daily   Chlorhexidine Gluconate Cloth  6 each Topical Daily   [facility], Patient & Family Education   Does not apply Once   docusate sodium  200 mg Oral Daily   furosemide  40 mg Oral Daily   gabapentin  300 mg Oral TID   insulin aspart  0-15 Units Subcutaneous TID WC   insulin aspart  0-5 Units Subcutaneous QHS   insulin glargine-yfgn  15 Units Subcutaneous BID    lipase/protease/amylase  12,000 Units Oral With snacks   lipase/protease/amylase  24,000 Units Oral TID   methocarbamol  500 mg Oral TID   metoprolol tartrate  25 mg Oral BID   nicotine  14 mg Transdermal Daily   oxyCODONE  2.5 mg Oral Q6H   pantoprazole  40 mg Oral Daily   potassium chloride  40 mEq Oral Daily   sodium chloride flush  3 mL Intravenous Q12H   sodium chloride flush  3 mL Intravenous Q12H   Warfarin - Pharmacist Dosing Inpatient   Does not apply q1600   Assessment: 47 y/o male presenting with chest pain, found to have a STEMI on 06/15/22. PMH of T2DM (insulin dependent s/p pancreatectomy), HTN, alcohol use disorder. He underwent CABG x2 with AVR on 06/28/22. Pharmacy has been consulted to dose warfarin. Patient has no history of anticoagulation prior  to admission.   INR this morning is 1.9, Hgb 7.1, plt 104. No signs or symptoms of bleeding noted.   Goal of Therapy:  INR 2-3 for first three months, then goal will change to 1.5-2 (On-X prosthetic valve) Monitor platelets by anticoagulation protocol: Yes   Plan:  Give warfarin 2.5 mg x1 this afternoon Check daily INR and CBC Monitor for DDIs and signs/symptoms of bleeding  Louanne Belton, PharmD PGY1 Pharmacy Resident 06/30/2022 8:14 AM

## 2022-06-30 NOTE — Progress Notes (Signed)
     [address]       [place] [address]             [phone]        EVENING ROUNDS  POD #2 AVR/CABG Pain improved after CT out Ambulating Starting coumadin

## 2022-06-30 NOTE — Progress Notes (Signed)
CARDIAC REHAB PHASE I   PRE:  Rate/Rhythm: 100 ST    BP: sitting 106/68    SpO2: 94 2L  MODE:  Ambulation: 540 ft   POST:  Rate/Rhythm: 122 ST    BP: sitting 97/79     SpO2: 92 2L  Pt moved to EOB and stood following sternal precautions. Ambulated independently with [place] and 2L. No major c/o. To recliner. Encouraged IS and another walk.  [place] BS, ACSM-CEP 06/30/2022 3:18 PM

## 2022-06-30 NOTE — Inpatient Diabetes Management (Signed)
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 Noted Semglee increased to 15 units BID today and CBG 405 at 6:35 am today. Please consider changing Semglee to 13 units daily, adding Novolog 4 units TID with meals for meal coverage if patient eats at least 50% of meals and decreasing Novolog correction to 0-9 units TID with meals.  Thanks, Barnie Alderman, RN, MSN, [place] Diabetes Coordinator Inpatient Diabetes Program [phone] (Team Pager from 8am to [place])

## 2022-06-30 NOTE — Progress Notes (Signed)
[address]       [place] [address]             [phone]                 2 Days Post-Op Procedure(s) (LRB): CORONARY ARTERY BYPASS GRAFTING (CABG) X2 USING LEFT INTERNAL MAMMARY ARTERY AND RIGHT GREATER SAPHENOUS VEIN HARVESTED ENDOSCOPICALLY (N/A) AORTIC VALVE REPLACEMENT (AVR) USING ON-X PROSTHETIC HEART VALVE SIZE 23MM (N/A) TRANSESOPHAGEAL ECHOCARDIOGRAM (N/A)   Events: No events _______________________________________________________________ Vitals: BP (!) 143/113   Pulse (!) 101   Temp 98.6 F (37 C) (Oral)   Resp 17   Ht 6' (1.829 m)   Wt 59.8 kg   SpO2 98%   BMI 17.88 kg/m  Filed Weights   06/28/22 0526 06/29/22 0500 06/30/22 0500  Weight: 53.1 kg 59.8 kg 59.8 kg     - Neuro: alert NAd  - Cardiovascular: sinus  Drips: none    - Pulm: EWOB   ABG    Component Value Date/Time   PHART 7.323 (L) 06/29/2022 0019   PCO2ART 44.9 06/29/2022 0019   PO2ART 98 06/29/2022 0019   HCO3 23.3 06/29/2022 0019   TCO2 25 06/29/2022 0019   ACIDBASEDEF 3.0 (H) 06/29/2022 0019   O2SAT 97 06/29/2022 0019    - Abd: ND - Extremity: warm  .Intake/Output      04/02 0701 04/03 0700 04/03 0701 04/04 0700   I.V. (mL/kg) 576.2 (9.6)    Blood     IV Piggyback 300.1    Total Intake(mL/kg) 876.3 (14.7)    Urine (mL/kg/hr) 1100 (0.8)    Stool 0    Blood     Chest Tube 210    Total Output 1310    Net -433.7         Stool Occurrence 1 x       _______________________________________________________________ Labs:    Latest Ref Rng & Units 06/30/2022    4:55 AM 06/29/2022    5:02 PM 06/29/2022    4:30 AM  CBC  WBC 4.0 - 10.5 K/uL 17.6  17.7  14.5   Hemoglobin 13.0 - 17.0 g/dL 7.1  8.4  8.0   Hematocrit 39.0 - 52.0 % 21.3  24.5  22.6   Platelets 150 - 400 K/uL 104  98  82       Latest Ref Rng & Units 06/30/2022    4:55 AM 06/29/2022    5:02 PM 06/29/2022    4:30 AM  CMP  Glucose 70 - 99 mg/dL 385  244  73   BUN 6 - 20 mg/dL 13  14  14     Creatinine 0.61 - 1.24 mg/dL 0.99  1.04  0.87   Sodium 135 - 145 mmol/L 129  131  134   Potassium 3.5 - 5.1 mmol/L 4.4  4.4  4.6   Chloride 98 - 111 mmol/L 98  100  104   CO2 22 - 32 mmol/L 27  25  23    Calcium 8.9 - 10.3 mg/dL 8.0  8.3  8.0     CXR: clear  _______________________________________________________________  Assessment and Plan: POD 2 s/p AVR/CABG  Neuro: adjusting pain control.   CV: On A/S/BB.  Starting coumadin for mechanical valve.  Will remove pacing wires Pulm: IS, ambulation Renal: creat stable.  diuresing GI: on diet and creon  Heme: hgb 7.1.  INR 1.9 ID: afebrile Endo: chronic insulin s/p pancreatectomy.  Adjusting long term insulin dose Dispo:  floor.   Lajuana Matte 06/30/2022 7:53 AM

## 2022-07-01 LAB — BASIC METABOLIC PANEL
Anion gap: 5 (ref 5–15)
BUN: 13 mg/dL (ref 6–20)
CO2: 30 mmol/L (ref 22–32)
Calcium: 8.3 mg/dL — ABNORMAL LOW (ref 8.9–10.3)
Chloride: 99 mmol/L (ref 98–111)
Creatinine, Ser: 1.13 mg/dL (ref 0.61–1.24)
GFR, Estimated: >60 mL/min (ref >60)
Glucose, Bld: 177 mg/dL — ABNORMAL HIGH (ref 70–99)
Potassium: 3.8 mmol/L (ref 3.5–5.1)
Sodium: 134 mmol/L — ABNORMAL LOW (ref 135–145)

## 2022-07-01 LAB — GLUCOSE, CAPILLARY
Glucose-Capillary: 210 mg/dL — ABNORMAL HIGH (ref 70–99)
Glucose-Capillary: 127 mg/dL — ABNORMAL HIGH (ref 70–99)
Glucose-Capillary: 156 mg/dL — ABNORMAL HIGH (ref 70–99)
Glucose-Capillary: 42 mg/dL — CL (ref 70–99)
Glucose-Capillary: 80 mg/dL (ref 70–99)

## 2022-07-01 LAB — BPAM RBC
Blood Product Expiration Date: 202404232359
Blood Product Expiration Date: 202404232359
ISSUE DATE / TIME: 202404020110
Unit Type and Rh: 6200
Unit Type and Rh: 6200

## 2022-07-01 LAB — TYPE AND SCREEN
ABO/RH(D): A POS
Antibody Screen: NEGATIVE
Unit division: 0
Unit division: 0

## 2022-07-01 LAB — CBC
HCT: 20.6 % — ABNORMAL LOW (ref 39.0–52.0)
Hemoglobin: 7.4 g/dL — ABNORMAL LOW (ref 13.0–17.0)
MCH: 33.6 pg (ref 26.0–34.0)
MCHC: 35.9 g/dL (ref 30.0–36.0)
MCV: 93.6 fL (ref 80.0–100.0)
Platelets: 139 10*3/uL — ABNORMAL LOW (ref 150–400)
RBC: 2.2 MIL/uL — ABNORMAL LOW (ref 4.22–5.81)
RDW: 13.2 % (ref 11.5–15.5)
WBC: 21.2 10*3/uL — ABNORMAL HIGH (ref 4.0–10.5)
nRBC: 0 % (ref 0.0–0.2)

## 2022-07-01 LAB — PROTIME-INR
INR: 3 — ABNORMAL HIGH (ref 0.8–1.2)
Prothrombin Time: 30.7 seconds — ABNORMAL HIGH (ref 11.4–15.2)

## 2022-07-01 LAB — COMPREHENSIVE METABOLIC PANEL: eGFR: >60

## 2022-07-01 MED ORDER — PANCRELIPASE (LIP-PROT-AMYL) 12000-38000 UNITS PO CPEP: 12000.0000 [IU] | ORAL_CAPSULE | ORAL | Status: DC | PRN

## 2022-07-01 MED ORDER — ALUM & MAG HYDROXIDE-SIMETH 200-200-20 MG/5ML PO SUSP
30.0000 mL | Freq: Once | ORAL | Status: AC
  Administered 2022-07-01: 30 mL via ORAL
  Filled 2022-07-01: qty 30

## 2022-07-01 MED ORDER — INSULIN ASPART 100 UNIT/ML IJ SOLN
7.0000 [IU] | Freq: Three times a day (TID) | INTRAMUSCULAR | Status: DC
  Administered 2022-07-01 – 2022-07-02 (×2): 7 [IU] via SUBCUTANEOUS

## 2022-07-01 NOTE — TOC Progression Note (Addendum)
Transition of Care [facility]) - Progression Note    Patient Details  Name: Ian Gross MRN: [id number] Date of Birth: 1975-09-15  Transition of Care [facility]) CM/SW Contact  Erenest Rasher, RN Phone Number: [phone] 07/01/2022, 11:47 AM  Clinical Narrative:    Ambulating independently, may need oxygen for home.Will continue to follow. Transfer scheduled to 4E. Pt scheduled to dc home to stay with mother for few weeks.   [place] preoperatively set up with [facility]   Expected Discharge Plan: Home/Self Care Barriers to Discharge: Continued Medical Work up  Expected Discharge Plan and Services   Discharge Planning Services: CM Consult   Living arrangements for the past 2 months: Apartment                                       Social Determinants of Health (SDOH) Interventions SDOH Screenings   Food Insecurity: No Food Insecurity (09/03/2021)  Housing: Low Risk  (09/03/2021)  Transportation Needs: No Transportation Needs (09/03/2021)  Alcohol Screen: Low Risk  (06/02/2021)  Depression (PHQ2-9): Medium Risk (06/02/2021)  Financial Resource Strain: Low Risk  (09/03/2021)  Social Connections: Moderately Isolated (09/03/2021)  Stress: No Stress Concern Present (09/03/2021)  Tobacco Use: High Risk (06/28/2022)    Readmission Risk Interventions     No data to display

## 2022-07-01 NOTE — Progress Notes (Signed)
[address]       [place] [address]             [phone]                 3 Days Post-Op Procedure(s) (LRB): CORONARY ARTERY BYPASS GRAFTING (CABG) X2 USING LEFT INTERNAL MAMMARY ARTERY AND RIGHT GREATER SAPHENOUS VEIN HARVESTED ENDOSCOPICALLY (N/A) AORTIC VALVE REPLACEMENT (AVR) USING ON-X PROSTHETIC HEART VALVE SIZE 23MM (N/A) TRANSESOPHAGEAL ECHOCARDIOGRAM (N/A)   Events: No events _______________________________________________________________ Vitals: BP 109/71   Pulse 99   Temp 99.1 F (37.3 C) (Oral)   Resp 17   Ht 6' (1.829 m)   Wt 59.7 kg   SpO2 (!) 88%   BMI 17.85 kg/m  Filed Weights   06/29/22 0500 06/30/22 0500 07/01/22 0500  Weight: 59.8 kg 59.8 kg 59.7 kg     - Neuro: alert NAd  - Cardiovascular: sinus  Drips: none    - Pulm: EWOB   ABG    Component Value Date/Time   PHART 7.323 (L) 06/29/2022 0019   PCO2ART 44.9 06/29/2022 0019   PO2ART 98 06/29/2022 0019   HCO3 23.3 06/29/2022 0019   TCO2 25 06/29/2022 0019   ACIDBASEDEF 3.0 (H) 06/29/2022 0019   O2SAT 97 06/29/2022 0019    - Abd: ND - Extremity: warm  .Intake/Output      04/03 0701 04/04 0700 04/04 0701 04/05 0700   P.O. 240    I.V. (mL/kg) 23.1 (0.4)    IV Piggyback     Total Intake(mL/kg) 263.1 (4.4)    Urine (mL/kg/hr) 1050 (0.7)    Stool     Chest Tube 200    Total Output 1250    Net -986.9         Urine Occurrence 1 x       _______________________________________________________________ Labs:    Latest Ref Rng & Units 07/01/2022   12:45 AM 06/30/2022    4:55 AM 06/29/2022    5:02 PM  CBC  WBC 4.0 - 10.5 K/uL 21.2  17.6  17.7   Hemoglobin 13.0 - 17.0 g/dL 7.4  7.1  8.4   Hematocrit 39.0 - 52.0 % 20.6  21.3  24.5   Platelets 150 - 400 K/uL 139  104  98       Latest Ref Rng & Units 07/01/2022   12:45 AM 06/30/2022    4:55 AM 06/29/2022    5:02 PM  CMP  Glucose 70 - 99 mg/dL 177  385  244   BUN 6 - 20 mg/dL 13  13  14    Creatinine 0.61 -  1.24 mg/dL 1.13  0.99  1.04   Sodium 135 - 145 mmol/L 134  129  131   Potassium 3.5 - 5.1 mmol/L 3.8  4.4  4.4   Chloride 98 - 111 mmol/L 99  98  100   CO2 22 - 32 mmol/L 30  27  25    Calcium 8.9 - 10.3 mg/dL 8.3  8.0  8.3     CXR: clear  _______________________________________________________________  Assessment and Plan: POD 3 s/p AVR/CABG  Neuro: adjusting pain control.   CV: On A/S/BB.  on coumadin for mechanical valve.  Pulm: IS, ambulation Renal: creat stable.  diuresing GI: on diet and creon  Heme: hgb 7.1.  INR jumped to 3.  Holding for now. ID: afebrile Endo: chronic insulin s/p pancreatectomy.  Adjusting long term insulin dose Dispo: floor.   Ian Gross Ian Gross  07/01/2022 8:54 AM

## 2022-07-01 NOTE — Progress Notes (Signed)
CARDIAC REHAB PHASE I     Pt resting in bed, feeling well today. About to have late lunch. Pt reports ambulating  independently several times per day, using walker only for longer walks in hall. Reports tolerating well with no dizziness or SOB. Reviewed IS use and sternal precautions. Will continue to follow.  [id number]  Vanessa Barbara, RN BSN 07/01/2022 2:53 PM

## 2022-07-01 NOTE — Progress Notes (Signed)
ANTICOAGULATION CONSULT NOTE  Pharmacy Consult for warfarin Indication:  aortic valve replacement  No Known Allergies  Patient Measurements: Height: 6' (182.9 cm) Weight: 59.7 kg (131 lb 9.8 oz) IBW/kg (Calculated) : 77.6  Vital Signs: Temp: 99.1 F (37.3 C) (04/04 0640) Temp Source: Oral (04/04 0640) BP: 95/63 (04/04 0922) Pulse Rate: 94 (04/04 0922)  Labs: Recent Labs    06/28/22 1319 06/28/22 1326 06/29/22 0430 06/29/22 1702 06/30/22 0455 07/01/22 0045  HGB 9.2*   < > 8.0* 8.4* 7.1* 7.4*  HCT 26.4*   < > 22.6* 24.5* 21.3* 20.6*  PLT 62*   < > 82* 98* 104* 139*  APTT 33  --   --   --   --   --   LABPROT 19.7*   < > 16.2*  --  21.9* 30.7*  INR 1.7*   < > 1.3*  --  1.9* 3.0*  CREATININE  --    < > 0.87 1.04 0.99 1.13   < > = values in this interval not displayed.   Estimated Creatinine Clearance: 66.8 mL/min (by C-G formula based on SCr of 1.13 mg/dL).  Medical History: Past Medical History:  Diagnosis Date   Alcoholic pancreatitis    s/p pancreatectomy   Diabetes mellitus with complication    H/O splenectomy     Medications:  Scheduled:   acetaminophen  1,000 mg Oral Q6H   Or   acetaminophen (TYLENOL) oral liquid 160 mg/5 mL  1,000 mg Per Tube Q6H   aspirin EC  81 mg Oral Daily   Or   aspirin  81 mg Per Tube Daily   atorvastatin  80 mg Oral Daily   bisacodyl  10 mg Oral Daily   Or   bisacodyl  10 mg Rectal Daily   Chlorhexidine Gluconate Cloth  6 each Topical Daily   docusate sodium  200 mg Oral Daily   famotidine  20 mg Oral Daily   furosemide  40 mg Oral Daily   gabapentin  300 mg Oral TID   insulin aspart  0-15 Units Subcutaneous TID WC   insulin aspart  0-5 Units Subcutaneous QHS   insulin aspart  7 Units Subcutaneous TID WC   insulin glargine-yfgn  15 Units Subcutaneous BID   lipase/protease/amylase  24,000 Units Oral TID   methocarbamol  500 mg Oral TID   metoprolol tartrate  25 mg Oral BID   nicotine  14 mg Transdermal Daily   oxyCODONE   2.5 mg Oral Q6H   pantoprazole  40 mg Oral Daily   potassium chloride  40 mEq Oral Daily   sodium chloride flush  3 mL Intravenous Q12H   sodium chloride flush  3 mL Intravenous Q12H   Warfarin - Pharmacist Dosing Inpatient   Does not apply q1600   Assessment: 47 y/o male presenting with chest pain, found to have a STEMI on 06/15/22. PMH of T2DM (insulin dependent s/p pancreatectomy), HTN, alcohol use disorder. He underwent CABG x2 with AVR on 06/28/22. Pharmacy has been consulted to dose warfarin. Patient has no history of anticoagulation prior to admission.   INR at top of goal range this morning at 3.0. CBC is stable, no signs/symptoms of bleeding noted.   Goal of Therapy:  INR 2-3 for first three months, then goal will change to 1.5-2 (On-X prosthetic valve) Monitor platelets by anticoagulation protocol: Yes   Plan:  Hold warfarin this afternoon Check daily INR and CBC Monitor for DDIs and signs/symptoms of bleeding  Louanne Belton,  PharmD PGY1 Pharmacy Resident 07/01/2022 10:42 AM

## 2022-07-01 NOTE — Progress Notes (Signed)
NAME:  Ian Gross, MRN:  [id number], DOB:  01/02/1976, LOS: 27 ADMISSION DATE:  06/15/2022 CONSULTATION DATE:  07/01/22 REFERRING MD:  Kipp Brood  CHIEF COMPLAINT:  CAD, severe aortic regurgitation  History of Present Illness:  Ian Gross is a 47 y.o. M  seen in consultation at the request of Dr. Kipp Brood (TCTS) for management of mechanical ventilation and hemodynamics post-CABGx2 (LIMA to LAD and SVG to diagonal) and AVR.   Mr. Fadness has a PMHx significant for alcoholic pancreatitis and prior ETOH abuse who is s/p total pancreatectomy, splenectomy, and cholecystectomy with hepaticojejunostomy and gastrojejunostomy on 05/04/19, chronic pain and tobacco/marijuana use who initially presented to [facility] on 3/19 complaining acute onset chest pain with anterior ST elevations on EKG.  He underwent emergent LHC with revealed severe proximal LAD disease, due to location he was felt to be a high risk for PCI and was transferred to [facility] for CABG.  LVEF 45-50% with severe AVR.  He was transferred to [facility] and underwent CABG x 2 with LIMA to LAD and SVG diagonal using L internal mammary artery and R thigh greater saphenous vein.  Pertinent Medical History:  DM, chronic pancreatitis and prior alcohol abuse who is s/p total pancreatectomy, splenectomy, and cholecystectomy with hepaticojejunostomy and gastrojejunostomy on 05/04/19   Significant Hospital Events: Including procedures, antibiotic start and stop dates in addition to other pertinent events   3/[facility] to [facility] 4/[facility]  - CABG x 2. CCM consulted for MV/hemodynamics.  Interim History / Subjective:  Doing well other than heartburn. Was better overnight with a new med.  Objective:  Blood pressure 109/71, pulse 99, temperature 99.1 F (37.3 C), temperature source Oral, resp. rate 17, height 6' (1.829 m), weight 59.7 kg, SpO2 (!) 88 %.        Intake/Output Summary (Last 24 hours) at 07/01/2022 0726 Last data filed at 06/30/2022 2200 Gross per 24  hour  Intake 263.12 ml  Output 1250 ml  Net -986.88 ml    Filed Weights   06/29/22 0500 06/30/22 0500 07/01/22 0500  Weight: 59.8 kg 59.8 kg 59.7 kg   General:  chronically ill appearing man sitting up in the chair in NAD HEENT: [place]/AT, eyes anicteric Neuro: awake, alert, moving all extremities, normal speech CV: S1S2, RRR PULM: breathing comfortably on RA, CTAB  GI: thin, soft, NT Extremities: no cyanosis, mild ankle edema Skin: warm, dry, no rashes. Incision healing well without erythama.   Na+  134 BUN 13 Cr 1.13 WBC 21.2 H/H 7.4/20.6 Platelets 139 INR 3.0   Resolved Hospital Problem List:    Assessment & Plan:    S/p 2-vessel CABG and AVR CAD, previous STEMI -aspirin, statin, metoprolol -coumadin dosing per pharmacy -post-op management per TCTS -con't increasing mobility -pain control per protocol; con't scheduled oxycodone instead of suboxone until PRN needs are reduced -tele monitoring  History of ETOH abuse, chronic pancreatitis, s/p pancreatectomy DM- insulin dependent post pancreatectomy Pancreatic exocrine deficiency. S/p total pancreatectomy, splenectomy, and cholecystectomy with hepaticojejunostomy and gastrojejunostomy on 05/04/19  -insulin glargine 15 units BID -increase insulin aspart to 7 units TIDAC; hold if not eating -goal BG <180 -Con't creon-- 2 packets with meals, 1 with snacks PRN  Acute on chronic pain -keep holding suboxone until PRN pain med needs are reduced -scheduled oxycodone while suboxone is held -con't robaxin & gabapentin  GERD -con't PPI, pepcid -adding Gi cocktail  ABLA, expected post-op Consumptive thrombocytopenia- expected post-op -transfusions per TCTS -monitor  Hyponatremia -monitor, avoid low solute fluids  Tobacco abuse -nicotine replacement, recommend cessation  Transferring out of the ICU today. PCCM will be available as needed. Discussed during multidisciplinary rounds.   Best Practice: (right click  and "Reselect all SmartList Selections" daily)   Diet/type: Regular consistency (see orders) DVT prophylaxis: Coumadin GI prophylaxis: PPI Lines: N/A Foley:  N/A Code Status:  full code Last date of multidisciplinary goals of care discussion [per primary]  Labs:  CBC: Recent Labs  Lab 06/29/22 0017 06/29/22 0019 06/29/22 0430 06/29/22 1702 06/30/22 0455 07/01/22 0045  WBC 15.3*  --  14.5* 17.7* 17.6* 21.2*  HGB 7.0* 6.5* 8.0* 8.4* 7.1* 7.4*  HCT 21.1* 19.0* 22.6* 24.5* 21.3* 20.6*  MCV 99.1  --  95.0 97.6 98.2 93.6  PLT 92*  --  82* 98* 104* 139*     Basic Metabolic Panel: Recent Labs  Lab 06/29/22 0017 06/29/22 0019 06/29/22 0430 06/29/22 1702 06/30/22 0455 07/01/22 0045  NA 134* 137 134* 131* 129* 134*  K 4.3 4.2 4.6 4.4 4.4 3.8  CL 102  --  104 100 98 99  CO2 23  --  23 25 27 30   GLUCOSE 145*  --  73 244* 385* 177*  BUN 15  --  14 14 13 13   CREATININE 0.99  --  0.87 1.04 0.99 1.13  CALCIUM 8.1*  --  8.0* 8.3* 8.0* 8.3*  MG 2.1  --   --  1.8  --   --     GFR: Estimated Creatinine Clearance: 66.8 mL/min (by C-G formula based on SCr of 1.13 mg/dL). Recent Labs  Lab 06/29/22 0430 06/29/22 1702 06/30/22 0455 07/01/22 0045  WBC 14.5* 17.7* 17.6* 21.2*     Critical care time:       Julian Hy, DO 07/01/22 12:37 PM [facility]  For contact information, see Amion. If no response to pager, please call PCCM consult pager. After hours, 7PM- 7AM, please call Elink.

## 2022-07-02 LAB — PROTIME-INR
INR: 3.4 — ABNORMAL HIGH (ref 0.8–1.2)
Prothrombin Time: 33.7 seconds — ABNORMAL HIGH (ref 11.4–15.2)

## 2022-07-02 LAB — CBC
HCT: 21.2 % — ABNORMAL LOW (ref 39.0–52.0)
Hemoglobin: 7.2 g/dL — ABNORMAL LOW (ref 13.0–17.0)
MCH: 32.7 pg (ref 26.0–34.0)
MCHC: 34 g/dL (ref 30.0–36.0)
MCV: 96.4 fL (ref 80.0–100.0)
Platelets: 244 10*3/uL (ref 150–400)
RBC: 2.2 MIL/uL — ABNORMAL LOW (ref 4.22–5.81)
RDW: 13.3 % (ref 11.5–15.5)
WBC: 18.1 10*3/uL — ABNORMAL HIGH (ref 4.0–10.5)
nRBC: 0.1 % (ref 0.0–0.2)

## 2022-07-02 LAB — GLUCOSE, CAPILLARY
Glucose-Capillary: 151 mg/dL — ABNORMAL HIGH (ref 70–99)
Glucose-Capillary: 154 mg/dL — ABNORMAL HIGH (ref 70–99)
Glucose-Capillary: 202 mg/dL — ABNORMAL HIGH (ref 70–99)
Glucose-Capillary: 111 mg/dL — ABNORMAL HIGH (ref 70–99)
Glucose-Capillary: 193 mg/dL — ABNORMAL HIGH (ref 70–99)

## 2022-07-02 MED ORDER — LACTULOSE 10 GM/15ML PO SOLN: 20.0000 g | Freq: Once | ORAL | Status: DC

## 2022-07-02 MED ORDER — METOPROLOL TARTRATE 25 MG PO TABS
37.5000 mg | ORAL_TABLET | Freq: Two times a day (BID) | ORAL | Status: DC
  Administered 2022-07-02 – 2022-07-03 (×3): 37.5 mg via ORAL
  Filled 2022-07-02 (×3): qty 1

## 2022-07-02 MED ORDER — INSULIN GLARGINE-YFGN 100 UNIT/ML ~~LOC~~ SOLN
10.0000 [IU] | Freq: Two times a day (BID) | SUBCUTANEOUS | Status: DC
  Administered 2022-07-02 (×2): 10 [IU] via SUBCUTANEOUS
  Filled 2022-07-02 (×4): qty 0.1

## 2022-07-02 MED ORDER — FE FUM-VIT C-VIT B12-FA 460-60-0.01-1 MG PO CAPS
1.0000 | ORAL_CAPSULE | Freq: Every day | ORAL | Status: DC
  Administered 2022-07-02 – 2022-07-05 (×4): 1 via ORAL
  Filled 2022-07-02 (×4): qty 1

## 2022-07-02 NOTE — Progress Notes (Signed)
Patient up and ambulating to the bathroom and in the room. Mews activation not needed.  07/02/22 0434  Vitals  Temp 99.3 F (37.4 C)  Temp Source Oral  BP 111/68  MAP (mmHg) 82  BP Location Right Arm  BP Method Automatic  Patient Position (if appropriate) Lying  Pulse Rate (!) 116  ECG Heart Rate (!) 116  Resp 19  Level of Consciousness  Level of Consciousness Alert  Oxygen Therapy  SpO2 96 %  Pain Assessment  Pain Scale 0-10  Pain Score 6  Patients Stated Pain Goal 0  Pain Location Chest  Pain Frequency Intermittent  Pain Onset On-going  Pain Intervention(s) Medication (See eMAR)  MEWS Score  MEWS Temp 0  MEWS Systolic 0  MEWS Pulse 2  MEWS RR 0  MEWS LOC 0  MEWS Score 2  MEWS Score Color Yellow

## 2022-07-02 NOTE — Progress Notes (Signed)
Patient with complaint of not receiving his choice of pain medication. Educated patient on our need to protect and preserve his blood pressure which drop into the upper 80's systolic pressure after earlier dose of oxycodone. Charge nurse was made aware of patient's complaint and reiterated nursing's concern for his clinical safety.  07/02/22 0545  Vitals  BP 116/69  MAP (mmHg) 84  BP Location Right Arm  BP Method Automatic  Patient Position (if appropriate) Lying  Pulse Rate (!) 117  Pulse Rate Source Monitor  ECG Heart Rate (!) 116  Resp 13  Level of Consciousness  Level of Consciousness Alert  Oxygen Therapy  SpO2 94 %  MEWS Score  MEWS Temp 0  MEWS Systolic 0  MEWS Pulse 2  MEWS RR 1  MEWS LOC 0  MEWS Score 3  MEWS Score Color Yellow

## 2022-07-02 NOTE — Progress Notes (Signed)
CARDIAC REHAB PHASE I     Pt resting in bed, feeling ok today. Complaints of feeling tired, reports getting little sleep over night related to increased pain. Would like to defer walk til later this morning. Pt placed on RA O2 sats decreased to 88-89%. Replaced O2 per [place] at 1 liter. Encouraged 3 walks per day and IS use. Will return later today for walk, as time permits. Will continue to follow.   [phone] Woodroe Chen, RN BSN 07/02/2022 10:05 AM

## 2022-07-02 NOTE — Progress Notes (Addendum)
      [address]       [place] [address]             [phone]        4 Days Post-Op Procedure(s) (LRB): CORONARY ARTERY BYPASS GRAFTING (CABG) X2 USING LEFT INTERNAL MAMMARY ARTERY AND RIGHT GREATER SAPHENOUS VEIN HARVESTED ENDOSCOPICALLY (N/A) AORTIC VALVE REPLACEMENT (AVR) USING ON-X PROSTHETIC HEART VALVE SIZE (N/A) TRANSESOPHAGEAL ECHOCARDIOGRAM (N/A)  Subjective: Patient has not had a bowel movement yet.  Objective: Vital signs in last 24 hours: Temp:  [97.7 F (36.5 C)-99.3 F (37.4 C)] 98.9 F (37.2 C) (04/05 0545) Pulse Rate:  [93-117] 117 (04/05 0545) Cardiac Rhythm: Sinus tachycardia (04/05 0403) Resp:  [9-19] 13 (04/05 0545) BP: (91-116)/(55-71) 116/69 (04/05 0545) SpO2:  [88 %-97 %] 94 % (04/05 0545) Weight:  [57.8 kg] 57.8 kg (04/05 0545)  Pre op weight 53.1 kg Current Weight  07/02/22 57.8 kg       Intake/Output from previous day: 04/04 0701 - 04/05 0700 In: 240 [P.O.:240] Out: -    Physical Exam:  Cardiovascular: Tachycardic Pulmonary: Slightly diminished bibasilar breath sounds Abdomen: Soft, non tender, bowel sounds present. Extremities: Trace bilateral lower extremity edema. Wounds: Clean and dry.  No erythema or signs of infection.  Lab Results: CBC: Recent Labs    07/01/22 0045 07/02/22 0238  WBC 21.2* 18.1*  HGB 7.4* 7.2*  HCT 20.6* 21.2*  PLT 139* 244   BMET:  Recent Labs    06/30/22 0455 07/01/22 0045  NA 129* 134*  K 4.4 3.8  CL 98 99  CO2 27 30  GLUCOSE 385* 177*  BUN 13 13  CREATININE 0.99 1.13  CALCIUM 8.0* 8.3*    PT/INR:  Lab Results  Component Value Date   INR 3.4 (H) 07/02/2022   INR 3.0 (H) 07/01/2022   INR 1.9 (H) 06/30/2022   ABG:  INR: Will add last result for INR, ABG once components are confirmed Will add last 4 CBG results once components are confirmed  Assessment/Plan:  1. CV - ST at times. On Lopressor 25 mg bid . IWill increase to 37.5 mg bid. Has On X aortic  valve so on Coumadin. INR slightly increased to 3.4 this am. He was given 2.5 mg of Coumadin on 04/03 (his last dose). He may need 1-2 mg daily. 2.  Pulmonary - On 2 liters of oxygen via [place]. Wean as able. Check PA/LAT CXR. Encourage incentive spirometer. 3. Volume Overload - On Lasix 40 mg daily. 4.  Expected post op acute blood loss anemia - H and H this am 7.2 and 21.2. Start Trinsicon 5. DM-CBGs 42/80/151. On Insulin but will decrease to avoid further hypoglycemia. Pre op HGA1C 7.9. He will need to follow up with PCP after discharge. 6. Thrombocytopenia resolved-platelets this am up to 244,000 7. LOC constipation  Donielle M ZimmermanPA-C 6:59 AM   Agree with above Trending INR Dispo planning  Shaday Rayborn O Rush Salce

## 2022-07-02 NOTE — Progress Notes (Signed)
ANTICOAGULATION CONSULT NOTE  Pharmacy Consult for warfarin Indication:  aortic valve replacement  No Known Allergies  Patient Measurements: Height: 6' (182.9 cm) Weight: 57.8 kg (127 lb 6.8 oz) IBW/kg (Calculated) : 77.6  Vital Signs: Temp: 99.7 F (37.6 C) (04/05 0759) Temp Source: Oral (04/05 0759) BP: 103/70 (04/05 0759) Pulse Rate: 115 (04/05 0759)  Labs: Recent Labs    06/29/22 1702 06/30/22 0455 07/01/22 0045 07/02/22 0100 07/02/22 0238  HGB 8.4* 7.1* 7.4*  --  7.2*  HCT 24.5* 21.3* 20.6*  --  21.2*  PLT 98* 104* 139*  --  244  LABPROT  --  21.9* 30.7* 33.7*  --   INR  --  1.9* 3.0* 3.4*  --   CREATININE 1.04 0.99 1.13  --   --     Estimated Creatinine Clearance: 64.6 mL/min (by C-G formula based on SCr of 1.13 mg/dL).  Medical History: Past Medical History:  Diagnosis Date   Alcoholic pancreatitis    s/p pancreatectomy   Diabetes mellitus with complication    H/O splenectomy     Medications:  Scheduled:   acetaminophen  1,000 mg Oral Q6H   Or   acetaminophen (TYLENOL) oral liquid 160 mg/5 mL  1,000 mg Per Tube Q6H   aspirin EC  81 mg Oral Daily   Or   aspirin  81 mg Per Tube Daily   atorvastatin  80 mg Oral Daily   bisacodyl  10 mg Oral Daily   Or   bisacodyl  10 mg Rectal Daily   Chlorhexidine Gluconate Cloth  6 each Topical Daily   docusate sodium  200 mg Oral Daily   famotidine  20 mg Oral Daily   Fe Fum-Vit C-Vit B12-FA  1 capsule Oral QPC breakfast   furosemide  40 mg Oral Daily   gabapentin  300 mg Oral TID   insulin aspart  0-15 Units Subcutaneous TID WC   insulin aspart  0-5 Units Subcutaneous QHS   insulin aspart  7 Units Subcutaneous TID WC   insulin glargine-yfgn  10 Units Subcutaneous BID   lactulose  20 g Oral Once   lipase/protease/amylase  24,000 Units Oral TID   methocarbamol  500 mg Oral TID   metoprolol tartrate  37.5 mg Oral BID   nicotine  14 mg Transdermal Daily   oxyCODONE  2.5 mg Oral Q6H   pantoprazole  40 mg  Oral Daily   potassium chloride  40 mEq Oral Daily   sodium chloride flush  3 mL Intravenous Q12H   sodium chloride flush  3 mL Intravenous Q12H   Warfarin - Pharmacist Dosing Inpatient   Does not apply q1600   Assessment: 47 y/o male presenting with chest pain, found to have a STEMI on 06/15/22. PMH of T2DM (insulin dependent s/p pancreatectomy), HTN, alcohol use disorder. 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 Pharmacy has been consulted to dose warfarin. Patient has no history of anticoagulation prior to admission.   -INR= 3.4 (last warfarin dose 4/3)  Goal of Therapy:  INR 2-3 for first three months, then goal will change to 1.5-2 (On-X prosthetic valve) Monitor platelets by anticoagulation protocol: Yes   Plan:  Hold warfarin this afternoon Check daily INR   Harland German, PharmD Clinical Pharmacist **Pharmacist phone directory can now be found on [email or web address] ([email or web address]).  Listed under [facility].

## 2022-07-03 ENCOUNTER — Inpatient Hospital Stay (HOSPITAL_COMMUNITY): Payer: Medicare Other

## 2022-07-03 LAB — PROTIME-INR
INR: 2.6 — ABNORMAL HIGH (ref 0.8–1.2)
Prothrombin Time: 27.7 seconds — ABNORMAL HIGH (ref 11.4–15.2)

## 2022-07-03 LAB — GLUCOSE, CAPILLARY
Glucose-Capillary: 62 mg/dL — ABNORMAL LOW (ref 70–99)
Glucose-Capillary: 91 mg/dL (ref 70–99)
Glucose-Capillary: 248 mg/dL — ABNORMAL HIGH (ref 70–99)
Glucose-Capillary: 94 mg/dL (ref 70–99)
Glucose-Capillary: 167 mg/dL — ABNORMAL HIGH (ref 70–99)
Glucose-Capillary: 48 mg/dL — ABNORMAL LOW (ref 70–99)

## 2022-07-03 LAB — CBC
HCT: 18 % — ABNORMAL LOW (ref 39.0–52.0)
Hemoglobin: 6.2 g/dL — CL (ref 13.0–17.0)
MCH: 33.2 pg (ref 26.0–34.0)
MCHC: 34.4 g/dL (ref 30.0–36.0)
MCV: 96.3 fL (ref 80.0–100.0)
Platelets: 325 10*3/uL (ref 150–400)
RBC: 1.87 MIL/uL — ABNORMAL LOW (ref 4.22–5.81)
RDW: 13.6 % (ref 11.5–15.5)
WBC: 16.1 10*3/uL — ABNORMAL HIGH (ref 4.0–10.5)
nRBC: 0.2 % (ref 0.0–0.2)

## 2022-07-03 LAB — HEMOGLOBIN AND HEMATOCRIT, BLOOD
HCT: 21.3 % — ABNORMAL LOW (ref 39.0–52.0)
Hemoglobin: 7.1 g/dL — ABNORMAL LOW (ref 13.0–17.0)

## 2022-07-03 MED ORDER — WARFARIN SODIUM 1 MG PO TABS
1.0000 mg | ORAL_TABLET | Freq: Once | ORAL | Status: AC
  Administered 2022-07-03: 1 mg via ORAL
  Filled 2022-07-03: qty 1

## 2022-07-03 MED ORDER — INSULIN GLARGINE-YFGN 100 UNIT/ML ~~LOC~~ SOLN
10.0000 [IU] | Freq: Every day | SUBCUTANEOUS | Status: DC
  Administered 2022-07-03 – 2022-07-04 (×2): 10 [IU] via SUBCUTANEOUS
  Filled 2022-07-03 (×3): qty 0.1

## 2022-07-03 MED ORDER — INSULIN ASPART 100 UNIT/ML IJ SOLN
5.0000 [IU] | Freq: Three times a day (TID) | INTRAMUSCULAR | Status: DC
  Administered 2022-07-03 – 2022-07-05 (×5): 5 [IU] via SUBCUTANEOUS

## 2022-07-03 MED ORDER — WARFARIN SODIUM 2.5 MG PO TABS: 2.5000 mg | ORAL_TABLET | Freq: Once | ORAL | Status: DC

## 2022-07-03 NOTE — Progress Notes (Addendum)
ANTICOAGULATION CONSULT NOTE  Pharmacy Consult for warfarin Indication:  aortic valve replacement  No Known Allergies  Patient Measurements: Height: 6' (182.9 cm) Weight: 56.8 kg (125 lb 4.8 oz) IBW/kg (Calculated) : 77.6  Vital Signs: Temp: 97.7 F (36.5 C) (04/06 0721) Temp Source: Oral (04/06 0721) BP: 94/55 (04/06 0721) Pulse Rate: 96 (04/06 0721)  Labs: Recent Labs    07/01/22 0045 07/02/22 0100 07/02/22 0238 07/03/22 0101  HGB 7.4*  --  7.2* 6.2*  HCT 20.6*  --  21.2* 18.0*  PLT 139*  --  244 325  LABPROT 30.7* 33.7*  --  27.7*  INR 3.0* 3.4*  --  2.6*  CREATININE 1.13  --   --   --    Estimated Creatinine Clearance: 63.5 mL/min (by C-G formula based on SCr of 1.13 mg/dL).  Medical History: Past Medical History:  Diagnosis Date   Alcoholic pancreatitis    s/p pancreatectomy   Diabetes mellitus with complication    H/O splenectomy     Medications:  Scheduled:   acetaminophen  1,000 mg Oral Q6H   Or   acetaminophen (TYLENOL) oral liquid 160 mg/5 mL  1,000 mg Per Tube Q6H   atorvastatin  80 mg Oral Daily   bisacodyl  10 mg Oral Daily   Or   bisacodyl  10 mg Rectal Daily   docusate sodium  200 mg Oral Daily   famotidine  20 mg Oral Daily   Fe Fum-Vit C-Vit B12-FA  1 capsule Oral QPC breakfast   gabapentin  300 mg Oral TID   insulin aspart  0-15 Units Subcutaneous TID WC   insulin aspart  0-5 Units Subcutaneous QHS   insulin aspart  5 Units Subcutaneous TID WC   insulin glargine-yfgn  10 Units Subcutaneous QHS   lactulose  20 g Oral Once   lipase/protease/amylase  24,000 Units Oral TID   methocarbamol  500 mg Oral TID   metoprolol tartrate  37.5 mg Oral BID   nicotine  14 mg Transdermal Daily   oxyCODONE  2.5 mg Oral Q6H   pantoprazole  40 mg Oral Daily   warfarin  1 mg Oral ONCE-1600   Warfarin - Pharmacist Dosing Inpatient   Does not apply q1600   Assessment: 47 y/o male presenting with chest pain, found to have a STEMI on 06/15/22. PMH of  T2DM (insulin dependent s/p pancreatectomy), HTN, alcohol use disorder. 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 Pharmacy has been consulted to dose warfarin. Patient has no history of anticoagulation prior to admission.   INR= 2.6 therapeutic today. Last warfarin dose 4/3). Hgb this AM 6.2. No reported bleeding from RN or from CT surg evaluation. Per discussion with CT surg, plan to continue therapeutic warfarin dosing. Will obtain occult blood today and watch for s/sx bleed.   Goal of Therapy:  INR 2-3 for first three months, then goal will change to 1.5-2 (On-X prosthetic valve) Monitor platelets by anticoagulation protocol: Yes   Plan:  Warfarin 1 mg x1  Check daily INR  Trend H&H   Jerry Caras, PharmD PGY1 Pharmacy Resident   07/03/2022 9:27 AM

## 2022-07-03 NOTE — Plan of Care (Signed)
  Problem: Education: Goal: Knowledge of General Education information will improve Description: Including pain rating scale, medication(s)/side effects and non-pharmacologic comfort measures Outcome: Progressing   Problem: Activity: Goal: Risk for activity intolerance will decrease Outcome: Progressing   Problem: Nutrition: Goal: Adequate nutrition will be maintained Outcome: Progressing   

## 2022-07-03 NOTE — Progress Notes (Addendum)
      [address]       [place] [address]             [phone]      5 Days Post-Op Procedure(s) (LRB): CORONARY ARTERY BYPASS GRAFTING (CABG) X2 USING LEFT INTERNAL MAMMARY ARTERY AND RIGHT GREATER SAPHENOUS VEIN HARVESTED ENDOSCOPICALLY (N/A) AORTIC VALVE REPLACEMENT (AVR) USING ON-X PROSTHETIC HEART VALVE SIZE (N/A) TRANSESOPHAGEAL ECHOCARDIOGRAM (N/A)  Subjective:  Patient without complaints.  He is hoping to go home soon.  He moved his bowels yesterday and denies bloody stools or black tarry stools.    Objective: Vital signs in last 24 hours: Temp:  [97.7 F (36.5 C)-99.7 F (37.6 C)] 97.7 F (36.5 C) (04/06 0721) Pulse Rate:  [86-103] 96 (04/06 0721) Cardiac Rhythm: Normal sinus rhythm (04/05 1955) Resp:  [16-20] 18 (04/06 0721) BP: (94-102)/(55-61) 94/55 (04/06 0721) SpO2:  [93 %-97 %] 93 % (04/06 0721) Weight:  [56.8 kg] 56.8 kg (04/06 0214)  Intake/Output from previous day: 04/05 0701 - 04/06 0700 In: 600 [P.O.:600] Out: -   General appearance: alert, cooperative, and no distress Heart: regular rate and rhythm Lungs: diminished breath sounds left base Abdomen: soft, non-tender; bowel sounds normal; no masses,  no organomegaly Extremities: extremities normal, atraumatic, no cyanosis or edema Wound: clean and dry  Lab Results: Recent Labs    07/02/22 0238 07/03/22 0101  WBC 18.1* 16.1*  HGB 7.2* 6.2*  HCT 21.2* 18.0*  PLT 244 325   BMET:  Recent Labs    07/01/22 0045  NA 134*  K 3.8  CL 99  CO2 30  GLUCOSE 177*  BUN 13  CREATININE 1.13  CALCIUM 8.3*    PT/INR:  Recent Labs    07/03/22 0101  LABPROT 27.7*  INR 2.6*   ABG    Component Value Date/Time   PHART 7.323 (L) 06/29/2022 0019   HCO3 23.3 06/29/2022 0019   TCO2 25 06/29/2022 0019   ACIDBASEDEF 3.0 (H) 06/29/2022 0019   O2SAT 97 06/29/2022 0019   CBG (last 3)  Recent Labs    07/02/22 2119 07/03/22 0554 07/03/22 0634  GLUCAP 193* 62* 91     Assessment/Plan: S/P Procedure(s) (LRB): CORONARY ARTERY BYPASS GRAFTING (CABG) X2 USING LEFT INTERNAL MAMMARY ARTERY AND RIGHT GREATER SAPHENOUS VEIN HARVESTED ENDOSCOPICALLY (N/A) AORTIC VALVE REPLACEMENT (AVR) USING ON-X PROSTHETIC HEART VALVE SIZE (N/A) TRANSESOPHAGEAL ECHOCARDIOGRAM (N/A)  CV- NSR, + hypotensive this morning- on Lopressor 37.5 mg BID INR down to 2.6, coumadin has been held, will resume at 1 mg daily as discussed with Dr. Orma Render- CXR with trace left pleural effusions, no pneumothorax, not requiring oxygen continue IS Renal- creatinine has been stable, patient is below admission weight, he has no edema on exam, stop lasix and potassium Expected post operative anemia- Hgb had been stable at 7.2, dropped to 6.2, unclear etiology... patient denies bloody BM.Marland Kitchen will check occult stool, repeat H/H... if accurate will tranfuse 2 units of packed cells DM- hypoglycemic at times, will adjust patient's insulin regimen Dispo- patient stable, will resume coumadin at lower dose for mechanical AVR, stop diuretics, ? Source of drop in hemoglobin... will ensure no evidence of GI bleeding, if H/H accurate will transfuse...   Patient seen and examined, agree with above Repeat Hgb  Viviann Spare C. Dorris Fetch, MD [facility] [phone]   LOS: 18 days    Lowella Dandy, PA-C 07/03/2022

## 2022-07-03 NOTE — Progress Notes (Signed)
Repeat CBG 91

## 2022-07-03 NOTE — Progress Notes (Signed)
CBG 62 O.J. given no complaints voiced

## 2022-07-03 NOTE — Progress Notes (Signed)
Lab called with [facility]. 6.2. We have orders to call M.D. for [facility] less than 6. Tim R.N. aware.

## 2022-07-03 NOTE — Progress Notes (Signed)
CARDIAC REHAB PHASE I   Pt resting in bed, feeling ok this morning.Pt is eager for discharge home. Pt ambulating independently, reports tolerating well. Post OHS education including site care, restrictions, risk factors, heart healthy diabetic diet, sternal precautions, home needs at discharge, IS use at home and CRP2 reviewed. All questions and concerns addressed. Pt does not require DME for home. Will refer to [facility] for CRP2. Will continue to follow.   [id number]    Woodroe Chen, RN BSN 07/03/2022 9:52 AM

## 2022-07-04 LAB — GLUCOSE, CAPILLARY
Glucose-Capillary: 232 mg/dL — ABNORMAL HIGH (ref 70–99)
Glucose-Capillary: 160 mg/dL — ABNORMAL HIGH (ref 70–99)
Glucose-Capillary: 136 mg/dL — ABNORMAL HIGH (ref 70–99)
Glucose-Capillary: 273 mg/dL — ABNORMAL HIGH (ref 70–99)

## 2022-07-04 LAB — AEROBIC/ANAEROBIC CULTURE W GRAM STAIN (SURGICAL/DEEP WOUND)
Culture: NO GROWTH
Gram Stain: NONE SEEN

## 2022-07-04 LAB — PROTIME-INR
INR: 2.3 — ABNORMAL HIGH (ref 0.8–1.2)
Prothrombin Time: 24.8 seconds — ABNORMAL HIGH (ref 11.4–15.2)

## 2022-07-04 MED ORDER — WARFARIN SODIUM 1 MG PO TABS: 1.0000 mg | ORAL_TABLET | Freq: Once | ORAL | Status: DC

## 2022-07-04 MED ORDER — WARFARIN SODIUM 2.5 MG PO TABS
2.5000 mg | ORAL_TABLET | Freq: Once | ORAL | Status: AC
  Administered 2022-07-04: 2.5 mg via ORAL
  Filled 2022-07-04: qty 1

## 2022-07-04 MED ORDER — ACETAMINOPHEN 500 MG PO TABS
1000.0000 mg | ORAL_TABLET | Freq: Four times a day (QID) | ORAL | Status: DC
  Administered 2022-07-04 – 2022-07-05 (×3): 1000 mg via ORAL
  Filled 2022-07-04 (×4): qty 2

## 2022-07-04 MED ORDER — METOPROLOL TARTRATE 25 MG PO TABS
25.0000 mg | ORAL_TABLET | Freq: Two times a day (BID) | ORAL | Status: DC
  Administered 2022-07-04 (×2): 25 mg via ORAL
  Filled 2022-07-04 (×2): qty 1

## 2022-07-04 MED ORDER — OXYCODONE HCL 5 MG PO TABS
5.0000 mg | ORAL_TABLET | ORAL | Status: DC | PRN
  Administered 2022-07-04 – 2022-07-05 (×6): 10 mg via ORAL
  Filled 2022-07-04 (×6): qty 2

## 2022-07-04 NOTE — Progress Notes (Addendum)
      [address]       [place] [address]             [phone]      6 Days Post-Op Procedure(s) (LRB): CORONARY ARTERY BYPASS GRAFTING (CABG) X2 USING LEFT INTERNAL MAMMARY ARTERY AND RIGHT GREATER SAPHENOUS VEIN HARVESTED ENDOSCOPICALLY (N/A) AORTIC VALVE REPLACEMENT (AVR) USING ON-X PROSTHETIC HEART VALVE SIZE (N/A) TRANSESOPHAGEAL ECHOCARDIOGRAM (N/A)  Subjective:  Patient wants to go home.  Otherwise has no complaints.   Objective: Vital signs in last 24 hours: Temp:  [98.3 F (36.8 C)-98.9 F (37.2 C)] 98.5 F (36.9 C) (04/07 0618) Pulse Rate:  [74-94] 94 (04/07 0618) Cardiac Rhythm: Normal sinus rhythm (04/06 2041) Resp:  [11-17] 17 (04/07 0618) BP: (88-107)/(55-65) 103/63 (04/07 0618) SpO2:  [96 %-99 %] 96 % (04/07 0618) Weight:  [56.1 kg] 56.1 kg (04/07 0618)  General appearance: alert, cooperative, and no distress Heart: regular rate and rhythm Lungs: clear to auscultation bilaterally Abdomen: soft, non-tender; bowel sounds normal; no masses,  no organomegaly Extremities: edema none present Wound: clean and dry  Lab Results: Recent Labs    07/02/22 0238 07/03/22 0101 07/03/22 1145  WBC 18.1* 16.1*  --   HGB 7.2* 6.2* 7.1*  HCT 21.2* 18.0* 21.3*  PLT 244 325  --    BMET: No results for input(s): "NA", "K", "CL", "CO2", "GLUCOSE", "BUN", "CREATININE", "CALCIUM" in the last 72 hours.  PT/INR:  Recent Labs    07/04/22 0105  LABPROT 24.8*  INR 2.3*   ABG    Component Value Date/Time   PHART 7.323 (L) 06/29/2022 0019   HCO3 23.3 06/29/2022 0019   TCO2 25 06/29/2022 0019   ACIDBASEDEF 3.0 (H) 06/29/2022 0019   O2SAT 97 06/29/2022 0019   CBG (last 3)  Recent Labs    07/03/22 2039 07/03/22 2124 07/04/22 0616  GLUCAP 48* 167* 232*    Assessment/Plan: S/P Procedure(s) (LRB): CORONARY ARTERY BYPASS GRAFTING (CABG) X2 USING LEFT INTERNAL MAMMARY ARTERY AND RIGHT GREATER SAPHENOUS VEIN HARVESTED ENDOSCOPICALLY  (N/A) AORTIC VALVE REPLACEMENT (AVR) USING ON-X PROSTHETIC HEART VALVE SIZE (N/A) TRANSESOPHAGEAL ECHOCARDIOGRAM (N/A)  NSR, + Hypotension- Lopressor has been decreased to 25 mg BID with parameters in place, however with review of MAR.. patient has not been receiving doses due to low BP INR 2.3, he received 2.5 mg dose last night, will repeat today Pulm- no acute issues, good oxygenation, continue IS Post operative anemia- repeat H/H showed stable Hgb at 7.1, continue iron supplementation, no signs of active bleeding DM- continue current insulin regimen Dispo- patient stable, patient wants to go home... INR is at 2.3 today, he received 1 mg dose last night.. will discuss dispo with Dr. Dorris Fetch   LOS: 19 days    Lowella Dandy, PA-C 07/04/2022 Patient seen and examined, agree with above He is unhappy and threatening to leave AMA Unhappy with q6 pain med dosing- pain recurring after 3-4 hours Will change to q4 PRN Hopefully will stay as we have recommended.  We discussed the medical risks of leaving AMA with uncertain coumadin dose in setting of mechanical AVR.  Salvatore Decent Dorris Fetch, MD [facility] [phone]

## 2022-07-04 NOTE — Progress Notes (Addendum)
ANTICOAGULATION CONSULT NOTE  Pharmacy Consult for warfarin Indication:  aortic valve replacement  No Known Allergies  Patient Measurements: Height: 6' (182.9 cm) Weight: 56.1 kg (123 lb 11.2 oz) IBW/kg (Calculated) : 77.6  Vital Signs: Temp: 98.5 F (36.9 C) (04/07 0618) Temp Source: Oral (04/07 0618) BP: 103/63 (04/07 0618) Pulse Rate: 94 (04/07 0618)  Labs: Recent Labs    07/02/22 0100 07/02/22 0238 07/02/22 0238 07/03/22 0101 07/03/22 1145 07/04/22 0105  HGB  --  7.2*   < > 6.2* 7.1*  --   HCT  --  21.2*  --  18.0* 21.3*  --   PLT  --  244  --  325  --   --   LABPROT 33.7*  --   --  27.7*  --  24.8*  INR 3.4*  --   --  2.6*  --  2.3*   < > = values in this interval not displayed.   Estimated Creatinine Clearance: 62.7 mL/min (by C-G formula based on SCr of 1.13 mg/dL).  Medical History: Past Medical History:  Diagnosis Date   Alcoholic pancreatitis    s/p pancreatectomy   Diabetes mellitus with complication    H/O splenectomy     Medications:  Scheduled:   atorvastatin  80 mg Oral Daily   bisacodyl  10 mg Oral Daily   Or   bisacodyl  10 mg Rectal Daily   docusate sodium  200 mg Oral Daily   famotidine  20 mg Oral Daily   Fe Fum-Vit C-Vit B12-FA  1 capsule Oral QPC breakfast   gabapentin  300 mg Oral TID   insulin aspart  0-15 Units Subcutaneous TID WC   insulin aspart  0-5 Units Subcutaneous QHS   insulin aspart  5 Units Subcutaneous TID WC   insulin glargine-yfgn  10 Units Subcutaneous QHS   lactulose  20 g Oral Once   lipase/protease/amylase  24,000 Units Oral TID   methocarbamol  500 mg Oral TID   metoprolol tartrate  25 mg Oral BID   nicotine  14 mg Transdermal Daily   oxyCODONE  2.5 mg Oral Q6H   pantoprazole  40 mg Oral Daily   warfarin  1 mg Oral ONCE-1600   Warfarin - Pharmacist Dosing Inpatient   Does not apply q1600   Assessment: 47 y/o male presenting with chest pain, found to have a STEMI on 06/15/22. PMH of T2DM (insulin dependent  s/p pancreatectomy), HTN, alcohol use disorder. 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 Patient has no history of anticoagulation prior to admission. Pharmacy has been consulted to dose warfarin.   INR = 2.3; therapeutic today. Decreased from INR of 2.6 yesterday. Last warfarin dose 4/3. Hgb this AM 7.1 improved from yesterday. Platelets 325 improved as well. No s/sx bleeding noted. CT surg plan for warfarin 2.5 mg x1 today. Pharmacy will continue to follow plan for warfarin.   Goal of Therapy:  INR 2-3 for first three months, then goal will change to 1.5-2 (On-X prosthetic valve) Monitor platelets by anticoagulation protocol: Yes   Plan:  Warfarin 2.5 mg x1 today  Check daily INR  Trend H&H   Jerry Caras, PharmD PGY1 Pharmacy Resident   07/04/2022 8:21 AM

## 2022-07-05 LAB — GLUCOSE, CAPILLARY
Glucose-Capillary: 193 mg/dL — ABNORMAL HIGH (ref 70–99)
Glucose-Capillary: 218 mg/dL — ABNORMAL HIGH (ref 70–99)

## 2022-07-05 LAB — PROTIME-INR
INR: 2.1 — ABNORMAL HIGH (ref 0.8–1.2)
Prothrombin Time: 23.2 seconds — ABNORMAL HIGH (ref 11.4–15.2)

## 2022-07-05 MED ORDER — OXYCODONE HCL 5 MG PO TABS: 5.0000 mg | ORAL_TABLET | Freq: Four times a day (QID) | ORAL | 0 refills | Status: DC | PRN

## 2022-07-05 MED ORDER — FUROSEMIDE 20 MG PO TABS: 20.0000 mg | ORAL_TABLET | Freq: Two times a day (BID) | ORAL | Status: DC

## 2022-07-05 MED ORDER — METOPROLOL TARTRATE 25 MG PO TABS: 25.0000 mg | ORAL_TABLET | Freq: Two times a day (BID) | ORAL | 1 refills | Status: DC

## 2022-07-05 MED ORDER — NICOTINE 14 MG/24HR TD PT24: 14.0000 mg | MEDICATED_PATCH | Freq: Every day | TRANSDERMAL | 0 refills | Status: DC

## 2022-07-05 MED ORDER — POTASSIUM CHLORIDE CRYS ER 20 MEQ PO TBCR: 20.0000 meq | EXTENDED_RELEASE_TABLET | Freq: Every day | ORAL | Status: DC

## 2022-07-05 MED ORDER — WARFARIN SODIUM 2.5 MG PO TABS: 2.5000 mg | ORAL_TABLET | Freq: Every day | ORAL | 1 refills | Status: DC

## 2022-07-05 MED ORDER — FUROSEMIDE 40 MG PO TABS: 40.0000 mg | ORAL_TABLET | Freq: Every day | ORAL | Status: DC

## 2022-07-05 MED ORDER — GABAPENTIN 300 MG PO CAPS: 300.0000 mg | ORAL_CAPSULE | Freq: Three times a day (TID) | ORAL | 0 refills | Status: DC

## 2022-07-05 MED ORDER — METHOCARBAMOL 500 MG PO TABS: 500.0000 mg | ORAL_TABLET | Freq: Three times a day (TID) | ORAL | 0 refills | Status: DC | PRN

## 2022-07-05 MED ORDER — WARFARIN SODIUM 2.5 MG PO TABS: 2.5000 mg | ORAL_TABLET | Freq: Every day | ORAL | Status: DC

## 2022-07-05 MED ORDER — ATORVASTATIN CALCIUM 80 MG PO TABS: 80.0000 mg | ORAL_TABLET | Freq: Every day | ORAL | 1 refills | Status: DC

## 2022-07-05 NOTE — Progress Notes (Signed)
      [address]       [place] [address]             [phone]        7 Days Post-Op Procedure(s) (LRB): CORONARY ARTERY BYPASS GRAFTING (CABG) X2 USING LEFT INTERNAL MAMMARY ARTERY AND RIGHT GREATER SAPHENOUS VEIN HARVESTED ENDOSCOPICALLY (N/A) AORTIC VALVE REPLACEMENT (AVR) USING ON-X PROSTHETIC HEART VALVE SIZE (N/A) TRANSESOPHAGEAL ECHOCARDIOGRAM (N/A)  Subjective: Patient without complaints this am. Looking forward to going home.  Objective: Vital signs in last 24 hours: Temp:  [97.9 F (36.6 C)-99.3 F (37.4 C)] 97.9 F (36.6 C) (04/08 0530) Pulse Rate:  [69-93] 69 (04/08 0603) Cardiac Rhythm: Normal sinus rhythm (04/07 1900) Resp:  [11-20] 11 (04/08 0603) BP: (91-109)/(51-89) 109/89 (04/08 0603) SpO2:  [94 %-100 %] 100 % (04/08 0603) Weight:  [55 kg] 55 kg (04/08 0530)  Pre op weight 53.1 kg Current Weight  07/05/22 55 kg       Intake/Output from previous day: 04/07 0701 - 04/08 0700 In: 240 [P.O.:240] Out: -    Physical Exam:  Cardiovascular: RRR Pulmonary: Clear to auscultation bilaterally Abdomen: Soft, non tender, bowel sounds present. Extremities: No lower extremity edema. Wounds: Clean and dry.  No erythema or signs of infection.  Lab Results: CBC: Recent Labs    07/03/22 0101 07/03/22 1145  WBC 16.1*  --   HGB 6.2* 7.1*  HCT 18.0* 21.3*  PLT 325  --     BMET:  No results for input(s): "NA", "K", "CL", "CO2", "GLUCOSE", "BUN", "CREATININE", "CALCIUM" in the last 72 hours.   PT/INR:  Lab Results  Component Value Date   INR 2.1 (H) 07/05/2022   INR 2.3 (H) 07/04/2022   INR 2.6 (H) 07/03/2022   ABG:  INR: Will add last result for INR, ABG once components are confirmed Will add last 4 CBG results once components are confirmed  Assessment/Plan:  1. CV - SR. On Lopressor 25 mg bid. Has On X aortic valve so on Coumadin. INR this am 2.1. Will continue with 2.5 mg of Coumadin. 2.  Pulmonary - On room air.  Encourage incentive spirometer. 3. Volume Overload - On Lasix 40 mg daily. 4.  Expected post op acute blood loss anemia - H and H this am 7.2 and 21.2. Start Trinsicon 5. DM-CBGs 136/273/193. On Insulin. Pre op HGA1C 7.9. He will need to follow up with PCP after discharge. 6. Discharge once have PT/INR appointment  Yamilett Anastos M ZimmermanPA-C 6:59 AM

## 2022-07-05 NOTE — Progress Notes (Signed)
CARDIAC REHAB PHASE I   Plans for discharge today. Post [facility] education completed. Referral sent to [facility] for CRP2.   [id number] Woodroe Chen, RN BSN 07/05/2022 10:39 AM

## 2022-07-05 NOTE — Plan of Care (Signed)
  Problem: Health Behavior/Discharge Planning: Goal: Ability to manage health-related needs will improve Outcome: Progressing   Problem: Clinical Measurements: Goal: Ability to maintain clinical measurements within normal limits will improve Outcome: Progressing   Problem: Activity: Goal: Risk for activity intolerance will decrease Outcome: Progressing   Problem: Activity: Goal: Ability to return to baseline activity level will improve Outcome: Progressing   Problem: Health Behavior/Discharge Planning: Goal: Ability to safely manage health-related needs after discharge will improve Outcome: Progressing   Problem: Education: Goal: Ability to describe self-care measures that may prevent or decrease complications (Diabetes Survival Skills Education) will improve Outcome: Progressing   Problem: Health Behavior/Discharge Planning: Goal: Ability to identify and utilize available resources and services will improve Outcome: Progressing Goal: Ability to manage health-related needs will improve Outcome: Progressing   Problem: Metabolic: Goal: Ability to maintain appropriate glucose levels will improve Outcome: Progressing

## 2022-07-05 NOTE — Care Management Important Message (Signed)
Important Message  Patient Details  Name: Ian Gross MRN: [id number] Date of Birth: [birth year]   Medicare Important Message Given:  Yes     Renie Ora 07/05/2022, 11:01 AM

## 2022-07-05 NOTE — Care Management Important Message (Signed)
Important Message  Patient Details  Name: Ian Gross MRN: [id number] Date of Birth: 06-26-1975   Medicare Important Message Given:  Yes     Renie Ora 07/05/2022, 11:44 AM

## 2022-07-05 NOTE — Progress Notes (Signed)
Discharge instructions (including medications) discussed with and copy provided to patient/caregiver 

## 2022-07-05 NOTE — Plan of Care (Signed)
Problem: Education: Goal: Knowledge of General Education information will improve Description: Including pain rating scale, medication(s)/side effects and non-pharmacologic comfort measures Outcome: Adequate for Discharge   Problem: Health Behavior/Discharge Planning: Goal: Ability to manage health-related needs will improve Outcome: Adequate for Discharge   Problem: Clinical Measurements: Goal: Ability to maintain clinical measurements within normal limits will improve Outcome: Adequate for Discharge Goal: Will remain free from infection Outcome: Adequate for Discharge Goal: Diagnostic test results will improve Outcome: Adequate for Discharge Goal: Respiratory complications will improve Outcome: Adequate for Discharge Goal: Cardiovascular complication will be avoided Outcome: Adequate for Discharge   Problem: Activity: Goal: Risk for activity intolerance will decrease Outcome: Adequate for Discharge   Problem: Nutrition: Goal: Adequate nutrition will be maintained Outcome: Adequate for Discharge   Problem: Coping: Goal: Level of anxiety will decrease Outcome: Adequate for Discharge   Problem: Elimination: Goal: Will not experience complications related to bowel motility Outcome: Adequate for Discharge Goal: Will not experience complications related to urinary retention Outcome: Adequate for Discharge   Problem: Pain Managment: Goal: General experience of comfort will improve Outcome: Adequate for Discharge   Problem: Safety: Goal: Ability to remain free from injury will improve Outcome: Adequate for Discharge   Problem: Skin Integrity: Goal: Risk for impaired skin integrity will decrease Outcome: Adequate for Discharge   Problem: Education: Goal: Understanding of CV disease, CV risk reduction, and recovery process will improve Outcome: Adequate for Discharge Goal: Individualized Educational Video(s) Outcome: Adequate for Discharge   Problem:  Activity: Goal: Ability to return to baseline activity level will improve Outcome: Adequate for Discharge   Problem: Cardiovascular: Goal: Ability to achieve and maintain adequate cardiovascular perfusion will improve Outcome: Adequate for Discharge Goal: Vascular access site(s) Level 0-1 will be maintained Outcome: Adequate for Discharge   Problem: Health Behavior/Discharge Planning: Goal: Ability to safely manage health-related needs after discharge will improve Outcome: Adequate for Discharge   Problem: Education: Goal: Understanding of cardiac disease, CV risk reduction, and recovery process will improve Outcome: Adequate for Discharge Goal: Individualized Educational Video(s) Outcome: Adequate for Discharge   Problem: Activity: Goal: Ability to tolerate increased activity will improve Outcome: Adequate for Discharge   Problem: Cardiac: Goal: Ability to achieve and maintain adequate cardiovascular perfusion will improve Outcome: Adequate for Discharge   Problem: Health Behavior/Discharge Planning: Goal: Ability to safely manage health-related needs after discharge will improve Outcome: Adequate for Discharge   Problem: Education: Goal: Ability to describe self-care measures that may prevent or decrease complications (Diabetes Survival Skills Education) will improve Outcome: Adequate for Discharge Goal: Individualized Educational Video(s) Outcome: Adequate for Discharge   Problem: Coping: Goal: Ability to adjust to condition or change in health will improve Outcome: Adequate for Discharge   Problem: Fluid Volume: Goal: Ability to maintain a balanced intake and output will improve Outcome: Adequate for Discharge   Problem: Health Behavior/Discharge Planning: Goal: Ability to identify and utilize available resources and services will improve Outcome: Adequate for Discharge Goal: Ability to manage health-related needs will improve Outcome: Adequate for Discharge    Problem: Metabolic: Goal: Ability to maintain appropriate glucose levels will improve Outcome: Adequate for Discharge   Problem: Nutritional: Goal: Maintenance of adequate nutrition will improve Outcome: Adequate for Discharge Goal: Progress toward achieving an optimal weight will improve Outcome: Adequate for Discharge   Problem: Skin Integrity: Goal: Risk for impaired skin integrity will decrease Outcome: Adequate for Discharge   Problem: Tissue Perfusion: Goal: Adequacy of tissue perfusion will improve Outcome: Adequate for Discharge  Problem: Education: Goal: Will demonstrate proper wound care and an understanding of methods to prevent future damage Outcome: Adequate for Discharge Goal: Knowledge of disease or condition will improve Outcome: Adequate for Discharge Goal: Knowledge of the prescribed therapeutic regimen will improve Outcome: Adequate for Discharge Goal: Individualized Educational Video(s) Outcome: Adequate for Discharge   Problem: Activity: Goal: Risk for activity intolerance will decrease Outcome: Adequate for Discharge   Problem: Cardiac: Goal: Will achieve and/or maintain hemodynamic stability Outcome: Adequate for Discharge   Problem: Clinical Measurements: Goal: Postoperative complications will be avoided or minimized Outcome: Adequate for Discharge   Problem: Respiratory: Goal: Respiratory status will improve Outcome: Adequate for Discharge

## 2022-07-05 NOTE — TOC Transition Note (Signed)
Transition of Care (TOC) - CM/SW Discharge Note Donn Pierini RN, BSN Transitions of Care Unit 4E- RN Case Manager See Treatment Team for direct phone #   Patient Details  Name: Ian Gross MRN: [id number] Date of Birth: 15-Jun-1975  Transition of Care [facility]) CM/SW Contact:  Darrold Span, RN Phone Number: 07/05/2022, 11:46 AM   Clinical Narrative:    Pt stable for transition home today, s/p CABG. CM has been notified by [facility] that they have TCTS office protocol for [facility] needs and will follow up post discharge with pt at home.   CM has notified [facility] liaison for discharge and start of care.   Pt has transport home, no further TOC needs noted.    Final next level of care: Home w Home Health Services Barriers to Discharge: Barriers Resolved   Patient Goals and CMS Choice    TCTS office protocol referral  Discharge Placement               Home w/ [facility]          Discharge Plan and Services Additional resources added to the After Visit Summary for     Discharge Planning Services: CM Consult Post Acute Care Choice: Home Health            DME Agency: NA       HH Arranged: RN, PT, OT [facility] Agency: [facility] Date [facility] Agency Contacted: 07/05/22 Time HH Agency Contacted: 1020 Representative spoke with at [facility] Agency: Bjorn Loser  Social Determinants of Health (SDOH) Interventions SDOH Screenings   Food Insecurity: No Food Insecurity (09/03/2021)  Housing: Low Risk  (09/03/2021)  Transportation Needs: No Transportation Needs (09/03/2021)  Alcohol Screen: Low Risk  (06/02/2021)  Depression (PHQ2-9): Medium Risk (06/02/2021)  Financial Resource Strain: Low Risk  (09/03/2021)  Social Connections: Moderately Isolated (09/03/2021)  Stress: No Stress Concern Present (09/03/2021)  Tobacco Use: High Risk (06/28/2022)     Readmission Risk Interventions    07/05/2022   11:45 AM  Readmission Risk Prevention Plan  Transportation Screening Complete  PCP or Specialist Appt  within 5-7 Days Complete  Home Care Screening Complete  Medication Review (RN CM) Complete

## 2022-07-06 ENCOUNTER — Telehealth: Payer: Self-pay

## 2022-07-06 ENCOUNTER — Other Ambulatory Visit: Payer: Self-pay | Admitting: *Deleted

## 2022-07-06 DIAGNOSIS — Z90410 Acquired total absence of pancreas: Secondary | ICD-10-CM | POA: Diagnosis not present

## 2022-07-06 DIAGNOSIS — D62 Acute posthemorrhagic anemia: Secondary | ICD-10-CM | POA: Diagnosis not present

## 2022-07-06 DIAGNOSIS — Z48812 Encounter for surgical aftercare following surgery on the circulatory system: Secondary | ICD-10-CM | POA: Diagnosis not present

## 2022-07-06 DIAGNOSIS — I2510 Atherosclerotic heart disease of native coronary artery without angina pectoris: Secondary | ICD-10-CM | POA: Diagnosis not present

## 2022-07-06 DIAGNOSIS — Z79891 Long term (current) use of opiate analgesic: Secondary | ICD-10-CM | POA: Diagnosis not present

## 2022-07-06 DIAGNOSIS — Z794 Long term (current) use of insulin: Secondary | ICD-10-CM | POA: Diagnosis not present

## 2022-07-06 DIAGNOSIS — Z9081 Acquired absence of spleen: Secondary | ICD-10-CM | POA: Diagnosis not present

## 2022-07-06 DIAGNOSIS — E119 Type 2 diabetes mellitus without complications: Secondary | ICD-10-CM | POA: Diagnosis not present

## 2022-07-06 DIAGNOSIS — G8929 Other chronic pain: Secondary | ICD-10-CM | POA: Diagnosis not present

## 2022-07-06 DIAGNOSIS — I214 Non-ST elevation (NSTEMI) myocardial infarction: Secondary | ICD-10-CM | POA: Diagnosis not present

## 2022-07-06 DIAGNOSIS — Z952 Presence of prosthetic heart valve: Secondary | ICD-10-CM | POA: Diagnosis not present

## 2022-07-06 NOTE — Transitions of Care (Post Inpatient/ED Visit) (Signed)
   07/06/2022  Name: Ian Gross MRN: [id number] DOB: June 29, 1975  Today's TOC FU Call Status: Today's TOC FU Call Status:: Unsuccessul Call (1st Attempt) Unsuccessful Call (1st Attempt) Date: 07/06/22  Attempted to reach the patient regarding the most recent Inpatient/ED visit. Spoke briefly with patient-he voices things going well. States that [facility] RN currently in the home with him at present. Advised that another [facility] outreach to follow up post discharge would be made to him at a later time. He voiced understanding.  Follow Up Plan: Additional outreach attempts will be made to reach the patient to complete the Transitions of Care (Post Inpatient/ED visit) call.    Antionette Fairy, RN,BSN,CCM [facility] Care Management Care Management Community Coordinator Direct Phone: [phone] Toll Free: [phone] Fax: [phone]

## 2022-07-07 ENCOUNTER — Ambulatory Visit: Payer: Medicare Other | Attending: Cardiology

## 2022-07-07 ENCOUNTER — Telehealth: Payer: Self-pay

## 2022-07-07 DIAGNOSIS — Z952 Presence of prosthetic heart valve: Secondary | ICD-10-CM | POA: Diagnosis not present

## 2022-07-07 DIAGNOSIS — Z5181 Encounter for therapeutic drug level monitoring: Secondary | ICD-10-CM | POA: Insufficient documentation

## 2022-07-07 LAB — POCT INR: INR: 3.1 — AB (ref 2.0–3.0)

## 2022-07-07 MED FILL — Electrolyte-R (PH 7.4) Solution: INTRAVENOUS | Qty: 6000 | Status: AC

## 2022-07-07 MED FILL — Sodium Chloride IV Soln 0.9%: INTRAVENOUS | Qty: 2000 | Status: AC

## 2022-07-07 MED FILL — Mannitol IV Soln 20%: INTRAVENOUS | Qty: 500 | Status: AC

## 2022-07-07 MED FILL — Albumin, Human Inj 5%: INTRAVENOUS | Qty: 250 | Status: AC

## 2022-07-07 MED FILL — Calcium Chloride Inj 10%: INTRAVENOUS | Qty: 10 | Status: AC

## 2022-07-07 MED FILL — Heparin Sodium (Porcine) Inj 1000 Unit/ML: INTRAMUSCULAR | Qty: 10 | Status: AC

## 2022-07-07 MED FILL — Sodium Bicarbonate IV Soln 8.4%: INTRAVENOUS | Qty: 50 | Status: AC

## 2022-07-07 NOTE — Transitions of Care (Post Inpatient/ED Visit) (Signed)
   07/07/2022  Name: Ian Gross MRN: [id number] DOB: 16-May-1975  Today's TOC FU Call Status: Today's TOC FU Call Status:: Successful TOC FU Call Competed TOC FU Call Complete Date: 07/07/22  Transition Care Management Follow-up Telephone Call Date of Discharge: 07/05/22 Discharge Facility: [facility]) Type of Discharge: Inpatient Admission Primary Inpatient Discharge Diagnosis:: "NSTEMI s/p CABGx2" How have you been since you were released from the hospital?: Better (Pt states he is gettting better-still has some SOB/fatigue with exertion but states 'its getting better." Pain is controlled with current regimen-taking pain med about 4x/day, appetite improving,LBM yesterday) Any questions or concerns?: Yes Patient Questions/Concerns:: patient wanting to know if he can suntan while taking blood thinner Patient Questions/Concerns Addressed: Other: (reviewed with pt instrutions/guidelines with Warfarin and exposure to sunlight-he voiced understanding)  Items Reviewed: Did you receive and understand the discharge instructions provided?: Yes Medications obtained and verified?: Yes (Medications Reviewed) Any new allergies since your discharge?: No Dietary orders reviewed?: Yes Type of Diet Ordered:: low salt/heart healthy/carb modified Do you have support at home?: Yes People in Home: spouse Name of Support/Comfort Primary Source: wife  Home Care and Equipment/Supplies: Were Home Health Services Ordered?: Yes Name of Home Health Agency:: [facility] Has Agency set up a time to come to your home?: Yes First Home Health Visit Date: 07/06/22 Any new equipment or medical supplies ordered?: No  Functional Questionnaire: Do you need assistance with bathing/showering or dressing?: No Do you need assistance with meal preparation?: No Do you need assistance with eating?: No Do you have difficulty maintaining continence: No Do you need assistance with getting out of bed/getting out of a  chair/moving?: No Do you have difficulty managing or taking your medications?: No  Follow up appointments reviewed: PCP Follow-up appointment confirmed?: Yes Date of PCP follow-up appointment?: 07/13/22 Follow-up Provider: [facility] Follow-up appointment confirmed?: Yes Date of Specialist follow-up appointment?: 07/09/22 Follow-Up Specialty Provider:: Dr. Deliah Goody visit Do you need transportation to your follow-up appointment?: No Do you understand care options if your condition(s) worsen?: Yes-patient verbalized understanding  SDOH Interventions Today    Flowsheet Row Most Recent Value  SDOH Interventions   Food Insecurity Interventions Intervention Not Indicated  Transportation Interventions Intervention Not Indicated      TOC Interventions Today    Flowsheet Row Most Recent Value  TOC Interventions   TOC Interventions Discussed/Reviewed TOC Interventions Discussed, S/S of infection, Post op wound/incision care, Arranged PCP follow up within 7 days/Care Guide scheduled      Interventions Today    Flowsheet Row Most Recent Value  General Interventions   General Interventions Discussed/Reviewed General Interventions Discussed, Doctor Visits  Doctor Visits Discussed/Reviewed PCP, Doctor Visits Discussed, Specialist  PCP/Specialist Visits Compliance with follow-up visit  Education Interventions   Education Provided Provided Education  Provided Verbal Education On Nutrition, When to see the doctor, Medication  Nutrition Interventions   Nutrition Discussed/Reviewed Adding fruits and vegetables, Decreasing salt, Nutrition Discussed  [reviewed with pt foods to avoid while on Coumadin]  Pharmacy Interventions   Pharmacy Dicussed/Reviewed Pharmacy Topics Discussed, Medications and their functions  Safety Interventions   Safety Discussed/Reviewed Safety Discussed       Alessandra Grout [facility] Health/[facility] Care Management Care  Management Community Coordinator Direct Phone: [phone] Toll Free: [phone] Fax: [phone]

## 2022-07-07 NOTE — Patient Instructions (Addendum)
Description   Skip today's dosage of Warfarin, then start taking 1/2 tablet (1.25mg ) daily.  Recheck on Monday in [place].  Call with new medications or bleeding problems Coumadin Clinic [phone] or [place] office [phone].        A full discussion of the nature of anticoagulants has been carried out.  A benefit risk analysis has been presented to the patient, so that they understand the justification for choosing anticoagulation at this time. The need for frequent and regular monitoring, precise dosage adjustment and compliance is stressed.  Side effects of potential bleeding are discussed.  The patient should avoid any OTC items containing aspirin or ibuprofen, and should avoid great swings in general diet.  Avoid alcohol consumption.  Call if any signs of abnormal bleeding.

## 2022-07-09 ENCOUNTER — Ambulatory Visit (INDEPENDENT_AMBULATORY_CARE_PROVIDER_SITE_OTHER): Payer: Self-pay | Admitting: Thoracic Surgery (Cardiothoracic Vascular Surgery)

## 2022-07-09 DIAGNOSIS — Z79891 Long term (current) use of opiate analgesic: Secondary | ICD-10-CM | POA: Diagnosis not present

## 2022-07-09 DIAGNOSIS — Z90410 Acquired total absence of pancreas: Secondary | ICD-10-CM | POA: Diagnosis not present

## 2022-07-09 DIAGNOSIS — G8929 Other chronic pain: Secondary | ICD-10-CM | POA: Diagnosis not present

## 2022-07-09 DIAGNOSIS — E119 Type 2 diabetes mellitus without complications: Secondary | ICD-10-CM | POA: Diagnosis not present

## 2022-07-09 DIAGNOSIS — I214 Non-ST elevation (NSTEMI) myocardial infarction: Secondary | ICD-10-CM | POA: Diagnosis not present

## 2022-07-09 DIAGNOSIS — Z48812 Encounter for surgical aftercare following surgery on the circulatory system: Secondary | ICD-10-CM | POA: Diagnosis not present

## 2022-07-09 DIAGNOSIS — Z951 Presence of aortocoronary bypass graft: Secondary | ICD-10-CM

## 2022-07-09 DIAGNOSIS — D62 Acute posthemorrhagic anemia: Secondary | ICD-10-CM | POA: Diagnosis not present

## 2022-07-09 DIAGNOSIS — Z952 Presence of prosthetic heart valve: Secondary | ICD-10-CM | POA: Diagnosis not present

## 2022-07-09 DIAGNOSIS — I2510 Atherosclerotic heart disease of native coronary artery without angina pectoris: Secondary | ICD-10-CM | POA: Diagnosis not present

## 2022-07-09 DIAGNOSIS — Z9081 Acquired absence of spleen: Secondary | ICD-10-CM | POA: Diagnosis not present

## 2022-07-09 NOTE — Progress Notes (Signed)
     [address]       [place] [address]             [phone]       Patient: Home Provider: Office Consent for Telemedicine visit obtained.  Today's visit was completed via a real-time telehealth (see specific modality noted below). The patient/authorized person provided oral consent at the time of the visit to engage in a telemedicine encounter with the present provider at [facility]. The patient/authorized person was informed of the potential benefits, limitations, and risks of telemedicine. The patient/authorized person expressed understanding that the laws that protect confidentiality also apply to telemedicine. The patient/authorized person acknowledged understanding that telemedicine does not provide emergency services and that he or she would need to call 911 or proceed to the nearest hospital for help if such a need arose.   Total time spent in the clinical discussion 10 minutes.  Telehealth Modality: Phone visit (audio only)  I had a telephone visit with  [name] who is s/p AVR CABG.  Overall doing well.  Pain is minimal.  Ambulating well. Vitals have been stable.  Dyllan Schleeter will see [place] back in 1 month with a chest x-ray for cardiac rehab clearance.  Latarsha Zani Keane Scrape

## 2022-07-12 ENCOUNTER — Ambulatory Visit: Payer: Medicare Other | Attending: Internal Medicine | Admitting: *Deleted

## 2022-07-12 DIAGNOSIS — Z5181 Encounter for therapeutic drug level monitoring: Secondary | ICD-10-CM

## 2022-07-12 DIAGNOSIS — Z952 Presence of prosthetic heart valve: Secondary | ICD-10-CM | POA: Diagnosis not present

## 2022-07-12 LAB — POCT INR: INR: 2.2 (ref 2.0–3.0)

## 2022-07-12 NOTE — Patient Instructions (Signed)
Description   Continue taking warfarin 1/2 tablet (1.25mg ) daily. Recheck in 1 week.  Call with new medications or bleeding problems Coumadin Clinic [phone] or [place] office [phone].

## 2022-07-13 ENCOUNTER — Encounter: Payer: Self-pay | Admitting: Internal Medicine

## 2022-07-13 ENCOUNTER — Ambulatory Visit (INDEPENDENT_AMBULATORY_CARE_PROVIDER_SITE_OTHER): Payer: Medicare Other | Admitting: Internal Medicine

## 2022-07-13 ENCOUNTER — Telehealth: Payer: Self-pay

## 2022-07-13 VITALS — BP 102/60 | HR 103 | Temp 97.1°F | Wt 127.0 lb

## 2022-07-13 DIAGNOSIS — Z952 Presence of prosthetic heart valve: Secondary | ICD-10-CM

## 2022-07-13 DIAGNOSIS — I2510 Atherosclerotic heart disease of native coronary artery without angina pectoris: Secondary | ICD-10-CM | POA: Diagnosis not present

## 2022-07-13 DIAGNOSIS — Z9081 Acquired absence of spleen: Secondary | ICD-10-CM | POA: Diagnosis not present

## 2022-07-13 DIAGNOSIS — G8929 Other chronic pain: Secondary | ICD-10-CM | POA: Diagnosis not present

## 2022-07-13 DIAGNOSIS — Z79891 Long term (current) use of opiate analgesic: Secondary | ICD-10-CM | POA: Diagnosis not present

## 2022-07-13 DIAGNOSIS — I214 Non-ST elevation (NSTEMI) myocardial infarction: Secondary | ICD-10-CM | POA: Diagnosis not present

## 2022-07-13 DIAGNOSIS — I351 Nonrheumatic aortic (valve) insufficiency: Secondary | ICD-10-CM | POA: Diagnosis not present

## 2022-07-13 DIAGNOSIS — R636 Underweight: Secondary | ICD-10-CM

## 2022-07-13 DIAGNOSIS — D62 Acute posthemorrhagic anemia: Secondary | ICD-10-CM | POA: Diagnosis not present

## 2022-07-13 DIAGNOSIS — E119 Type 2 diabetes mellitus without complications: Secondary | ICD-10-CM | POA: Diagnosis not present

## 2022-07-13 DIAGNOSIS — Z48812 Encounter for surgical aftercare following surgery on the circulatory system: Secondary | ICD-10-CM | POA: Diagnosis not present

## 2022-07-13 DIAGNOSIS — Z90410 Acquired total absence of pancreas: Secondary | ICD-10-CM | POA: Diagnosis not present

## 2022-07-13 NOTE — Progress Notes (Signed)
Subjective:    Patient ID: Ian Gross, male    DOB: 06/08/1975, 47 y.o.   MRN: [id number]  HPI  Patient presents to clinic today for TCM hospital follow-up.  He presented to the ER 3/20 via EMS with complaint of chest pain.  He received aspirin and nitroglycerin and route.  ECG was concerning for STEMI.  He underwent cardiac cath by Dr. And that showed 90% stenosis of the LAD.  It was recommended that he have a CABG.  He was transferred to [facility] for further treatment.  Echocardiogram showed severe aortic insufficiency.  He subsequently underwent CABG x 2 and aortic valve replacement.  His postop course was complicated by anemia and he ended up having to be transfused on 2 separate occasions.  Prior to discharge, he was started on Coumadin.  He was discharged on 4/8 and advised to follow-up with his PCP, cardiology and Coumadin clinic.  Since that time, he reports he has been fatigued but this seems to be getting better.  His appetite is improving.  He has having some exertional shortness of breath and admits that he self discontinued cardiac rehab.  He has some incisional chest pain but is not having any other chest pains.  He is not having any difficulty urinating or problems with his bowels.  He is taking all of his medications as prescribed.  He is following up with cardiology, endocrinology and Coumadin clinic as scheduled.  Review of Systems   Past Medical History:  Diagnosis Date   Alcoholic pancreatitis    s/p pancreatectomy   Diabetes mellitus with complication    H/O splenectomy     Current Outpatient Medications  Medication Sig Dispense Refill   atorvastatin (LIPITOR) 80 MG tablet Take 1 tablet (80 mg total) by mouth daily. 30 tablet 1   Buprenorphine HCl-Naloxone HCl 8-2 MG FILM Place 1 Film under the tongue 2 (two) times daily.     gabapentin (NEURONTIN) 300 MG capsule Take 1 capsule (300 mg total) by mouth 3 (three) times daily. 90 capsule 0   HUMALOG KWIKPEN 100  UNIT/ML KwikPen Inject 4-9 Units into the skin 3 (three) times daily. Sliding scale (go up by 1 unit)     insulin glargine (LANTUS) 100 UNIT/ML injection Inject 10 Units into the skin at bedtime.     methocarbamol (ROBAXIN) 500 MG tablet Take 1 tablet (500 mg total) by mouth every 8 (eight) hours as needed for muscle spasms. 90 tablet 0   metoprolol tartrate (LOPRESSOR) 25 MG tablet Take 1 tablet (25 mg total) by mouth 2 (two) times daily. 60 tablet 1   nicotine (NICODERM CQ - DOSED IN MG/24 HOURS) 14 mg/24hr patch Place 1 patch (14 mg total) onto the skin daily. 28 patch 0   oxyCODONE (OXY IR/ROXICODONE) 5 MG immediate release tablet Take 1 tablet (5 mg total) by mouth every 6 (six) hours as needed for severe pain. 30 tablet 0   Pancrelipase, Lip-Prot-Amyl, (CREON) 24000-76000 units CPEP Take 3 capsules (72,000 Units total) by mouth 3 (three) times daily. 200 capsule 1   warfarin (COUMADIN) 2.5 MG tablet Take 1 tablet (2.5 mg total) by mouth daily at 4 PM. Or as directed 30 tablet 1   No current facility-administered medications for this visit.    No Known Allergies  Family History  Problem Relation Age of Onset   Lung cancer Father    Healthy Sister    Healthy Brother    CVA Maternal Grandmother  Liver cancer Maternal Grandfather    Alcohol abuse Maternal Grandfather     Social History   Socioeconomic History   Marital status: Significant Other    Spouse name: Not on file   Number of children: 2   Years of education: Not on file   Highest education level: Not on file  Occupational History   Occupation: Disable  Tobacco Use   Smoking status: Every Day    Packs/day: .75    Types: Cigarettes   Smokeless tobacco: Never  Vaping Use   Vaping Use: Never used  Substance and Sexual Activity   Alcohol use: Not Currently   Drug use: Not Currently   Sexual activity: Not on file  Other Topics Concern   Not on file  Social History Narrative   Not on file   Social Determinants  of Health   Financial Resource Strain: Low Risk  (09/03/2021)   Overall Financial Resource Strain (CARDIA)    Difficulty of Paying Living Expenses: Not hard at all  Food Insecurity: No Food Insecurity (07/07/2022)   Hunger Vital Sign    Worried About Running Out of Food in the Last Year: Never true    Ran Out of Food in the Last Year: Never true  Transportation Needs: No Transportation Needs (07/07/2022)   PRAPARE - Administrator, Civil Service (Medical): No    Lack of Transportation (Non-Medical): No  Physical Activity: Not on file  Stress: No Stress Concern Present (09/03/2021)   [employer] of Occupational Health - Occupational Stress Questionnaire    Feeling of Stress : Only a little  Social Connections: Moderately Isolated (09/03/2021)   Social Connection and Isolation Panel [NHANES]    Frequency of Communication with Friends and Family: Twice a week    Frequency of Social Gatherings with Friends and Family: More than three times a week    Attends Religious Services: Never    Database administrator or Organizations: No    Attends Engineer, structural: Not on file    Marital Status: Living with partner  Intimate Partner Violence: Not At Risk (09/03/2021)   Humiliation, Afraid, Rape, and Kick questionnaire    Fear of Current or Ex-Partner: No    Emotionally Abused: No    Physically Abused: No    Sexually Abused: No     Constitutional: Pt reports fatigue. Denies fever, malaise, headache or abrupt weight changes.  HEENT: Denies eye pain, eye redness, ear pain, ringing in the ears, wax buildup, runny nose, nasal congestion, bloody nose, or sore throat. Respiratory: Pt reports intermittent shortness of breath. Denies difficulty breathing, cough or sputum production.   Cardiovascular: Pt reports incisional chest pain. Denies chest pain, chest tightness, palpitations or swelling in the hands or feet.  Gastrointestinal: Denies abdominal pain, bloating, constipation,  diarrhea or blood in the stool.  GU: Denies urgency, frequency, pain with urination, burning sensation, blood in urine, odor or discharge. Musculoskeletal: Denies decrease in range of motion, difficulty with gait, muscle pain or joint pain and swelling.  Skin: Denies redness, rashes, lesions or ulcercations.  Neurological: Pt reports intermittent lightheadedness. Denies dizziness, difficulty with memory, difficulty with speech or problems with balance and coordination.  Psych: Denies anxiety, depression, SI/HI.  No other specific complaints in a complete review of systems (except as listed in HPI above).   Objective:   Physical Exam  BP 102/60 (BP Location: Right Arm, Patient Position: Sitting, Cuff Size: Normal)   Pulse (!) 103   Temp (!)  97.1 F (36.2 C) (Temporal)   Wt 127 lb (57.6 kg)   SpO2 95%   BMI 17.22 kg/m   Wt Readings from Last 3 Encounters:  07/05/22 121 lb 4.8 oz (55 kg)  06/15/22 135 lb 2.3 oz (61.3 kg)  12/09/21 129 lb (58.5 kg)    General: Appears his stated age, underweight in NAD. Skin: Warm, dry and intact.  He is pale.  Sternal incision healing appropriately. HEENT: Head: normal shape and size; Eyes: sclera white, no icterus, conjunctiva pink, PERRLA and EOMs intact;  Cardiovascular: Tachycardic with normal rhythm. S1,S2 noted.  No murmur, rubs or gallops noted. No JVD or BLE edema. No carotid bruits noted. Pulmonary/Chest: Normal effort and positive vesicular breath sounds. No respiratory distress. No wheezes, rales or ronchi noted.  Musculoskeletal: No difficulty with gait.  Neurological: Alert and oriented. Coordination normal.     BMET    Component Value Date/Time   NA 134 (L) 07/01/2022 0045   K 3.8 07/01/2022 0045   CL 99 07/01/2022 0045   CO2 30 07/01/2022 0045   GLUCOSE 177 (H) 07/01/2022 0045   BUN 13 07/01/2022 0045   CREATININE 1.13 07/01/2022 0045   CREATININE 0.96 12/09/2021 1055   CALCIUM 8.3 (L) 07/01/2022 0045   GFRNONAA >60  07/01/2022 0045   GFRAA >60 08/01/2019 0509    Lipid Panel     Component Value Date/Time   CHOL 98 06/15/2022 1553   TRIG 58 06/15/2022 1553   HDL 48 06/15/2022 1553   CHOLHDL 2.0 06/15/2022 1553   VLDL 12 06/15/2022 1553   LDLCALC 38 06/15/2022 1553   LDLCALC 30 12/09/2021 1055    CBC    Component Value Date/Time   WBC 16.1 (H) 07/03/2022 0101   RBC 1.87 (L) 07/03/2022 0101   HGB 7.1 (L) 07/03/2022 1145   HCT 21.3 (L) 07/03/2022 1145   PLT 325 07/03/2022 0101   MCV 96.3 07/03/2022 0101   MCH 33.2 07/03/2022 0101   MCHC 34.4 07/03/2022 0101   RDW 13.6 07/03/2022 0101   LYMPHSABS 3.4 06/15/2022 1553   MONOABS 1.3 (H) 06/15/2022 1553   EOSABS 0.3 06/15/2022 1553   BASOSABS 0.1 06/15/2022 1553    Hgb A1C Lab Results  Component Value Date   HGBA1C 7.9 (H) 06/27/2022           Assessment & Plan:   [facility] Follow Up for NSTEMI, Aortic Valve Insufficiency s/p replacement, Hyperlipidemia Associated with DM 2:  Hospital notes, labs and imaging reviewed He will continue current medications at this time CBC and c-Met today He will follow up with endocrinology, cardiology and coumadin clinic as previously scheduled  RTC in 3 months for follow-up of chronic conditions Nicki Reaper, NP

## 2022-07-13 NOTE — Patient Instructions (Signed)
Heart Attack A heart attack occurs when blood and oxygen supply to the heart is cut off. A heart attack can cause damage to the heart that cannot be fixed. A heart attack is also called a myocardial infarction, or MI. If you think you are having a heart attack, do not wait to see if the symptoms will go away. Get medical help right away. What are the causes? This condition may be caused by: A fatty substance (plaque) in the blood vessels (arteries). This can block the flow of blood to the heart. A blood clot in the blood vessels that go to the heart. The blood clot blocks blood flow. An abnormal heartbeat. Some diseases, such as problems in red blood cells (anemia)orproblems in breathing (respiratory failure). Tightening (spasm) of a blood vessel that cuts off blood to the heart. A tear in a blood vessel of the heart. Other causes may include: Using drugs such as cocaine or methamphetamine. Low blood pressure. What increases the risk? Aging. The risk gets higher as you get older. Having a personal or family history of chest pain, heart attack, stroke, or narrowing of the arteries in the legs, arms, head, or stomach (peripheral vascular disease). Having taken chemotherapy or immune-suppressing medicines. Being male. Being overweight or obese. Having any of these conditions: High blood pressure. High cholesterol. Diabetes. Making lifestyle choices such as: Drinking too much alcohol. Not getting regular exercise. Smoking. What are the signs or symptoms? Chest pain. It may feel like: Crushing or squeezing. Tightness, pressure, fullness, or heaviness. Pain in the arm, neck, jaw, back, or upper body. Heartburn. Upset stomach (indigestion). Shortness of breath. Feeling like you may vomit (nauseous). Cold sweats. Sudden light-headedness, dizziness, or passing out. Feeling tired. How is this treated? A heart attack must be treated as soon as possible. Treatment may  include: Medicines to: Break up or dissolve blood clots. Thin your blood and help prevent blood clots. Treat blood pressure. Improve blood flow to the heart. Reduce pain. Reduce cholesterol. Procedures to widen a blocked artery and keep it open. Open heart surgery. Making your heart strong again (cardiac rehabilitation) through exercise, education, and counseling. Follow these instructions at home: Medicines Take over-the-counter and prescription medicines only as told by your doctor. Do not take these medicines unless your doctor says it is okay: NSAIDs, such as ibuprofen, naproxen, or celecoxib. Any vitamins or supplements. Hormone replacement therapy that has estrogen with or without progestin. If you are taking blood thinners: Talk with your doctor before taking any medicines that have aspirin or NSAIDs, such as ibuprofen. Take medicines exactly as told. Take them at the same time each day. Avoid doing things that could hurt or bruise you. Take action to prevent falls. Wear an alert bracelet or carry a card that shows you are taking blood thinners. Lifestyle  Do not smoke or use any products that contain nicotine or tobacco. If you need help quitting, ask your doctor. Avoid secondhand smoke. Exercise regularly. Ask your doctor about a cardiac rehab program. Eat heart-healthy foods. Your doctor will tell you what foods to eat. Stay at a healthy weight. Learn ways to lower your stress level. Do not use illegal drugs. Alcohol use Do not drink alcohol if: Your doctor tells you not to drink. You are pregnant, may be pregnant, or are planning to become pregnant. If you drink alcohol: Limit how much you have to: 0-1 drink a day for women. 0-2 drinks a day for men. Know how much alcohol is in   your drink. In the [place] one drink equals one 12 oz bottle of beer (355 mL), one 5 oz glass of wine (148 mL), or one 1 oz glass of hard liquor (44 mL). General instructions Work with your  doctor to treat other problems you may have, such as diabetes or high blood pressure. Get screened for depression. Get treatment if needed. Keep your vaccines up to date. Get the flu shot (influenza vaccine) every year. Keep all follow-up visits. Contact a doctor if: You feel very sad. You have trouble doing your daily activities. You get light-headed or dizzy. Get help right away if: You have sudden, unexplained discomfort in your chest, arms, back, neck, jaw, or upper body. You have shortness of breath. You have sudden sweating or clammy skin. You feel like you may vomit or you vomit. You feel tired or weak. You feel your heart beating fast. You feel your heart skipping beats. You have blood pressure that is higher than 180/120. These symptoms may be an emergency. Get help right away. Call your local emergency services (911 in the [place] Do not wait to see if the symptoms will go away. Do not drive yourself to the hospital. Summary A heart attack occurs when blood and oxygen supply to the heart is cut off. Do not take NSAIDs unless your doctor says it is okay. Do not smoke. Avoid secondhand smoke. Exercise regularly. Ask your doctor about a cardiac rehab program. This information is not intended to replace advice given to you by your health care provider. Make sure you discuss any questions you have with your health care provider. Document Revised: 09/04/2020 Document Reviewed: 09/04/2020 Elsevier Patient Education  2023 Elsevier Inc.  

## 2022-07-13 NOTE — Telephone Encounter (Signed)
Verlon Au, RN with [facility] contacted the office stating patient has declined further home health services, declining all. She states that patient says he has "too many appointment", he is "getting around the house well", and is "going to the mall". All further [facility] services have be cancelled.

## 2022-07-13 NOTE — Assessment & Plan Note (Signed)
Encouraged Boost protein shakes

## 2022-07-21 ENCOUNTER — Ambulatory Visit: Payer: Medicare Other | Attending: Cardiology | Admitting: Cardiology

## 2022-07-21 ENCOUNTER — Encounter: Payer: Self-pay | Admitting: Cardiology

## 2022-07-21 ENCOUNTER — Ambulatory Visit (INDEPENDENT_AMBULATORY_CARE_PROVIDER_SITE_OTHER): Payer: Medicare Other

## 2022-07-21 VITALS — BP 84/53 | HR 81 | Ht 72.0 in | Wt 132.0 lb

## 2022-07-21 DIAGNOSIS — Z952 Presence of prosthetic heart valve: Secondary | ICD-10-CM

## 2022-07-21 DIAGNOSIS — Z951 Presence of aortocoronary bypass graft: Secondary | ICD-10-CM | POA: Diagnosis not present

## 2022-07-21 DIAGNOSIS — I2510 Atherosclerotic heart disease of native coronary artery without angina pectoris: Secondary | ICD-10-CM | POA: Diagnosis not present

## 2022-07-21 DIAGNOSIS — I214 Non-ST elevation (NSTEMI) myocardial infarction: Secondary | ICD-10-CM

## 2022-07-21 DIAGNOSIS — G894 Chronic pain syndrome: Secondary | ICD-10-CM

## 2022-07-21 DIAGNOSIS — Z7901 Long term (current) use of anticoagulants: Secondary | ICD-10-CM

## 2022-07-21 DIAGNOSIS — D649 Anemia, unspecified: Secondary | ICD-10-CM

## 2022-07-21 DIAGNOSIS — I9589 Other hypotension: Secondary | ICD-10-CM

## 2022-07-21 DIAGNOSIS — Z5181 Encounter for therapeutic drug level monitoring: Secondary | ICD-10-CM | POA: Diagnosis not present

## 2022-07-21 DIAGNOSIS — E1165 Type 2 diabetes mellitus with hyperglycemia: Secondary | ICD-10-CM | POA: Diagnosis not present

## 2022-07-21 DIAGNOSIS — Z794 Long term (current) use of insulin: Secondary | ICD-10-CM

## 2022-07-21 DIAGNOSIS — Z720 Tobacco use: Secondary | ICD-10-CM | POA: Diagnosis not present

## 2022-07-21 DIAGNOSIS — I255 Ischemic cardiomyopathy: Secondary | ICD-10-CM

## 2022-07-21 LAB — POCT INR: INR: 1.3 — AB (ref 2.0–3.0)

## 2022-07-21 MED ORDER — METOPROLOL TARTRATE 25 MG PO TABS: 12.5000 mg | ORAL_TABLET | Freq: Two times a day (BID) | ORAL | 1 refills | Status: DC

## 2022-07-21 NOTE — Patient Instructions (Signed)
Description   Take 1 tablet (2.5mg ) today and tomorrow, then start taking warfarin 1/2 tablet (1.25mg ) daily except 1 tablet (2.5mg ) on Sundays and Thursdays.  Recheck in 1 week.  Call with new medications or bleeding problems [facility] [phone] or [place] office [phone].

## 2022-07-21 NOTE — Progress Notes (Signed)
Cardiology Office Note:   Date:  07/21/2022  ID:  Ian Gross, DOB 02-Jul-1975, MRN [id number]  History of Present Illness:   Ian Gross is a 47 y.o. 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  Patient presented to [facility] on 06/15/2022 where he was having centralized chest pain that was described as a pressure/squeezing that radiated into his lateral arms with associated paresthesias around 10 AM associated nausea.  He has not had to take a bath and noted improvement in pain shortly thereafter.  However around 3 PM the pain returned and was worse prompting her to contact EMS.  Initial EKG in the field showed sinus rhythm with a 70 bpm, ST elevation in anterior leads.  Repeat EKG in the field again showed sinus rhythm rate of 69 bpm with anterior ST elevation with reciprocal lateral changes concerning for MI.  He received 325 mg of aspirin and 2 sprays of sublingual nitro on arrival to [facility] with repeat EKG shows sinus rhythm with improvement without complete resolution of the anterior lead ST elevations. Given concern for ST segment elevation myocardial infarction on EMS EKG with concern for possible unstable plaque in the LAD leading to his intermittent angina and dynamic EKG changes the patient was taken emergently to the cardiac Cath Lab.  Catheterization revealed severe proximal LAD disease along with moderate First diagonal branch and extending back into the ostium.  2).  Ramus intermedius and left circumflex.   PCI was to have risk and the patient will be better served by CABG. after his catheterization he was currently pain-free and hemodynamically stable.  IV heparin restarted 2 hours after TR band evaluation and he was transferred to the ICU at [facility].  He underwent echocardiogram which revealed moderate to severe AR.  The patient had poor dentition which required dental consultation with multiple teeth were extracted on 06/23/2022.  He underwent CABG x 2 (LIMA->LAD, RSVG-> Diagonal) with mechanical AVR.  Postop blood loss anemia was transfused.  He also had thrombocytopenia platelets been as low as 82,000.  He was started on Coumadin on 4/2 PT/INR were monitored daily.  He was transitioned off of insulin drip.Hemoglobin A1c was 7.9.  He was volume overloaded and diuresed accordingly.  He was felt surgically stable for transfer Abelina Bachelor to the floor on 4/3.  He developed drop in his hemoglobin to 11-6.2.  Repeat H&H about hemoglobin was 7.1.  He did not require transfusion.  He was resumed on Coumadin/5/24.  His INR was 2.1 with a goal 2.5-3.5.  He refused to remain for Coumadin clinic and cardiac rehab.  He was able to ambulate in the room without difficulty and there were no further issues with anemia noted.  He was subsequently considered stable for discharge and was discharged on/8/24.  He returns to clinic today stating that overall he has been doing fairly well.  He continues to some discomfort and pain right along his incision but it is improving daily.  He still continues to have fatigue but is also increasing the amount of activity that he is doing each day and has required less episodes  where he has to stop and rest before continuing on.  He states that he has been walking with his wife as much as possible.  It has become easier to sleep in the bed.  His appetite is improving.  He has noted occasional swelling to his right lower extremity but only mild and goes away by the next morning.  He denies any  chest pain or shortness of breath.  He does have some concerns over his blood pressure being on the lower today than what it has been previously.  He has been compliant with his current medications.  He also states that he has an appointment coming up for his chest x-ray post-op.   ROS: 10 point review of systems has been reviewed is considered negative with exception of what is listed in the HPI  Studies Reviewed:    EKG: Normal sinus rhythm with a rate of 81 with a rightward axis   TTE 06/16/22 1. Left ventricular ejection fraction, by estimation, is 40%. The left  ventricle has mild to moderately decreased function. The left ventricle  demonstrates regional wall motion abnormalities with mid to apical  anteroseptal and inferoseptal akinesis and  anterior hypokinesis. The left ventricular internal cavity size was mildly  dilated. Left ventricular diastolic parameters were normal.   2. Right ventricular systolic function is normal. The right ventricular  size is normal. Tricuspid regurgitation signal is inadequate for assessing  PA pressure.   3. The mitral valve is normal in structure. No evidence of mitral valve  regurgitation. No evidence of mitral stenosis.   4. The aortic valve is probaby tricuspid though not visualized ideally.  There is mild calcification of the aortic valve. Aortic valve  regurgitation is moderate to severe. There was no holodiastolic flow  reversal noted in the descending thoracic aorta.  No aortic stenosis is present.   5. The inferior vena cava is normal in size with <50% respiratory  variability, suggesting right atrial pressure of 8 mmHg.   LHC 06/15/22 Conclusions: Severe complex ostial/proximal LAD involving moderate-caliber D1 that is difficult to visualize due to vessel overlap.  Lesion appears to be an acute plaque rupture with up to 90% stenosis involving the LAD and 75% stenosis involving D1 leading to the patient's NSTEMI.  LMCA, ramus intermedius,  LCx, and RCA are without significant disease. Mildly reduced left ventricular systolic function with mild mid and apical anterior and apical inferior hypokinesis (LVEF 45-50%). Normal left ventricular filling pressure (LVEDP 6 mmHg).   Recommendations: Case reviewed with Dr. Herbie Baltimore (interventional cardiology) and Dr. Cliffton Asters (cardiothoracic surgery).  Given proximal LAD disease involving sizeable D1 and adjacent ramus intermedius branch, PCI would be high risk.  In the setting of his DM and LAD/D1 disease, we have agreed that CABG may be the best revascularization option.  Given that Mr. Fosco is currently chest pain free and hemodynamically stable, we will transfer him to [facility] for urgent evaluation and possible CABG as soon as tomorrow. Start IV heparin 2 hours after TR band deflation. High-intensity statin therapy. Aspirin 81 mg daily; defer P2Y12 inhibitor pending cardiac surgery evaluation. Start metoprolol tartrate 12.5 mg twice daily.  Risk Assessment/Calculations:              Physical Exam:   VS:  BP (!) 84/53   Pulse 81   Ht 6' (1.829 m)   Wt 132 lb (59.9 kg)   SpO2 100%   BMI 17.90 kg/m    Wt Readings from Last 3  Encounters:  07/21/22 132 lb (59.9 kg)  07/13/22 127 lb (57.6 kg)  07/05/22 121 lb 4.8 oz (55 kg)     GEN: Well nourished, well developed in no acute distress NECK: No JVD; No carotid bruits CARDIAC: RRR, no murmurs, rubs, gallops RESPIRATORY:  Clear to auscultation without rales, wheezing or rhonchi  ABDOMEN: Soft, non-tender, non-distended SKIN: Midsternal incision well-healed with scabs no open areas noted, 3 incisions to the top of the abdomen from chest tube removal are scabbed and healing well EXTREMITIES:  No edema; No deformity   ASSESSMENT AND PLAN:   Coronary artery disease status post CABG x 2 and AVR.  Follows up today after recent discharge from [facility] on 07/05/2022.  Patient presented to [facility] with an  NSTEMI and was taken for emergent  cath and was found to need emergent CABG and was transferred to [facility].  Today he states that he has chest pain-free but still has incisional discomfort.  He is continued on atorvastatin 80 mg daily and warfarin and lieu of aspirin for his aortic valve replacement.  He also has Coumadin clinic today to reassess his INR with a goal of 2.5-3.5.  All of his wounds are healing well.  Clean, dry, intact, without any obvious signs of infection.  He is awaiting his follow-up with cardiovascular and thoracic surgery with a postoperative chest x-ray ordered prior to being cleared for cardiac rehab.  That appointment is scheduled.  Hypotension noted today with a blood pressure of 84/53.  He also had hypotension during the hospital and his metoprolol had to be decreased to 25 mg twice daily.  Today with his decreased pressures metoprolol is being decreased to 12.5 mg twice daily.  He did ask if he can take 1 tablet daily and he was advised that the tartrate is a short acting medication and he would need to take it twice daily if he stayed on this dose and we can change him to long-acting later on.  Ischemic cardiomyopathy with last EF of 40%.  He is continued on metoprolol tartrate with dose reduction today due to hypotension.  Escalation of medication is limited due to blood pressure.  Anemia during hospitalization which required transfusion.  Last hemoglobin was recorded at 7.1.  With low blood pressures today as well he has been sent for follow-up CBC.  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  Chronic pain syndrome where he is continued on Robaxin for muscle spasms, Neurontin 300 mg 3 times daily, and buprenorphine-naloxone twice daily. Previously had been followed by Suboxone clinic.  Previous history of smoking and marijuana use.  Total cessation is recommended.  Disposition patient return to clinic to see MD/APP in 4 weeks or sooner if  needed   Cardiac Rehabilitation Eligibility Assessment  The patient is NOT ready to start cardiac rehabilitation due to: Other (awaiting chest x-ray and follow-up from [facility])        Signed, Corena Tilson, NP

## 2022-07-21 NOTE — Patient Instructions (Signed)
Medication Instructions:  Your physician has recommended you make the following change in your medication:   START - metoprolol tartrate (LOPRESSOR) 25 MG tablet - Take 0.5 tablets (12.5 mg total) by mouth 2 (two) times daily  *If you need a refill on your cardiac medications before your next appointment, please call your pharmacy*  Lab Work: Your physician recommends that you get lab work today: CBC & Engineer, civil (consulting) at [facility] 1st desk on the right to check in (REGISTRATION)  Lab hours: Monday- Friday (7:30 am- 5:30 pm)  If you have labs (blood work) drawn today and your tests are completely normal, you will receive your results only by: MyChart Message (if you have MyChart) OR A paper copy in the mail If you have any lab test that is abnormal or we need to change your treatment, we will call you to review the results.  Testing/Procedures: -None ordered  Follow-Up: At [facility], you and your health needs are our priority.  As part of our continuing mission to provide you with exceptional heart care, we have created designated Provider Care Teams.  These Care Teams include your primary Cardiologist (physician) and Advanced Practice Providers (APPs -  Physician Assistants and Nurse Practitioners) who all work together to provide you with the care you need, when you need it.  We recommend signing up for the patient portal called "MyChart".  Sign up information is provided on this After Visit Summary.  MyChart is used to connect with patients for Virtual Visits (Telemedicine).  Patients are able to view lab/test results, encounter notes, upcoming appointments, etc.  Non-urgent messages can be sent to your provider as well.   To learn more about what you can do with MyChart, go to [email or web address].    Your next appointment:   4 week(s)  Provider:   You may see Yvonne Kendall, MD or one of the following Advanced Practice Providers on your designated Care Team:    Nicolasa Ducking, NP Eula Listen, PA-C Cadence Fransico Michael, PA-C Charlsie Quest, NP    Other Instructions -None

## 2022-07-27 ENCOUNTER — Other Ambulatory Visit: Payer: Self-pay | Admitting: Physician Assistant

## 2022-07-28 ENCOUNTER — Ambulatory Visit: Payer: Medicare Other | Attending: Internal Medicine | Admitting: *Deleted

## 2022-07-28 DIAGNOSIS — Z5181 Encounter for therapeutic drug level monitoring: Secondary | ICD-10-CM | POA: Diagnosis not present

## 2022-07-28 DIAGNOSIS — Z952 Presence of prosthetic heart valve: Secondary | ICD-10-CM | POA: Diagnosis not present

## 2022-07-28 LAB — POCT INR: INR: 1.4 — AB (ref 2.0–3.0)

## 2022-07-28 NOTE — Patient Instructions (Signed)
Take 1 tablet (2.5mg ) tonight then increase dose to 1 tablet daily except 1/2 tablet on Mondays , Wednesdays and Fridays.  Recheck in 1 week.  Call with new medications or bleeding problems [facility] [phone] or [place] office [phone].

## 2022-08-04 ENCOUNTER — Other Ambulatory Visit: Payer: Self-pay | Admitting: Thoracic Surgery (Cardiothoracic Vascular Surgery)

## 2022-08-04 ENCOUNTER — Ambulatory Visit: Payer: Medicare Other | Attending: Internal Medicine | Admitting: *Deleted

## 2022-08-04 DIAGNOSIS — Z952 Presence of prosthetic heart valve: Secondary | ICD-10-CM

## 2022-08-04 DIAGNOSIS — Z951 Presence of aortocoronary bypass graft: Secondary | ICD-10-CM

## 2022-08-04 DIAGNOSIS — Z5181 Encounter for therapeutic drug level monitoring: Secondary | ICD-10-CM | POA: Diagnosis not present

## 2022-08-04 LAB — POCT INR: INR: 1.4 — AB (ref 2.0–3.0)

## 2022-08-04 NOTE — Patient Instructions (Signed)
Increase warfarin to 1 tablet daily Recheck in 1 week.  Call with new medications or bleeding problems [facility] Clinic [phone] or [place] office [phone].

## 2022-08-06 DIAGNOSIS — R52 Pain, unspecified: Secondary | ICD-10-CM | POA: Diagnosis not present

## 2022-08-07 DIAGNOSIS — R52 Pain, unspecified: Secondary | ICD-10-CM | POA: Diagnosis not present

## 2022-08-07 DIAGNOSIS — K861 Other chronic pancreatitis: Secondary | ICD-10-CM | POA: Diagnosis not present

## 2022-08-07 DIAGNOSIS — Z720 Tobacco use: Secondary | ICD-10-CM | POA: Diagnosis not present

## 2022-08-07 DIAGNOSIS — Z79899 Other long term (current) drug therapy: Secondary | ICD-10-CM | POA: Diagnosis not present

## 2022-08-09 ENCOUNTER — Ambulatory Visit
Admit: 2022-08-09 | Discharge: 2022-08-09 | Disposition: A | Discharge status: Home / Self Care | Payer: Medicare Other | Attending: Thoracic Surgery (Cardiothoracic Vascular Surgery) | Admitting: Thoracic Surgery (Cardiothoracic Vascular Surgery)

## 2022-08-09 DIAGNOSIS — I350 Nonrheumatic aortic (valve) stenosis: Secondary | ICD-10-CM | POA: Diagnosis not present

## 2022-08-09 DIAGNOSIS — Z952 Presence of prosthetic heart valve: Secondary | ICD-10-CM | POA: Diagnosis not present

## 2022-08-09 DIAGNOSIS — I517 Cardiomegaly: Secondary | ICD-10-CM | POA: Diagnosis not present

## 2022-08-09 LAB — ECHOCARDIOGRAM COMPLETE
AR max vel: 2.62 cm2
AV Area VTI: 2.62 cm2
AV Area mean vel: 2.34 cm2
AV Mean grad: 6 mmHg
AV Peak grad: 12.1 mmHg
Ao pk vel: 1.74 m/s
Area-P 1/2: 2.95 cm2
Calc EF: 50.9 %
MV VTI: 2.95 cm2
S' Lateral: 3.5 cm
Single Plane A2C EF: 52.6 %
Single Plane A4C EF: 53 %

## 2022-08-09 NOTE — Progress Notes (Signed)
*  PRELIMINARY RESULTS* Echocardiogram 2D Echocardiogram has been performed.  Ian Gross 08/09/2022, 10:20 AM

## 2022-08-10 NOTE — Progress Notes (Deleted)
      [address]       Ian Gross [address]             [phone]    HPI: Patient returns for routine postoperative follow-up having undergone CABG x 2 and mechanical AVR on 06/28/22. The patient's early postoperative recovery while in the hospital was notable for some expected acute blood loss anemia as well as thrombocytopenia that improved with a blood transfusion and time otherwise he had a relatively routine postoperative recovery.  He had a virtual appointment on 04/12 with Dr. Cliffton Asters and reported he was progressing well. He also saw the cardiologist on 04/24 where he was noted to be recovering well but was hypotensive in the office, Lopressor was decreased to 12.5mg  BID. He was also sent for a CBC which has not resulted. He remains on Coumadin for his mechanical valve with a goal of 2.5-3.5, his most recent INR was 1.4.  Today he reports ***   Current Outpatient Medications  Medication Sig Dispense Refill   atorvastatin (LIPITOR) 80 MG tablet Take 1 tablet (80 mg total) by mouth daily. 30 tablet 1   Buprenorphine HCl-Naloxone HCl 8-2 MG FILM Place 1 Film under the tongue 2 (two) times daily.     gabapentin (NEURONTIN) 300 MG capsule Take 1 capsule (300 mg total) by mouth 3 (three) times daily. 90 capsule 0   HUMALOG KWIKPEN 100 UNIT/ML KwikPen Inject 4-9 Units into the skin 3 (three) times daily. Sliding scale (go up by 1 unit)     insulin glargine (LANTUS) 100 UNIT/ML injection Inject 10 Units into the skin at bedtime.     methocarbamol (ROBAXIN) 500 MG tablet Take 1 tablet (500 mg total) by mouth every 8 (eight) hours as needed for muscle spasms. 90 tablet 0   metoprolol tartrate (LOPRESSOR) 25 MG tablet Take 0.5 tablets (12.5 mg total) by mouth 2 (two) times daily. 60 tablet 1   Pancrelipase, Lip-Prot-Amyl, (CREON) 24000-76000 units CPEP Take 3 capsules (72,000 Units total) by mouth 3 (three) times daily. 200 capsule 1   warfarin (COUMADIN) 2.5 MG tablet Take  1 tablet (2.5 mg total) by mouth daily at 4 PM. Or as directed 30 tablet 1   No current facility-administered medications for this visit.   Vitals:  Physical Exam: *** General: Neuro: CV: Pulm: GI: Extremities: Wounds:  Diagnostic Tests: ***  Impression/Plan: ***     Jenny Reichmann, PA-C [facility] [phone]

## 2022-08-11 ENCOUNTER — Ambulatory Visit: Payer: Medicare Other | Attending: Internal Medicine

## 2022-08-11 DIAGNOSIS — Z5181 Encounter for therapeutic drug level monitoring: Secondary | ICD-10-CM

## 2022-08-11 DIAGNOSIS — Z952 Presence of prosthetic heart valve: Secondary | ICD-10-CM | POA: Diagnosis not present

## 2022-08-11 LAB — POCT INR: INR: 1.7 — AB (ref 2.0–3.0)

## 2022-08-11 NOTE — Patient Instructions (Signed)
Increase warfarin to 1 tablet daily, except 2 tablets every Wednesday Recheck in 2 weeks.  Call with new medications or bleeding problems Coumadin Clinic [phone] or [place] office [phone].

## 2022-08-12 ENCOUNTER — Ambulatory Visit: Payer: Self-pay

## 2022-08-17 NOTE — Progress Notes (Addendum)
[address]       [place] [address]             [phone]  HPI: This is a 47 year old male who is s/p CABG X 2 (LIMA LAD, RSVG Diagonal and  aortic valve replacement with a 23mm On-X valve) by Dr. Cliffton Asters on 06/28/2022. He presents today for post op follow up. He states he had incisional/sternal pain and some shortness of breath, but both of these symptoms have improved. Current Outpatient Medications  Medication Sig Dispense Refill   atorvastatin (LIPITOR) 80 MG tablet Take 1 tablet (80 mg total) by mouth daily. 30 tablet 1   Buprenorphine HCl-Naloxone HCl 8-2 MG FILM Place 1 Film under the tongue 2 (two) times daily.     gabapentin (NEURONTIN) 300 MG capsule Take 1 capsule (300 mg total) by mouth 3 (three) times daily. 90 capsule 0   HUMALOG KWIKPEN 100 UNIT/ML KwikPen Inject 4-9 Units into the skin 3 (three) times daily. Sliding scale (go up by 1 unit)     insulin glargine (LANTUS) 100 UNIT/ML injection Inject 10 Units into the skin at bedtime.     methocarbamol (ROBAXIN) 500 MG tablet Take 1 tablet (500 mg total) by mouth every 8 (eight) hours as needed for muscle spasms. 90 tablet 0   metoprolol tartrate (LOPRESSOR) 25 MG tablet Take 0.5 tablets (12.5 mg total) by mouth 2 (two) times daily. 60 tablet 1   Pancrelipase, Lip-Prot-Amyl, (CREON) 24000-76000 units CPEP Take 3 capsules (72,000 Units total) by mouth 3 (three) times daily. 200 capsule 1   warfarin (COUMADIN) 2.5 MG tablet Take 1 tablet (2.5 mg total) by mouth daily at 4 PM. Or as directed 30 tablet 1  Vital Signs: Vitals:   08/26/22 1346  BP: 98/63  Pulse: 81  Resp: 20  SpO2: 96%    Physical Exam: CV-RRR, sharp valve click Pulmonary-Clear to auscultation bilaterally Abdomen-Soft, non tender, bowel sounds present Extremities-No LE edema Wounds-Clean, dry, well healed  Diagnostic Tests:  Narrative & Impression  CLINICAL DATA:  CABG. Valve replacement. 06/28/2022. No complaints today.    EXAM: CHEST - 2 VIEW   COMPARISON:  Chest radiographs 07/03/2022 cough for 06/16/2022, 06/28/2022, 06/27/2022   FINDINGS: Redemonstration of median sternotomy, CABG, and cardiac valve replacement. Cardiac silhouette and mediastinal contours are within normal limits. The lungs are clear. Resolution of the prior bilateral lower lung heterogeneous airspace opacities. Resolution of the prior mild interstitial edema. Interval decrease in size of left pleural effusion. Likely resolution of the prior tiny right pleural effusion. No pneumothorax. No acute skeletal abnormality.   IMPRESSION: 1. Resolution of the prior bilateral lower lung heterogeneous airspace opacities. 2. Resolution of the prior mild interstitial edema. 3. Interval decrease in size of the prior left pleural effusion.     Electronically Signed   By: Neita Garnet M.D.   On: 08/26/2022 13:43    Impression and Plan: His BP is a little lower than usual for him. He is not feeling dizzy. After reviewing his medical record and previous BP's, he was instructed to continue with Lopressor 12.5 mg bid. He is on Coumadin for his mechanical AVR and it was checked yesterday;INR was up to 2.5. He is to have it checked again in a few weeks. I did discuss he needs to be taking ec asa 81 mg daily (as has CAD) and he stated he has it and will start taking today. We discussed participation in cardiac rehab  but he is not interested. We discussed the continuance of sternal precautions until 08/28/2022. He wishes to return to the gym. I instructed him to start out with lower weights and gradually increase as tolerates. He will wait a few more weeks to do push ups. He did quit smoking initially post op but is smoking again (although not as much as before). He was encouraged to try to quit completely. He was instructed on the importance of diabetes management and surveillance of his HGA1C (his was 7.9 prior to surgery). He states has an appointment  to see his medical doctor soon. He will return to see [facility]CTS PRN and continued to be followed by cardiology.

## 2022-08-24 ENCOUNTER — Other Ambulatory Visit: Payer: Self-pay | Admitting: Physician Assistant

## 2022-08-24 ENCOUNTER — Ambulatory Visit: Payer: Medicare Other | Attending: Cardiology | Admitting: Cardiology

## 2022-08-24 ENCOUNTER — Other Ambulatory Visit: Payer: Self-pay | Admitting: *Deleted

## 2022-08-24 ENCOUNTER — Encounter: Payer: Self-pay | Admitting: Cardiology

## 2022-08-24 VITALS — BP 106/64 | HR 85 | Ht 72.0 in | Wt 129.8 lb

## 2022-08-24 DIAGNOSIS — Z720 Tobacco use: Secondary | ICD-10-CM

## 2022-08-24 DIAGNOSIS — G894 Chronic pain syndrome: Secondary | ICD-10-CM | POA: Diagnosis not present

## 2022-08-24 DIAGNOSIS — Z794 Long term (current) use of insulin: Secondary | ICD-10-CM

## 2022-08-24 DIAGNOSIS — D649 Anemia, unspecified: Secondary | ICD-10-CM

## 2022-08-24 DIAGNOSIS — Z952 Presence of prosthetic heart valve: Secondary | ICD-10-CM | POA: Diagnosis not present

## 2022-08-24 DIAGNOSIS — I255 Ischemic cardiomyopathy: Secondary | ICD-10-CM | POA: Diagnosis not present

## 2022-08-24 DIAGNOSIS — I2510 Atherosclerotic heart disease of native coronary artery without angina pectoris: Secondary | ICD-10-CM

## 2022-08-24 DIAGNOSIS — E1165 Type 2 diabetes mellitus with hyperglycemia: Secondary | ICD-10-CM

## 2022-08-24 DIAGNOSIS — Z7901 Long term (current) use of anticoagulants: Secondary | ICD-10-CM

## 2022-08-24 DIAGNOSIS — Z5181 Encounter for therapeutic drug level monitoring: Secondary | ICD-10-CM

## 2022-08-24 DIAGNOSIS — Z951 Presence of aortocoronary bypass graft: Secondary | ICD-10-CM

## 2022-08-24 MED ORDER — WARFARIN SODIUM 2.5 MG PO TABS
ORAL_TABLET | ORAL | 0 refills | Status: DC
Start: 2022-08-24 — End: 2022-08-31

## 2022-08-24 MED ORDER — ATORVASTATIN CALCIUM 80 MG PO TABS: 80.0000 mg | ORAL_TABLET | Freq: Every day | ORAL | 1 refills | Status: DC

## 2022-08-24 NOTE — Progress Notes (Signed)
Cardiology Office Note:   Date:  08/24/2022  ID:  Ian Gross, DOB Mar 18, 1976, MRN [id number]  History of Present Illness:   Ian Gross is a 47 y.o. 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  He is admitted to [facility] on 06/15/2022 for centralized chest pain where his original EKG showed sinus rhythm at 78 bpm with ST elevation in anterior leads.  He was taken emergently to the cardiac Cath Lab where his catheterization revealed severe proximal LAD disease along with a moderate first diagonal branch and extending back into the ostium, the ramus intermedius and left circumflex.  PCI was too high risk and the patient will be better served by CABG.  After his catheterization he was pain-free and hemodynamically stable.  IV heparin was restarted 2 hours after TR band and he was transferred to the ICU at [facility].  He underwent echocardiogram which revealed moderate to severe AR.  The patient had poor dentition which required dental consultation with multiple teeth that had to be extracted.  He underwent CABG x 2 (LIMA to the LAD, R SVG to the diagonal) with mechanical AVR.  Postop blood loss anemia requiring transfusion.  He was started on Coumadin on 4/2 PT and INR monitored daily.  He was considered stable for discharge 07/05/22.  He was last seen in clinic 07/21/2022 stating that overall he was doing fairly well but was found to be hypotensive with blood pressure in the 80s.  He continues have discomfort and pain right along the incision that was improving daily.  He did have upcoming appointments to be released from CV surgery.  He  was sent for follow-up CBC and was advised to continue to keep his postoperative chest x-ray appointment and after he would be referred to cardiac rehab once CVTS released him.  He returns to clinic today stating that overall he has been feeling well.  Blood pressure has been better.  Unfortunately after his last visit he was scheduled labs which were not completed due to him feeling so poorly.  He continues to deny it some occasional chest tenderness to due to healing but denies any chest pain or shortness of breath.  He has occasionally noted a little bit of swelling in his ankles that is short-lived.  States that he continues to sleep in the chair is not yet comfortable sleeping in the bed.  Appetite has improved and sugar has been a little more difficult to maintain.  Denies any recent hospitalization or visits to the emergency department.  ROS: 10 point review of system has been completed and considered negative with exception of what is listed in HPI  Studies Reviewed:    EKG: No new tracings have been completed today  TTE 08/09/22  1. Left ventricular ejection fraction, by estimation, is 40 to 45%. The  left ventricle has mildly decreased function. The left ventricle  demonstrates regional wall motion abnormalities (see scoring  diagram/findings for description). There is mild left  ventricular hypertrophy. Left ventricular diastolic parameters were  normal. There is severe hypokinesis of the left ventricular, mid-apical  anteroseptal wall and anterior wall.   2. Right ventricular systolic function is normal. The right ventricular  size is normal. Tricuspid regurgitation signal is inadequate  for assessing  PA pressure.   3. The mitral valve is normal in structure. No evidence of mitral valve  regurgitation. No evidence of mitral stenosis.   4. The aortic valve has been repaired/replaced. Aortic valve  regurgitation is not visualized. No aortic stenosis is present. Echo  findings are  consistent with normal structure and function of the aortic  valve prosthesis.   TTE 06/16/22 1. Left ventricular ejection fraction, by estimation, is 40%. The left  ventricle has mild to moderately decreased function. The left ventricle  demonstrates regional wall motion abnormalities with mid to apical  anteroseptal and inferoseptal akinesis and  anterior hypokinesis. The left ventricular internal cavity size was mildly  dilated. Left ventricular diastolic parameters were normal.   2. Right ventricular systolic function is normal. The right ventricular  size is normal. Tricuspid regurgitation signal is inadequate for assessing  PA pressure.   3. The mitral valve is normal in structure. No evidence of mitral valve  regurgitation. No evidence of mitral stenosis.   4. The aortic valve is probaby tricuspid though not visualized ideally.  There is mild calcification of the aortic valve. Aortic valve  regurgitation is moderate to severe. There was no holodiastolic flow  reversal noted in the descending thoracic aorta.  No aortic stenosis is present.   5. The inferior vena cava is normal in size with <50% respiratory  variability, suggesting right atrial pressure of 8 mmHg.    LHC 06/15/22 Conclusions: Severe complex ostial/proximal LAD involving moderate-caliber D1 that is difficult to visualize due to vessel overlap.  Lesion appears to be an acute plaque rupture with up to 90% stenosis involving the LAD and 75% stenosis involving D1 leading to the patient's NSTEMI.  LMCA, ramus intermedius, LCx, and RCA are without significant disease. Mildly reduced left ventricular systolic function with mild mid and apical anterior and apical inferior hypokinesis (LVEF 45-50%). Normal left ventricular filling pressure (LVEDP 6 mmHg).   Recommendations: Case reviewed with Dr. Herbie Baltimore (interventional cardiology) and Dr. Cliffton Asters (cardiothoracic surgery).  Given proximal LAD disease involving sizeable D1  and adjacent ramus intermedius branch, PCI would be high risk.  In the setting of his DM and LAD/D1 disease, we have agreed that CABG may be the best revascularization option.  Given that Mr. Abbs is currently chest pain free and hemodynamically stable, we will transfer him to [facility] for urgent evaluation and possible CABG as soon as tomorrow. Start IV heparin 2 hours after TR band deflation. High-intensity statin therapy. Aspirin 81 mg daily; defer P2Y12 inhibitor pending cardiac surgery evaluation. Start metoprolol tartrate 12.5 mg twice daily.  Risk Assessment/Calculations:              Physical Exam:   VS:  BP 106/64 (BP Location: Left Arm, Patient Position: Sitting, Cuff Size: Normal)   Pulse 85   Ht 6' (1.829 m)   Wt 129 lb 12.8 oz (58.9 kg)   SpO2 99%   BMI 17.60 kg/m    Wt Readings from Last 3 Encounters:  08/24/22 129 lb 12.8 oz (58.9 kg)  07/21/22 132 lb (59.9 kg)  07/13/22 127 lb (57.6 kg)     GEN: Well nourished, well developed in no acute distress NECK: No JVD; No carotid bruits CARDIAC: RRR, no murmurs , rubs, gallops RESPIRATORY:  Clear to auscultation without rales, wheezing or rhonchi  ABDOMEN: Soft, non-tender, non-distended EXTREMITIES:  No edema; No deformity   ASSESSMENT AND PLAN:   Coronary artery disease status post CABG x 2  and AVR.  And recently discharge from [facility] on 07/05/2022.  He continues to have some incisional discomfort that is improving.  Continues to sleep in the recliner versus the bed.  Has a chest x-ray later this week and follow-up with surgery to determine if he will be released for cardiac rehab at that time.  He is continued on atorvastatin 80 mg daily and warfarin and lieu of aspirin for his aortic valve replacement.  He does request refills of his statin and warfarin today.  Previous episode of hypotension that is improved today with a blood pressure of 106/64.  He is continued on metoprolol to tartrate 12.5 mg twice daily.  He  is tolerating the twice daily dosing of the metoprolol to tartrate well at this time.  Will consider consolidating and changing to Toprol XL in the future.  Ischemic cardiomyopathy with recent echocardiogram completed 08/09/2022 which revealed a slight improvement in his LVEF of 40-45%.  He is continued on his metoprolol.  Will continue to try to escalate medication but unfortunately due to softer blood pressures today escalation is limited.  Anemia during hospitalization which required transfusion with the last recorded hemoglobin of 7.1.  He is scheduled for CBC today previous visit he was ordered for labs but unfortunately those were not completed.  Type 2 diabetes that continues to be managed by his PCP.  Chronic pain syndrome where he is continued on gabapentin and buprenorphine -naloxone.   Previous history of smoking and marijuana use for total cessation being recommended.  Disposition patient return to clinic to see MD/APP in 2 months or sooner if needed to reevaluate symptoms and to continue to try to escalate medications for LVEF of 40-45%.   Cardiac Rehabilitation Eligibility Assessment  The patient is ready to start cardiac rehabilitation pending clearance from the cardiac surgeon.        Signed, Mallori Araque, NP

## 2022-08-24 NOTE — Patient Instructions (Signed)
Medication Instructions:   Your physician recommends that you continue on your current medications as directed. Please refer to the Current Medication list given to you today.  *If you need a refill on your cardiac medications before your next appointment, please call your pharmacy*   Lab Work:  Your physician recommends that you return for lab work Tomorrow.  BMP/CBC   - Please go to the [facility]. You will check in at the front desk to the right as you walk into the atrium. Valet Parking is offered if needed. - No appointment needed. You may go any day between 7 am and 6 pm.   If you have labs (blood work) drawn today and your tests are completely normal, you will receive your results only by: MyChart Message (if you have MyChart) OR A paper copy in the mail If you have any lab test that is abnormal or we need to change your treatment, we will call you to review the results.   Testing/Procedures:  NONE   Follow-Up: At [facility], you and your health needs are our priority.  As part of our continuing mission to provide you with exceptional heart care, we have created designated Provider Care Teams.  These Care Teams include your primary Cardiologist (physician) and Advanced Practice Providers (APPs -  Physician Assistants and Nurse Practitioners) who all work together to provide you with the care you need, when you need it.  We recommend signing up for the patient portal called "MyChart".  Sign up information is provided on this After Visit Summary.  MyChart is used to connect with patients for Virtual Visits (Telemedicine).  Patients are able to view lab/test results, encounter notes, upcoming appointments, etc.  Non-urgent messages can be sent to your provider as well.   To learn more about what you can do with MyChart, go to [email or web address].    Your next appointment:   2 month(s)  Provider:   You may see Yvonne Kendall, MD or one of the following  Advanced Practice Providers on your designated Care Team:   Nicolasa Ducking, NP Eula Listen, PA-C Cadence Fransico Michael, PA-C Charlsie Quest, NP

## 2022-08-25 ENCOUNTER — Other Ambulatory Visit: Payer: Self-pay | Admitting: Thoracic Surgery (Cardiothoracic Vascular Surgery)

## 2022-08-25 ENCOUNTER — Ambulatory Visit: Payer: Medicare Other | Attending: Internal Medicine

## 2022-08-25 DIAGNOSIS — Z951 Presence of aortocoronary bypass graft: Secondary | ICD-10-CM

## 2022-08-25 DIAGNOSIS — Z5181 Encounter for therapeutic drug level monitoring: Secondary | ICD-10-CM

## 2022-08-25 DIAGNOSIS — Z952 Presence of prosthetic heart valve: Secondary | ICD-10-CM | POA: Diagnosis not present

## 2022-08-25 LAB — POCT INR: INR: 2.5 (ref 2.0–3.0)

## 2022-08-25 NOTE — Patient Instructions (Signed)
Continue 1 tablet daily, except 2 tablets every Wednesday Recheck in 4 weeks.  Call with new medications or bleeding problems [facility] [phone] or [place] office [phone].

## 2022-08-26 ENCOUNTER — Encounter: Payer: Self-pay | Admitting: Physician Assistant

## 2022-08-26 ENCOUNTER — Ambulatory Visit
Admission: RE | Admit: 2022-08-26 | Discharge: 2022-08-26 | Disposition: A | Discharge status: Home / Self Care | Payer: Medicare Other | Source: Ambulatory Visit | Attending: Thoracic Surgery (Cardiothoracic Vascular Surgery) | Admitting: Thoracic Surgery (Cardiothoracic Vascular Surgery)

## 2022-08-26 ENCOUNTER — Ambulatory Visit (INDEPENDENT_AMBULATORY_CARE_PROVIDER_SITE_OTHER): Payer: Self-pay | Admitting: Physician Assistant

## 2022-08-26 VITALS — BP 98/63 | HR 81 | Resp 20 | Ht 72.0 in | Wt 131.0 lb

## 2022-08-26 DIAGNOSIS — Z09 Encounter for follow-up examination after completed treatment for conditions other than malignant neoplasm: Secondary | ICD-10-CM

## 2022-08-26 DIAGNOSIS — J90 Pleural effusion, not elsewhere classified: Secondary | ICD-10-CM | POA: Diagnosis not present

## 2022-08-26 DIAGNOSIS — Z951 Presence of aortocoronary bypass graft: Secondary | ICD-10-CM

## 2022-08-26 DIAGNOSIS — J811 Chronic pulmonary edema: Secondary | ICD-10-CM | POA: Diagnosis not present

## 2022-08-26 NOTE — Patient Instructions (Addendum)
You are encouraged to enroll and participate in the outpatient cardiac rehab program beginning as soon as practical. 2. Continue to avoid any heavy lifting or strenuous use of your arms or shoulders for at least a total of three months from the time of surgery.  After three months you may gradually increase how much you lift or otherwise use your arms or chest as tolerated, with limits based upon whether or not activities lead to the return of significant discomfort. Endocarditis is a potentially serious infection of heart valves or inside lining of the heart.  It occurs more commonly in patients with diseased heart valves (such as patient's with aortic or mitral valve disease) and in patients who have undergone heart valve repair or replacement.  Certain surgical and dental procedures may put you at risk, such as dental cleaning, other dental procedures, or any surgery involving the respiratory, urinary, gastrointestinal tract, gallbladder or prostate gland.   To minimize your chances for develooping endocarditis, maintain good oral health and seek prompt medical attention for any infections involving the mouth, teeth, gums, skin or urinary tract.    Always notify your doctor or dentist about your underlying heart valve condition before having any invasive procedures. You will need to take antibiotics before certain procedures, including all routine dental cleanings or other dental procedures.  Your cardiologist or dentist should prescribe these antibiotics for you to be taken ahead of time.   3. Make every effort to keep your diabetes under very tight control.  Follow up closely with your primary care physician or endocrinologist and strive to keep their hemoglobin A1c levels as low as possible, preferably near or below 6.0.  The long term benefits of strict control of diabetes are far reaching and critically important for your overall health and survival. Of note, his pre op HGA1C was 7.9. He has  follow up in June with PCP  4. Please start enteric coated aspirin 81 mg daily

## 2022-08-31 ENCOUNTER — Other Ambulatory Visit: Payer: Self-pay | Admitting: Cardiology

## 2022-08-31 DIAGNOSIS — Z952 Presence of prosthetic heart valve: Secondary | ICD-10-CM

## 2022-08-31 DIAGNOSIS — Z7901 Long term (current) use of anticoagulants: Secondary | ICD-10-CM

## 2022-08-31 DIAGNOSIS — Z5181 Encounter for therapeutic drug level monitoring: Secondary | ICD-10-CM

## 2022-08-31 NOTE — Telephone Encounter (Signed)
Please review

## 2022-08-31 NOTE — Telephone Encounter (Signed)
Warfarin 2.5mg  refill Aortic valve replaced  Last INR 08/25/22 Last OV 08/24/22

## 2022-09-03 DIAGNOSIS — R52 Pain, unspecified: Secondary | ICD-10-CM | POA: Diagnosis not present

## 2022-09-04 DIAGNOSIS — R52 Pain, unspecified: Secondary | ICD-10-CM | POA: Diagnosis not present

## 2022-09-04 DIAGNOSIS — Z720 Tobacco use: Secondary | ICD-10-CM | POA: Diagnosis not present

## 2022-09-04 DIAGNOSIS — Z79899 Other long term (current) drug therapy: Secondary | ICD-10-CM | POA: Diagnosis not present

## 2022-09-04 DIAGNOSIS — K861 Other chronic pancreatitis: Secondary | ICD-10-CM | POA: Diagnosis not present

## 2022-09-08 ENCOUNTER — Encounter: Payer: Self-pay | Admitting: *Deleted

## 2022-09-10 DIAGNOSIS — E139 Other specified diabetes mellitus without complications: Secondary | ICD-10-CM | POA: Diagnosis not present

## 2022-09-10 DIAGNOSIS — E119 Type 2 diabetes mellitus without complications: Secondary | ICD-10-CM | POA: Diagnosis not present

## 2022-09-10 DIAGNOSIS — Z794 Long term (current) use of insulin: Secondary | ICD-10-CM | POA: Diagnosis not present

## 2022-09-22 ENCOUNTER — Ambulatory Visit: Payer: Medicare Other | Attending: Internal Medicine

## 2022-09-22 DIAGNOSIS — Z5181 Encounter for therapeutic drug level monitoring: Secondary | ICD-10-CM | POA: Diagnosis not present

## 2022-09-22 DIAGNOSIS — Z952 Presence of prosthetic heart valve: Secondary | ICD-10-CM

## 2022-09-22 LAB — POCT INR: INR: 3.7 — AB (ref 2.0–3.0)

## 2022-09-22 NOTE — Patient Instructions (Signed)
DECREASE to  1 tablet daily, except 1.5 tablets every Wednesday Recheck in 3  weeks.  Call with new medications or bleeding problems [facility] [phone] or [place] office [phone].

## 2022-09-28 ENCOUNTER — Other Ambulatory Visit: Payer: Self-pay | Admitting: Internal Medicine

## 2022-09-29 NOTE — Telephone Encounter (Signed)
Requested medication (s) are due for refill today - yes  Requested medication (s) are on the active medication list -yes  Future visit scheduled -yes  Last refill: 03/11/22 #200 1RF  Notes to clinic: off protocol- provider review   Requested Prescriptions  Pending Prescriptions Disp Refills   CREON 24000-76000 units CPEP [Pharmacy Med Name: CREON DR 24,000 UNIT CAPSULE] 200 capsule 1    Sig: Take 3 capsules (72,000 Units total) by mouth 3 (three) times daily.     Off-Protocol Failed - 09/28/2022  3:09 PM      Failed - Medication not assigned to a protocol, review manually.      Passed - Valid encounter within last 12 months    Recent Outpatient Visits           2 months ago Postoperative anemia due to acute blood loss   [place] [facility] [place], Salvadore Oxford, NP   9 months ago Screening for colon cancer   [place] [facility] [place], Salvadore Oxford, NP   10 months ago Boil of buttock   [place] [facility] Smitty Cords, DO   1 year ago Boil of buttock   [place] [facility] [place], Salvadore Oxford, NP       Future Appointments             In 1 week Sampson Si, Salvadore Oxford, NP [place] [facility]   In 3 weeks Shea Evans, Raymon Mutton, PA-C [place] [facility]               Requested Prescriptions  Pending Prescriptions Disp Refills   CREON 24000-76000 units CPEP [Pharmacy Med Name: CREON DR 24,000 UNIT CAPSULE] 200 capsule 1    Sig: Take 3 capsules (72,000 Units total) by mouth 3 (three) times daily.     Off-Protocol Failed - 09/28/2022  3:09 PM      Failed - Medication not assigned to a protocol, review manually.      Passed - Valid encounter within last 12 months    Recent Outpatient Visits           2 months ago Postoperative anemia due to acute blood loss   [place] [facility] [place], Salvadore Oxford, NP   9 months ago Screening for colon cancer   [facility] [place], Salvadore Oxford, NP   10 months ago Boil of buttock   [place] [facility] Smitty Cords, DO   1 year ago Boil of buttock   [place] [facility] [place], Salvadore Oxford, NP       Future Appointments             In 1 week Sampson Si, Salvadore Oxford, NP [place] [facility]   In 3 weeks Shea Evans, Raymon Mutton, PA-C  [facility] at [facility]

## 2022-09-30 ENCOUNTER — Other Ambulatory Visit: Payer: Self-pay | Admitting: Physician Assistant

## 2022-10-01 DIAGNOSIS — Z79899 Other long term (current) drug therapy: Secondary | ICD-10-CM | POA: Diagnosis not present

## 2022-10-01 DIAGNOSIS — R52 Pain, unspecified: Secondary | ICD-10-CM | POA: Diagnosis not present

## 2022-10-02 DIAGNOSIS — K861 Other chronic pancreatitis: Secondary | ICD-10-CM | POA: Diagnosis not present

## 2022-10-02 DIAGNOSIS — Z79899 Other long term (current) drug therapy: Secondary | ICD-10-CM | POA: Diagnosis not present

## 2022-10-02 DIAGNOSIS — R52 Pain, unspecified: Secondary | ICD-10-CM | POA: Diagnosis not present

## 2022-10-02 DIAGNOSIS — Z720 Tobacco use: Secondary | ICD-10-CM | POA: Diagnosis not present

## 2022-10-05 DIAGNOSIS — Z794 Long term (current) use of insulin: Secondary | ICD-10-CM | POA: Diagnosis not present

## 2022-10-05 DIAGNOSIS — E119 Type 2 diabetes mellitus without complications: Secondary | ICD-10-CM | POA: Diagnosis not present

## 2022-10-12 ENCOUNTER — Ambulatory Visit (INDEPENDENT_AMBULATORY_CARE_PROVIDER_SITE_OTHER): Payer: Medicare Other | Admitting: Internal Medicine

## 2022-10-12 ENCOUNTER — Encounter: Payer: Self-pay | Admitting: Internal Medicine

## 2022-10-12 VITALS — BP 106/62 | HR 78 | Temp 96.8°F | Wt 132.0 lb

## 2022-10-12 DIAGNOSIS — R636 Underweight: Secondary | ICD-10-CM | POA: Diagnosis not present

## 2022-10-12 DIAGNOSIS — E1165 Type 2 diabetes mellitus with hyperglycemia: Secondary | ICD-10-CM | POA: Diagnosis not present

## 2022-10-12 DIAGNOSIS — Z9081 Acquired absence of spleen: Secondary | ICD-10-CM

## 2022-10-12 DIAGNOSIS — I2510 Atherosclerotic heart disease of native coronary artery without angina pectoris: Secondary | ICD-10-CM | POA: Diagnosis not present

## 2022-10-12 DIAGNOSIS — I700 Atherosclerosis of aorta: Secondary | ICD-10-CM | POA: Insufficient documentation

## 2022-10-12 DIAGNOSIS — I252 Old myocardial infarction: Secondary | ICD-10-CM | POA: Diagnosis not present

## 2022-10-12 DIAGNOSIS — G894 Chronic pain syndrome: Secondary | ICD-10-CM

## 2022-10-12 DIAGNOSIS — Z952 Presence of prosthetic heart valve: Secondary | ICD-10-CM

## 2022-10-12 DIAGNOSIS — Z90410 Acquired total absence of pancreas: Secondary | ICD-10-CM

## 2022-10-12 DIAGNOSIS — Z794 Long term (current) use of insulin: Secondary | ICD-10-CM

## 2022-10-12 NOTE — Assessment & Plan Note (Signed)
Lipid profile reviewed Encouraged to consume a low-fat diet Continue atorvastatin and Coumadin

## 2022-10-12 NOTE — Assessment & Plan Note (Signed)
Continue Coumadin. 

## 2022-10-12 NOTE — Assessment & Plan Note (Signed)
Continue Suboxone 

## 2022-10-12 NOTE — Assessment & Plan Note (Signed)
He will continue to follow with [facility]

## 2022-10-12 NOTE — Progress Notes (Signed)
Subjective:    Patient ID: Ian Gross, male    DOB: [birth year], 47 y.o.   MRN: [id number]  HPI  Patient presents to clinic today for follow-up of chronic conditions.  DM2: His last A1c was 7.0, 08/2022.  He is taking lantus and humalog as prescribed.  He checks his sugars intermittently.  He checks his feet intermittently.  His last eye exam was > 1 year.  Flu never.  Pneumovax 04/2019.  COVID never.  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  He follows with the suboxone clinic.  History of pancreatectomy, splenectomy: He takes creon as prescribed.  He follows with [facility].  HLD with aortic atherosclerosis/CAD status post MI, aortic valve replacement: His last LDL was 38, triglycerides 58, 05/2022.  He denies myalgias on atorvastatin.  He is taking metoprolol and coumadin as prescribed.  He follows with cardiology.  Review of Systems     Past Medical History:  Diagnosis Date   Alcoholic pancreatitis    s/p pancreatectomy   Diabetes mellitus with complication (HCC)    H/O splenectomy     Current Outpatient Medications  Medication Sig Dispense Refill   atorvastatin (LIPITOR) 80 MG tablet Take 1 tablet (80 mg total) by mouth daily. 30 tablet 1   Buprenorphine HCl-Naloxone HCl 8-2 MG FILM Place 1 Film under the tongue 2 (two) times daily.     gabapentin (NEURONTIN) 300 MG capsule Take 1 capsule (300 mg total) by mouth 3 (three) times daily. 90 capsule 0   HUMALOG KWIKPEN 100 UNIT/ML KwikPen Inject 4-9 Units into the skin 3 (three) times daily. Sliding scale (go up by 1 unit)     insulin glargine (LANTUS) 100 UNIT/ML injection Inject 10 Units into the skin at bedtime.     methocarbamol (ROBAXIN) 500 MG tablet Take 1 tablet (500 mg total) by mouth every 8 (eight) hours as needed for muscle spasms. 90 tablet 0   metoprolol tartrate (LOPRESSOR) 25 MG tablet Take 0.5 tablets (12.5 mg total) by mouth 2 (two) times daily. 60 tablet 1   Pancrelipase, Lip-Prot-Amyl,  (CREON) 24000-76000 units CPEP TAKE 3 CAPSULES (72,000 UNITS TOTAL) BY MOUTH 3 (THREE) TIMES DAILY. 810 capsule 1   warfarin (COUMADIN) 2.5 MG tablet TAKE 1 TO 2 TABLETS BY MOUTH DAILY OR AS DIRECTED BY COUMADIN CLINIC 100 tablet 0   No current facility-administered medications for this visit.    No Known Allergies  Family History  Problem Relation Age of Onset   Lung cancer Father    Healthy Sister    Healthy Brother    CVA Maternal Grandmother    Liver cancer Maternal Grandfather    Alcohol abuse Maternal Grandfather     Social History   Socioeconomic History   Marital status: Single    Spouse name: Not on file   Number of children: 2   Years of education: Not on file   Highest education level: Not on file  Occupational History   Occupation: Disable  Tobacco Use   Smoking status: Some Days    Current packs/day: 0.75    Types: Cigarettes   Smokeless tobacco: Never  Vaping Use   Vaping status: Never Used  Substance and Sexual Activity   Alcohol use: Not Currently   Drug use: Not Currently   Sexual activity: Not on file  Other Topics Concern   Not on file  Social History Narrative   Not on file   Social Determinants of Health   Financial Resource Strain: Low  Risk  (09/03/2021)   Overall Financial Resource Strain (CARDIA)    Difficulty of Paying Living Expenses: Not hard at all  Food Insecurity: No Food Insecurity (07/07/2022)   Hunger Vital Sign    Worried About Running Out of Food in the Last Year: Never true    Ran Out of Food in the Last Year: Never true  Transportation Needs: No Transportation Needs (07/07/2022)   PRAPARE - Administrator, Civil Service (Medical): No    Lack of Transportation (Non-Medical): No  Physical Activity: Not on file  Stress: No Stress Concern Present (09/03/2021)   [employer] of Occupational Health - Occupational Stress Questionnaire    Feeling of Stress : Only a little  Social Connections: Moderately Isolated  (09/03/2021)   Social Connection and Isolation Panel [NHANES]    Frequency of Communication with Friends and Family: Twice a week    Frequency of Social Gatherings with Friends and Family: More than three times a week    Attends Religious Services: Never    Database administrator or Organizations: No    Attends Engineer, structural: Not on file    Marital Status: Living with partner  Intimate Partner Violence: Not At Risk (09/03/2021)   Humiliation, Afraid, Rape, and Kick questionnaire    Fear of Current or Ex-Partner: No    Emotionally Abused: No    Physically Abused: No    Sexually Abused: No     Constitutional: Denies fever, malaise, fatigue, headache or abrupt weight changes.  HEENT: Denies eye pain, eye redness, ear pain, ringing in the ears, wax buildup, runny nose, nasal congestion, bloody nose, or sore throat. Respiratory: Denies difficulty breathing, shortness of breath, cough or sputum production.   Cardiovascular: Denies chest pain, chest tightness, palpitations or swelling in the hands or feet.  Gastrointestinal: Denies abdominal pain, bloating, constipation, diarrhea or blood in the stool.  GU: Denies urgency, frequency, pain with urination, burning sensation, blood in urine, odor or discharge. Musculoskeletal: Denies decrease in range of motion, difficulty with gait, muscle pain or joint pain and swelling.  Skin: Denies redness, rashes, lesions or ulcercations.  Neurological: Denies dizziness, difficulty with memory, difficulty with speech or problems with balance and coordination.  Psych: Denies anxiety, depression, SI/HI.  No other specific complaints in a complete review of systems (except as listed in HPI above).  Objective:   Physical Exam   BP 106/62 (BP Location: Right Arm, Patient Position: Sitting, Cuff Size: Normal)   Pulse 78   Temp (!) 96.8 F (36 C) (Temporal)   Wt 132 lb (59.9 kg)   SpO2 100%   BMI 17.90 kg/m   Wt Readings from Last 3  Encounters:  08/26/22 131 lb (59.4 kg)  08/24/22 129 lb 12.8 oz (58.9 kg)  07/21/22 132 lb (59.9 kg)    General: Appears his stated age, underweight, in NAD. Skin: Warm, dry and intact.  Slight ruddy discoloration to BLE.  No ulcerations noted. HEENT: Head: normal shape and size; Eyes: sclera white, no icterus, conjunctiva pink, PERRLA and EOMs intact;  Neck:  Neck supple, trachea midline. No masses, lumps or thyromegaly present.  Cardiovascular: Normal rate and rhythm.  Click noted.  No murmur, rubs or gallops noted. No JVD or BLE edema. No carotid bruits noted. Pulmonary/Chest: Normal effort and positive vesicular breath sounds. No respiratory distress. No wheezes, rales or ronchi noted.  Abdomen: Soft and nontender. Normal bowel sounds.  Musculoskeletal:  No difficulty with gait.  Neurological: Alert  and oriented. Coordination normal.  Psychiatric: Mood and affect normal. Behavior is normal. Judgment and thought content normal.    BMET    Component Value Date/Time   NA 134 (L) 07/01/2022 0045   K 3.8 07/01/2022 0045   CL 99 07/01/2022 0045   CO2 30 07/01/2022 0045   GLUCOSE 177 (H) 07/01/2022 0045   BUN 13 07/01/2022 0045   CREATININE 1.13 07/01/2022 0045   CREATININE 0.96 12/09/2021 1055   CALCIUM 8.3 (L) 07/01/2022 0045   GFRNONAA >60 07/01/2022 0045   GFRAA >60 08/01/2019 0509    Lipid Panel     Component Value Date/Time   CHOL 98 06/15/2022 1553   TRIG 58 06/15/2022 1553   HDL 48 06/15/2022 1553   CHOLHDL 2.0 06/15/2022 1553   VLDL 12 06/15/2022 1553   LDLCALC 38 06/15/2022 1553   LDLCALC 30 12/09/2021 1055    CBC    Component Value Date/Time   WBC 16.1 (H) 07/03/2022 0101   RBC 1.87 (L) 07/03/2022 0101   HGB 7.1 (L) 07/03/2022 1145   HCT 21.3 (L) 07/03/2022 1145   PLT 325 07/03/2022 0101   MCV 96.3 07/03/2022 0101   MCH 33.2 07/03/2022 0101   MCHC 34.4 07/03/2022 0101   RDW 13.6 07/03/2022 0101   LYMPHSABS 3.4 06/15/2022 1553   MONOABS 1.3 (H)  06/15/2022 1553   EOSABS 0.3 06/15/2022 1553   BASOSABS 0.1 06/15/2022 1553    Hgb A1C Lab Results  Component Value Date   HGBA1C 7.9 (H) 06/27/2022           Assessment & Plan:      RTC in 3 months for annual exam Nicki Reaper, NP

## 2022-10-12 NOTE — Assessment & Plan Note (Signed)
Lipid profile reviewed Encouraged him to consume a low-fat diet Continue atorvastatin, metoprolol and Coumadin

## 2022-10-12 NOTE — Assessment & Plan Note (Signed)
Encourage high-protein, high-calorie diet Okay to use boost/Ensure supplements as needed

## 2022-10-12 NOTE — Patient Instructions (Signed)

## 2022-10-12 NOTE — Assessment & Plan Note (Signed)
Recent A1c and urine microalbumin reviewed Encouraged consume a low-carb diet Continue Humalog and Lantus Referral to ophthalmology for routine eye exam Encouraged routine foot exam

## 2022-10-12 NOTE — Assessment & Plan Note (Addendum)
Continue Creon He will continue to follow with [facility]

## 2022-10-13 ENCOUNTER — Ambulatory Visit: Payer: Medicare Other | Attending: Internal Medicine

## 2022-10-13 DIAGNOSIS — Z5181 Encounter for therapeutic drug level monitoring: Secondary | ICD-10-CM

## 2022-10-13 DIAGNOSIS — Z952 Presence of prosthetic heart valve: Secondary | ICD-10-CM

## 2022-10-13 LAB — POCT INR: INR: 2.3 (ref 2.0–3.0)

## 2022-10-13 NOTE — Patient Instructions (Signed)
Continue 1 tablet daily, except 1.5 tablets every Wednesday Recheck in 6  weeks.  Call with new medications or bleeding problems Cou[facility]in Clinic [phone] or [place] office [phone].

## 2022-10-22 ENCOUNTER — Other Ambulatory Visit: Payer: Self-pay | Admitting: Cardiology

## 2022-10-26 ENCOUNTER — Ambulatory Visit: Payer: Medicare Other | Attending: Physician Assistant | Admitting: Physician Assistant

## 2022-10-26 ENCOUNTER — Telehealth: Payer: Self-pay | Admitting: *Deleted

## 2022-10-26 ENCOUNTER — Encounter: Payer: Self-pay | Admitting: Physician Assistant

## 2022-10-26 VITALS — BP 108/70 | HR 76 | Ht 72.0 in | Wt 135.2 lb

## 2022-10-26 DIAGNOSIS — I255 Ischemic cardiomyopathy: Secondary | ICD-10-CM

## 2022-10-26 DIAGNOSIS — D649 Anemia, unspecified: Secondary | ICD-10-CM

## 2022-10-26 DIAGNOSIS — Z951 Presence of aortocoronary bypass graft: Secondary | ICD-10-CM | POA: Diagnosis not present

## 2022-10-26 DIAGNOSIS — Z952 Presence of prosthetic heart valve: Secondary | ICD-10-CM

## 2022-10-26 DIAGNOSIS — Z5181 Encounter for therapeutic drug level monitoring: Secondary | ICD-10-CM

## 2022-10-26 DIAGNOSIS — I10 Essential (primary) hypertension: Secondary | ICD-10-CM

## 2022-10-26 DIAGNOSIS — Z720 Tobacco use: Secondary | ICD-10-CM | POA: Diagnosis not present

## 2022-10-26 DIAGNOSIS — I2510 Atherosclerotic heart disease of native coronary artery without angina pectoris: Secondary | ICD-10-CM | POA: Diagnosis not present

## 2022-10-26 DIAGNOSIS — I351 Nonrheumatic aortic (valve) insufficiency: Secondary | ICD-10-CM | POA: Diagnosis not present

## 2022-10-26 MED ORDER — ASPIRIN 81 MG PO TBEC: 81.0000 mg | DELAYED_RELEASE_TABLET | Freq: Every day | ORAL | Status: AC

## 2022-10-26 MED ORDER — METOPROLOL SUCCINATE ER 25 MG PO TB24: 12.5000 mg | ORAL_TABLET | Freq: Every day | ORAL | 3 refills | Status: DC

## 2022-10-26 MED ORDER — PANTOPRAZOLE SODIUM 40 MG PO TBEC: 40.0000 mg | DELAYED_RELEASE_TABLET | Freq: Every day | ORAL | 11 refills | Status: DC

## 2022-10-26 NOTE — Patient Instructions (Addendum)
Medication Instructions:  Your physician has recommended you make the following change in your medication:   STOP Metoprolol tartrate START Metoprolol succinate (Toprol XL) 12.5 mg once daily START Protonix 40 mg once daily  START Aspirin enteric coated 81 mg once daily   *If you need a refill on your cardiac medications before your next appointment, please call your pharmacy*   Lab Work: CBC & BMP today here in our office.   If you have labs (blood work) drawn today and your tests are completely normal, you will receive your results only by: MyChart Message (if you have MyChart) OR A paper copy in the mail If you have any lab test that is abnormal or we need to change your treatment, we will call you to review the results.   Testing/Procedures: None   Follow-Up: At [facility], you and your health needs are our priority.  As part of our continuing mission to provide you with exceptional heart care, we have created designated Provider Care Teams.  These Care Teams include your primary Cardiologist (physician) and Advanced Practice Providers (APPs -  Physician Assistants and Nurse Practitioners) who all work together to provide you with the care you need, when you need it.  We recommend signing up for the patient portal called "MyChart".  Sign up information is provided on this After Visit Summary.  MyChart is used to connect with patients for Virtual Visits (Telemedicine).  Patients are able to view lab/test results, encounter notes, upcoming appointments, etc.  Non-urgent messages can be sent to your provider as well.   To learn more about what you can do with MyChart, go to [email or web address].    Your next appointment:   1 month(s)  Provider:   Yvonne Kendall, MD or Eula Listen, PA-C

## 2022-10-26 NOTE — Telephone Encounter (Signed)
Called patient and requested that he please call back and ask for [facility]

## 2022-10-26 NOTE — Telephone Encounter (Signed)
Left voice mail to call back 

## 2022-10-26 NOTE — Progress Notes (Signed)
Cardiology Office Note    Date:  10/26/2022   ID:  Ian Gross, DOB 09-02-1975, MRN [id number]  PCP:  Lorre Munroe, NP  Cardiologist:  Yvonne Kendall, MD  Electrophysiologist:  None   Chief Complaint: Follow-up  History of Present Illness:   Ian Gross is a 47 y.o. 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  He was admitted to the hospital in 05/2022 with NSTEMI.  LHC showed severe proximal LAD disease along with moderate D1 stenosis extending back into the ostium.  The left main, ramus, LCx, and RCA were without significant disease.  PCI was felt to be too high risk with recommendation for CABG.  LVEF 45 to 50% with normal LVEDP.  Echo showed an EF of 40%, mid to apical anteroseptal and inferoseptal akinesis and anterior hypokinesis, mildly dilated LV internal cavity size, normal LV diastolic function parameters, normal RV systolic function and ventricular cavity size, moderate to severe aortic regurgitation, and an estimated right atrial pressure of 8 mmHg.  He was also noted to have poor dentition with dental consultation leading to multiple extractions prior to cardiothoracic surgery.  He underwent two-vessel CABG with LIMA to LAD and SVG to diagonal with mechanical AVR in 06/2022.  Postoperative course was notable for transfusion dependent anemia as well as hypotension and postoperative pain.  Follow-up echo on 08/09/2022 demonstrated an EF of 40 to 45%, mild LVH, severe hypokinesis of the mid to apical anteroseptal and anterior wall, normal RV systolic function and ventricular cavity size, and normal structure and function of aortic valve  prosthesis.  He was last seen in the office on 08/24/2022 and was without symptoms of angina or cardiac decompensation.  He continued to have some postoperative pain.  Recommended follow-up CBC remains pending.  He comes in doing well from a cardiac perspective and is without symptoms of angina or cardiac decompensation.  He does continue to note fatigue that is slowly improving.  No dizziness, presyncope, or syncope.  No falls, hematochezia, or melena.  Reports adherence to atorvastatin Lopressor, and warfarin.  Currently taking aspirin 81 mg 2 days/week.  Smoking approximately one half a pack of cigarettes daily, working on tapering.  He does add salt to foods.  Drinking approximately 2 L of liquids daily.  Lower extremity swelling and abdominal distention, early satiety, or progressive orthopnea.   Labs independently reviewed: 10/13/2022 - INR 2.3 08/2022 - A1c 7.0 06/2022 - Hgb 7.1, PLT 325, potassium 3.8, BUN 13, serum creatinine 1.13, magnesium 1.8 05/2022 - albumin 3.4, AST 45, ALT normal, TSH normal, LP(a) 19.6, TC 98, TG 58, HDL 48, LDL 38  Past Medical History:  Diagnosis Date   Alcoholic pancreatitis    s/p pancreatectomy   Diabetes mellitus with complication (HCC)    H/O splenectomy     Past Surgical History:  Procedure Laterality Date   AORTIC VALVE REPLACEMENT N/A 06/28/2022   Procedure: AORTIC VALVE REPLACEMENT (AVR) USING ON-X PROSTHETIC HEART VALVE SIZE ;  Surgeon: Corliss Skains, MD;  Location: [facility] OR;  Service: Open Heart Surgery;  Laterality: N/A;   CORONARY ARTERY BYPASS GRAFT N/A 06/28/2022   Procedure: CORONARY ARTERY BYPASS GRAFTING (CABG) X2 USING LEFT INTERNAL MAMMARY ARTERY AND  RIGHT GREATER SAPHENOUS VEIN HARVESTED ENDOSCOPICALLY;  Surgeon: Corliss Skains, MD;  Location: [facility] OR;  Service: Open Heart Surgery;  Laterality: N/A;   DENTAL RESTORATION/EXTRACTION WITH X-RAY N/A 06/23/2022   Procedure: DENTAL RESTORATION/EXTRACTION WITH POSSIBLE X-RAY;  Surgeon:  Ocie Doyne, DMD;  Location: [facility] OR;  Service: Oral Surgery;  Laterality: N/A;   LEFT HEART CATH AND CORONARY ANGIOGRAPHY N/A 06/15/2022   Procedure: LEFT HEART CATH AND CORONARY ANGIOGRAPHY;  Surgeon: Yvonne Kendall, MD;  Location: [facility] INVASIVE CV LAB;  Service: Cardiovascular;  Laterality: N/A;   PANCREATECTOMY N/A    SPLENECTOMY     TEE WITHOUT CARDIOVERSION N/A 06/28/2022   Procedure: TRANSESOPHAGEAL ECHOCARDIOGRAM;  Surgeon: Corliss Skains, MD;  Location: [facility] OR;  Service: Open Heart Surgery;  Laterality: N/A;    Current Medications: Current Meds  Medication Sig   aspirin EC 81 MG tablet Take 1 tablet (81 mg total) by mouth daily. Swallow whole.   atorvastatin (LIPITOR) 80 MG tablet TAKE 1 TABLET BY MOUTH EVERY DAY   Buprenorphine HCl-Naloxone HCl 8-2 MG FILM Place 1 Film under the tongue 2 (two) times daily.   HUMALOG KWIKPEN 100 UNIT/ML KwikPen Inject 4-9 Units into the skin 3 (three) times daily. Sliding scale (go up by 1 unit)   insulin glargine (LANTUS) 100 UNIT/ML injection Inject 10 Units into the skin at bedtime.   metoprolol succinate (TOPROL XL) 25 MG 24 hr tablet Take 0.5 tablets (12.5 mg total) by mouth daily.   Pancrelipase, Lip-Prot-Amyl, (CREON) 24000-76000 units CPEP TAKE 3 CAPSULES (72,000 UNITS TOTAL) BY MOUTH 3 (THREE) TIMES DAILY.   pantoprazole (PROTONIX) 40 MG tablet Take 1 tablet (40 mg total) by mouth daily.   warfarin (COUMADIN) 2.5 MG tablet TAKE 1 TO 2 TABLETS BY MOUTH DAILY OR AS DIRECTED BY COUMADIN CLINIC   [DISCONTINUED] metoprolol tartrate (LOPRESSOR) 25 MG tablet Take 0.5 tablets (12.5 mg total) by mouth 2 (two) times daily.    Allergies:   Patient has no known allergies.   Social History   Socioeconomic History   Marital status: Single    Spouse name: Not on file   Number of children: 2   Years of education: Not on file   Highest education level: Not on file  Occupational History   Occupation: Disable  Tobacco Use   Smoking status:  Some Days    Current packs/day: 0.75    Types: Cigarettes   Smokeless tobacco: Never  Vaping Use   Vaping status: Never Used  Substance and Sexual Activity   Alcohol use: Not Currently   Drug use: Not Currently   Sexual activity: Not on file  Other Topics Concern   Not on file  Social History Narrative   Not on file   Social Determinants of Health   Financial Resource Strain: Low Risk  (09/03/2021)   Overall Financial Resource Strain (CARDIA)    Difficulty of Paying Living Expenses: Not hard at all  Food Insecurity: No Food Insecurity (07/07/2022)   Hunger Vital Sign    Worried About Running Out of Food in the Last Year: Never true    Ran Out of Food in the Last Year: Never true  Transportation Needs: No Transportation Needs (07/07/2022)   PRAPARE - Administrator, Civil Service (Medical): No    Lack of Transportation (Non-Medical): No  Physical Activity: Not on file  Stress: No Stress Concern Present (09/03/2021)   [employer] of Occupational Health - Occupational Stress Questionnaire    Feeling of  Stress : Only a little  Social Connections: Moderately Isolated (09/03/2021)   Social Connection and Isolation Panel [NHANES]    Frequency of Communication with Friends and Family: Twice a week    Frequency of Social Gatherings with Friends and Family: More than three times a week    Attends Religious Services: Never    Database administrator or Organizations: No    Attends Engineer, structural: Not on file    Marital Status: Living with partner     Family History:  The patient's family history includes Alcohol abuse in his maternal grandfather; CVA in his maternal grandmother; Healthy in his brother and sister; Liver cancer in his maternal grandfather; Lung cancer in his father.  ROS:   12-point review of systems is negative unless otherwise noted in the HPI.   EKGs/Labs/Other Studies Reviewed:    Studies reviewed were summarized above. The additional  studies were reviewed today:  2D echo 08/09/2022: 1. Left ventricular ejection fraction, by estimation, is 40 to 45%. The  left ventricle has mildly decreased function. The left ventricle  demonstrates regional wall motion abnormalities (see scoring  diagram/findings for description). There is mild left  ventricular hypertrophy. Left ventricular diastolic parameters were  normal. There is severe hypokinesis of the left ventricular, mid-apical  anteroseptal wall and anterior wall.   2. Right ventricular systolic function is normal. The right ventricular  size is normal. Tricuspid regurgitation signal is inadequate for assessing  PA pressure.   3. The mitral valve is normal in structure. No evidence of mitral valve  regurgitation. No evidence of mitral stenosis.   4. The aortic valve has been repaired/replaced. Aortic valve  regurgitation is not visualized. No aortic stenosis is present. Echo  findings are consistent with normal structure and function of the aortic  valve prosthesis.  __________  TEE 06/28/2022: POST-OP IMPRESSIONS  _ Aortic Valve: No stenosis present. A bileaflet mechanical valve was  placed,  leaflets are freely mobile. There is no regurgitation. Normal washing jets  for  valve type. No perivalvular leak noted.  _ Comments: Aortic valve replaced with normal function. Lv function  improved.  Exam otherwise unchanged.  __________  Pre-CABG vascular ultrasound 06/20/2022: Summary:  Right Carotid: Velocities in the right ICA are consistent with a 1-39%  stenosis.   Left Carotid: Velocities in the left ICA are consistent with a 1-39%  stenosis.  Vertebrals: Bilateral vertebral arteries demonstrate antegrade flow.  Subclavians: Normal flow hemodynamics were seen in bilateral subclavian               arteries.   Right ABI: Resting right ankle-brachial index is within normal range. The  right toe-brachial index is abnormal.  Left ABI: Resting left ankle-brachial  index indicates moderate left lower  extremity arterial disease. The left toe-brachial index is abnormal.  Right Upper Extremity: Doppler waveforms decrease <50% with right radial  compression. Doppler waveform obliterate with right ulnar compression.  Left Upper Extremity: Doppler waveform obliterate with left radial  compression. Doppler waveform obliterate with left ulnar compression.  __________  2D echo 06/16/2022: 1. Left ventricular ejection fraction, by estimation, is 40%. The left  ventricle has mild to moderately decreased function. The left ventricle  demonstrates regional wall motion abnormalities with mid to apical  anteroseptal and inferoseptal akinesis and  anterior hypokinesis. The left ventricular internal cavity size was mildly  dilated. Left ventricular diastolic parameters were normal.   2. Right ventricular systolic function is normal. The right ventricular  size is normal. Tricuspid regurgitation signal is inadequate for assessing  PA pressure.   3. The mitral valve is normal in structure. No evidence of mitral valve  regurgitation. No evidence of mitral stenosis.   4. The aortic valve is probaby tricuspid though not visualized ideally.  There is mild calcification of the aortic valve. Aortic valve  regurgitation is moderate to severe. There was no holodiastolic flow  reversal noted in the descending thoracic aorta.  No aortic stenosis is present.   5. The inferior vena cava is normal in size with <50% respiratory  variability, suggesting right atrial pressure of 8 mmHg.  __________  LHC 06/15/2022: Conclusions: Severe complex ostial/proximal LAD involving moderate-caliber D1 that is difficult to visualize due to vessel overlap.  Lesion appears to be an acute plaque rupture with up to 90% stenosis involving the LAD and 75% stenosis involving D1 leading to the patient's NSTEMI.  LMCA, ramus intermedius, LCx, and RCA are without significant disease. Mildly reduced  left ventricular systolic function with mild mid and apical anterior and apical inferior hypokinesis (LVEF 45-50%). Normal left ventricular filling pressure (LVEDP 6 mmHg).   Recommendations: Case reviewed with Dr. Herbie Baltimore (interventional cardiology) and Dr. Cliffton Asters (cardiothoracic surgery).  Given proximal LAD disease involving sizeable D1 and adjacent ramus intermedius branch, PCI would be high risk.  In the setting of his DM and LAD/D1 disease, we have agreed that CABG may be the best revascularization option.  Given that Mr. Uhlenhake is currently chest pain free and hemodynamically stable, we will transfer him to [facility] for urgent evaluation and possible CABG as soon as tomorrow. Start IV heparin 2 hours after TR band deflation. High-intensity statin therapy. Aspirin 81 mg daily; defer P2Y12 inhibitor pending cardiac surgery evaluation. Start metoprolol tartrate 12.5 mg twice daily.   EKG:  EKG is not ordered today.    Recent Labs: 06/16/2022: TSH 3.113 06/27/2022: ALT 44 06/29/2022: Magnesium 1.8 07/01/2022: BUN 13; Creatinine, Ser 1.13; Potassium 3.8; Sodium 134 07/03/2022: Hemoglobin 7.1; Platelets 325  Recent Lipid Panel    Component Value Date/Time   CHOL 98 06/15/2022 1553   TRIG 58 06/15/2022 1553   HDL 48 06/15/2022 1553   CHOLHDL 2.0 06/15/2022 1553   VLDL 12 06/15/2022 1553   LDLCALC 38 06/15/2022 1553   LDLCALC 30 12/09/2021 1055    PHYSICAL EXAM:    VS:  BP 108/70 (BP Location: Left Arm, Patient Position: Sitting, Cuff Size: Normal)   Pulse 76   Ht 6' (1.829 m)   Wt 135 lb 3.2 oz (61.3 kg)   SpO2 98%   BMI 18.34 kg/m   BMI: Body mass index is 18.34 kg/m.  Physical Exam Vitals reviewed.  Constitutional:      Appearance: He is well-developed.  HENT:     Head: Normocephalic and atraumatic.  Eyes:     General:        Right eye: No discharge.        Left eye: No discharge.  Neck:     Vascular: No JVD.  Cardiovascular:     Rate and Rhythm: Normal rate and  regular rhythm.     Heart sounds: Normal heart sounds, S1 normal and S2 normal. Heart sounds not distant. No midsystolic click and no opening snap. No murmur heard.    No friction rub.     Comments: Mechanical click noted in the right upper sternal border. Pulmonary:     Effort: Pulmonary effort is normal. No respiratory distress.  Breath sounds: Normal breath sounds. No decreased breath sounds, wheezing or rales.  Chest:     Chest wall: No tenderness.  Abdominal:     General: There is no distension.  Musculoskeletal:     Cervical back: Normal range of motion.     Right lower leg: No edema.     Left lower leg: No edema.  Skin:    General: Skin is warm and dry.     Nails: There is no clubbing.  Neurological:     Mental Status: He is alert and oriented to person, place, and time.  Psychiatric:        Speech: Speech normal.        Behavior: Behavior normal.        Thought Content: Thought content normal.        Judgment: Judgment normal.     Wt Readings from Last 3 Encounters:  10/26/22 135 lb 3.2 oz (61.3 kg)  10/12/22 132 lb (59.9 kg)  08/26/22 131 lb (59.4 kg)     ASSESSMENT & PLAN:   CAD status post CABG: He is doing well and without symptoms concerning for angina or cardiac decompensation.  Continue aggressive risk factor modification and secondary prevention including aspirin and atorvastatin.  With going fatigue and relative hypotension we will transition him from Lopressor 12.5 mg twice daily to lower dose Toprol-XL 25 mg daily.  He has declined cardiac rehab.  HFrEF secondary to ICM: Euvolemic and well compensated.  Not requiring standing loop diuretic.  Transition from Lopressor to Toprol-XL as outlined above.  Relative hypotension precludes further escalation of GDMT at this time.  In follow-up, would like to add low-dose ARB if possible with further escalation of GDMT as able moving forward.  Look to repeat limited echo in several months time to evaluate for further  improvement in LV systolic function following surgical revascularization and optimization of evidence-based medical therapy.  Moderate to severe aortic regurgitation status post mechanical AVR: Remains on warfarin which is followed by Coumadin clinic.  Recommend enteric-coated aspirin 81 mg daily.  Will prescribe Protonix with aspirin use given patient concern for GI upset.  SBE prophylaxis indicated for all dental procedures and discussed in detail with patient normal functioning aortic valve prosthesis on echo in 07/2022.  Postoperative anemia: Denies hematochezia or melena.  Check CBC.  HTN: Blood pressure is stable today.  Transition from Lopressor to Toprol-XL.  Tobacco use: Complete cessation is encouraged.  Declines assistance.   Disposition: F/u with Dr. Okey Dupre or an APP in 1 month.   Medication Adjustments/Labs and Tests Ordered: Current medicines are reviewed at length with the patient today.  Concerns regarding medicines are outlined above. Medication changes, Labs and Tests ordered today are summarized above and listed in the Patient Instructions accessible in Encounters.   Signed, Eula Listen, PA-C 10/26/2022 12:54 PM     [facility] [place] [address] [place], [place] [address] [phone]

## 2022-10-27 ENCOUNTER — Other Ambulatory Visit: Payer: Self-pay | Admitting: Cardiology

## 2022-10-27 NOTE — Telephone Encounter (Signed)
Left message to call back  

## 2022-10-27 NOTE — Telephone Encounter (Signed)
Called patient and reviewed AVS instructions and request for labs and follow up appointment. Advised that I did send his AVS to him via mail. He verbalized understanding of all instructions with no further questions at this time.

## 2022-10-29 DIAGNOSIS — Z79899 Other long term (current) drug therapy: Secondary | ICD-10-CM | POA: Diagnosis not present

## 2022-10-29 DIAGNOSIS — R52 Pain, unspecified: Secondary | ICD-10-CM | POA: Diagnosis not present

## 2022-11-01 ENCOUNTER — Telehealth: Payer: Self-pay | Admitting: Internal Medicine

## 2022-11-01 NOTE — Telephone Encounter (Signed)
LVM to sched AWV 11/01/2022  Verlee Rossetti; Care Guide Ambulatory Clinical Support [place] [facility] Direct Dial: [phone]

## 2022-11-23 NOTE — Progress Notes (Signed)
Cardiology Office Note    Date:  11/26/2022   ID:  Ian Gross, DOB 01/20/1976, MRN [id number]  PCP:  Ian Munroe, NP  Cardiologist:  Ian Kendall, MD  Electrophysiologist:  None   Chief Complaint: Follow up  History of Present Illness:   Ian Gross is a 47 y.o. 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   He was admitted to the hospital in 05/2022 with NSTEMI.  LHC showed severe proximal LAD disease along with moderate D1 stenosis extending back into the ostium.  The left main, ramus, LCx, and RCA were without significant disease.  PCI was felt to be too high risk with recommendation for CABG.  LVEF 45 to 50% with normal LVEDP.  Echo showed an EF of 40%, mid to apical anteroseptal and inferoseptal akinesis and anterior hypokinesis, mildly dilated LV internal cavity size, normal LV diastolic function parameters, normal RV systolic function and ventricular cavity size, moderate to severe aortic regurgitation, and an estimated right atrial pressure of 8 mmHg.  He was also noted to have poor dentition with dental consultation leading to multiple extractions prior to cardiothoracic surgery.  He underwent two-vessel CABG with LIMA to LAD and SVG to diagonal with mechanical AVR in 06/2022.  Postoperative course was notable for transfusion dependent anemia as well as hypotension and postoperative pain.  Follow-up echo on 08/09/2022 demonstrated an EF of 40 to 45%, mild LVH, severe hypokinesis of the mid to apical anteroseptal and anterior wall, normal RV systolic function and ventricular cavity size, and normal structure and function of aortic valve  prosthesis.  He was last seen in the office on 10/26/2022 and remained without symptoms of angina or cardiac decompensation.  His fatigue was slowly improving.  He was taking aspirin 2 days/week.  Smoking 1/2 pack daily.  Previously recommended follow-up CBC remained pending.  He comes in doing well from a cardiac perspective and is without symptoms of angina or cardiac decompensation.  He notes a significant improvement in his fatigue following transition from Lopressor to Toprol-XL.  No lower extremity swelling, abdominal distention, early satiety, or progressive orthopnea.  No falls or symptoms concerning for bleeding.  Now taking aspirin 81 mg daily along with warfarin.  Adherent to cardiac medications.  Weight stable.  Continues to smoke 1/2 to 3/4 pack/day.  Overall, he feels well and does not have any acute cardiac concerns at this time.   Labs independently reviewed: 10/13/2022 - INR 2.3 08/2022 - A1c 7.0 06/2022 - Hgb 7.1, PLT 325, potassium 3.8, BUN 13, serum creatinine 1.13, magnesium 1.8 05/2022 - albumin 3.4, AST 45, ALT normal, TSH normal, LP(a) 19.6, TC 98, TG 58, HDL 48, LDL 38  Past Medical History:  Diagnosis Date   Alcoholic pancreatitis    s/p pancreatectomy   Diabetes mellitus with complication (HCC)    H/O splenectomy     Past Surgical History:  Procedure Laterality Date   AORTIC VALVE REPLACEMENT N/A 06/28/2022   Procedure: AORTIC VALVE REPLACEMENT (AVR) USING ON-X PROSTHETIC HEART VALVE SIZE ;  Surgeon: Corliss Skains, MD;  Location: [facility] OR;  Service: Open Heart Surgery;  Laterality: N/A;   CORONARY ARTERY BYPASS GRAFT N/A 06/28/2022   Procedure: CORONARY  ARTERY BYPASS GRAFTING (CABG) X2 USING LEFT INTERNAL MAMMARY ARTERY AND RIGHT GREATER SAPHENOUS VEIN HARVESTED ENDOSCOPICALLY;  Surgeon: Corliss Skains, MD;  Location: [facility] OR;  Service: Open Heart Surgery;  Laterality: N/A;   DENTAL RESTORATION/EXTRACTION WITH X-RAY N/A 06/23/2022   Procedure: DENTAL  RESTORATION/EXTRACTION WITH POSSIBLE X-RAY;  Surgeon: Ocie Doyne, DMD;  Location: [facility] OR;  Service: Oral Surgery;  Laterality: N/A;   LEFT HEART CATH AND CORONARY ANGIOGRAPHY N/A 06/15/2022   Procedure: LEFT HEART CATH AND CORONARY ANGIOGRAPHY;  Surgeon: Ian Kendall, MD;  Location: [facility] INVASIVE CV LAB;  Service: Cardiovascular;  Laterality: N/A;   PANCREATECTOMY N/A    SPLENECTOMY     TEE WITHOUT CARDIOVERSION N/A 06/28/2022   Procedure: TRANSESOPHAGEAL ECHOCARDIOGRAM;  Surgeon: Corliss Skains, MD;  Location: [facility] OR;  Service: Open Heart Surgery;  Laterality: N/A;    Current Medications: Current Meds  Medication Sig   aspirin EC 81 MG tablet Take 1 tablet (81 mg total) by mouth daily. Swallow whole.   atorvastatin (LIPITOR) 80 MG tablet TAKE 1 TABLET BY MOUTH EVERY DAY   Buprenorphine HCl-Naloxone HCl 8-2 MG FILM Place 1 Film under the tongue 2 (two) times daily.   gabapentin (NEURONTIN) 300 MG capsule Take 1 capsule (300 mg total) by mouth 3 (three) times daily.   HUMALOG KWIKPEN 100 UNIT/ML KwikPen Inject 4-9 Units into the skin 3 (three) times daily. Sliding scale (go up by 1 unit)   insulin glargine (LANTUS) 100 UNIT/ML injection Inject 10 Units into the skin at bedtime.   methocarbamol (ROBAXIN) 500 MG tablet Take 1 tablet (500 mg total) by mouth every 8 (eight) hours as needed for muscle spasms.   metoprolol succinate (TOPROL XL) 25 MG 24 hr tablet Take 0.5 tablets (12.5 mg total) by mouth daily.   Pancrelipase, Lip-Prot-Amyl, (CREON) 24000-76000 units CPEP TAKE 3 CAPSULES (72,000 UNITS TOTAL) BY MOUTH 3 (THREE) TIMES DAILY.   pantoprazole (PROTONIX) 40 MG tablet Take 1 tablet (40 mg total) by mouth daily.   warfarin (COUMADIN) 2.5 MG tablet TAKE 1 TO 2 TABLETS BY MOUTH DAILY OR AS DIRECTED BY COUMADIN CLINIC    Allergies:   Patient has no known allergies.   Social History   Socioeconomic History   Marital status: Single    Spouse name: Not on file   Number of  children: 2   Years of education: Not on file   Highest education level: Not on file  Occupational History   Occupation: Disable  Tobacco Use   Smoking status: Some Days    Current packs/day: 0.75    Types: Cigarettes   Smokeless tobacco: Never  Vaping Use   Vaping status: Never Used  Substance and Sexual Activity   Alcohol use: Not Currently   Drug use: Not Currently   Sexual activity: Not on file  Other Topics Concern   Not on file  Social History Narrative   Not on file   Social Determinants of Health   Financial Resource Strain: Low Risk  (09/03/2021)   Overall Financial Resource Strain (CARDIA)    Difficulty of Paying Living Expenses: Not hard at all  Food Insecurity: No Food Insecurity (07/07/2022)   Hunger Vital Sign    Worried About Running Out of Food in the Last Year: Never true    Ran Out of Food in the Last Year: Never true  Transportation Needs: No Transportation Needs (07/07/2022)   PRAPARE - Transportation    Lack of Transportation (Medical): No    Lack  of Transportation (Non-Medical): No  Physical Activity: Not on file  Stress: No Stress Concern Present (09/03/2021)   [employer] of Occupational Health - Occupational Stress Questionnaire    Feeling of Stress : Only a little  Social Connections: Moderately Isolated (09/03/2021)   Social Connection and Isolation Panel [NHANES]    Frequency of Communication with Friends and Family: Twice a week    Frequency of Social Gatherings with Friends and Family: More than three times a week    Attends Religious Services: Never    Database administrator or Organizations: No    Attends Engineer, structural: Not on file    Marital Status: Living with partner     Family History:  The patient's family history includes Alcohol abuse in his maternal grandfather; CVA in his maternal grandmother; Healthy in his brother and sister; Liver cancer in his maternal grandfather; Lung cancer in his father.  ROS:    12-point review of systems is negative unless otherwise noted in the HPI.   EKGs/Labs/Other Studies Reviewed:    Studies reviewed were summarized above. The additional studies were reviewed today:  2D echo 08/09/2022: 1. Left ventricular ejection fraction, by estimation, is 40 to 45%. The  left ventricle has mildly decreased function. The left ventricle  demonstrates regional wall motion abnormalities (see scoring  diagram/findings for description). There is mild left  ventricular hypertrophy. Left ventricular diastolic parameters were  normal. There is severe hypokinesis of the left ventricular, mid-apical  anteroseptal wall and anterior wall.   2. Right ventricular systolic function is normal. The right ventricular  size is normal. Tricuspid regurgitation signal is inadequate for assessing  PA pressure.   3. The mitral valve is normal in structure. No evidence of mitral valve  regurgitation. No evidence of mitral stenosis.   4. The aortic valve has been repaired/replaced. Aortic valve  regurgitation is not visualized. No aortic stenosis is present. Echo  findings are consistent with normal structure and function of the aortic  valve prosthesis.  __________   TEE 06/28/2022: POST-OP IMPRESSIONS  _ Aortic Valve: No stenosis present. A bileaflet mechanical valve was  placed,  leaflets are freely mobile. There is no regurgitation. Normal washing jets  for  valve type. No perivalvular leak noted.  _ Comments: Aortic valve replaced with normal function. Lv function  improved.  Exam otherwise unchanged.  __________   Pre-CABG vascular ultrasound 06/20/2022: Summary:  Right Carotid: Velocities in the right ICA are consistent with a 1-39%  stenosis.   Left Carotid: Velocities in the left ICA are consistent with a 1-39%  stenosis.  Vertebrals: Bilateral vertebral arteries demonstrate antegrade flow.  Subclavians: Normal flow hemodynamics were seen in bilateral subclavian                arteries.   Right ABI: Resting right ankle-brachial index is within normal range. The  right toe-brachial index is abnormal.  Left ABI: Resting left ankle-brachial index indicates moderate left lower  extremity arterial disease. The left toe-brachial index is abnormal.  Right Upper Extremity: Doppler waveforms decrease <50% with right radial  compression. Doppler waveform obliterate with right ulnar compression.  Left Upper Extremity: Doppler waveform obliterate with left radial  compression. Doppler waveform obliterate with left ulnar compression.  __________   2D echo 06/16/2022: 1. Left ventricular ejection fraction, by estimation, is 40%. The left  ventricle has mild to moderately decreased function. The left ventricle  demonstrates regional wall motion abnormalities with mid to apical  anteroseptal  and inferoseptal akinesis and  anterior hypokinesis. The left ventricular internal cavity size was mildly  dilated. Left ventricular diastolic parameters were normal.   2. Right ventricular systolic function is normal. The right ventricular  size is normal. Tricuspid regurgitation signal is inadequate for assessing  PA pressure.   3. The mitral valve is normal in structure. No evidence of mitral valve  regurgitation. No evidence of mitral stenosis.   4. The aortic valve is probaby tricuspid though not visualized ideally.  There is mild calcification of the aortic valve. Aortic valve  regurgitation is moderate to severe. There was no holodiastolic flow  reversal noted in the descending thoracic aorta.  No aortic stenosis is present.   5. The inferior vena cava is normal in size with <50% respiratory  variability, suggesting right atrial pressure of 8 mmHg.  __________   LHC 06/15/2022: Conclusions: Severe complex ostial/proximal LAD involving moderate-caliber D1 that is difficult to visualize due to vessel overlap.  Lesion appears to be an acute plaque rupture with up to 90%  stenosis involving the LAD and 75% stenosis involving D1 leading to the patient's NSTEMI.  LMCA, ramus intermedius, LCx, and RCA are without significant disease. Mildly reduced left ventricular systolic function with mild mid and apical anterior and apical inferior hypokinesis (LVEF 45-50%). Normal left ventricular filling pressure (LVEDP 6 mmHg).   Recommendations: Case reviewed with Dr. Herbie Baltimore (interventional cardiology) and Dr. Cliffton Asters (cardiothoracic surgery).  Given proximal LAD disease involving sizeable D1 and adjacent ramus intermedius branch, PCI would be high risk.  In the setting of his DM and LAD/D1 disease, we have agreed that CABG may be the best revascularization option.  Given that Mr. Pipitone is currently chest pain free and hemodynamically stable, we will transfer him to [facility] for urgent evaluation and possible CABG as soon as tomorrow. Start IV heparin 2 hours after TR band deflation. High-intensity statin therapy. Aspirin 81 mg daily; defer P2Y12 inhibitor pending cardiac surgery evaluation. Start metoprolol tartrate 12.5 mg twice daily.   EKG:  EKG is not ordered today.    Recent Labs: 06/16/2022: TSH 3.113 06/27/2022: ALT 44 06/29/2022: Magnesium 1.8 07/01/2022: BUN 13; Creatinine, Ser 1.13; Potassium 3.8; Sodium 134 07/03/2022: Hemoglobin 7.1; Platelets 325  Recent Lipid Panel    Component Value Date/Time   CHOL 98 06/15/2022 1553   TRIG 58 06/15/2022 1553   HDL 48 06/15/2022 1553   CHOLHDL 2.0 06/15/2022 1553   VLDL 12 06/15/2022 1553   LDLCALC 38 06/15/2022 1553   LDLCALC 30 12/09/2021 1055    PHYSICAL EXAM:    VS:  BP 128/75 (BP Location: Left Arm, Patient Position: Sitting, Cuff Size: Normal)   Pulse 77   Ht 6' (1.829 m)   Wt 135 lb (61.2 kg)   SpO2 98%   BMI 18.31 kg/m   BMI: Body mass index is 18.31 kg/m.  Physical Exam Vitals reviewed.  Constitutional:      Appearance: He is well-developed.  HENT:     Head: Normocephalic and atraumatic.   Eyes:     General:        Right eye: No discharge.        Left eye: No discharge.  Neck:     Vascular: No JVD.  Cardiovascular:     Rate and Rhythm: Normal rate and regular rhythm.     Pulses:          Posterior tibial pulses are 2+ on the right side and 2+ on the left side.  Heart sounds: Normal heart sounds, S1 normal and S2 normal. Heart sounds not distant. No midsystolic click and no opening snap. No murmur heard.    No friction rub.     Comments: Mechanical click noted in the right upper sternal border. Pulmonary:     Effort: Pulmonary effort is normal. No respiratory distress.     Breath sounds: Normal breath sounds. No decreased breath sounds, wheezing or rales.  Chest:     Chest wall: No tenderness.  Abdominal:     General: There is no distension.  Musculoskeletal:     Cervical back: Normal range of motion.     Right lower leg: No edema.     Left lower leg: No edema.  Skin:    General: Skin is warm and dry.     Nails: There is no clubbing.  Neurological:     Mental Status: He is alert and oriented to person, place, and time.  Psychiatric:        Speech: Speech normal.        Behavior: Behavior normal.        Thought Content: Thought content normal.        Judgment: Judgment normal.     Wt Readings from Last 3 Encounters:  11/26/22 135 lb (61.2 kg)  10/26/22 135 lb 3.2 oz (61.3 kg)  10/12/22 132 lb (59.9 kg)     ASSESSMENT & PLAN:   CAD status post CABG without angina: He is doing well and without symptoms concerning for angina or cardiac decompensation.  Continue aggressive risk factor modification and secondary prevention including aspirin and atorvastatin.  He notes a significant improvement in fatigue and functional status following transition from Lopressor to Toprol-XL.  He has declined cardiac rehab.  No indication for further ischemic testing at this time.  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  Not requiring a standing loop  diuretic.  He notes significant improvement in fatigue following transition from Lopressor to Toprol-XL 12.5 mg daily, which will be continued.  Relative hypotension has precluded escalation of GDMT.  When he is seen in follow-up, if BP remains consistent with today's readings would like to consider addition of low-dose ARB.  Look to repeat limited echo in several months time to evaluate for further improvement in LV systolic function following surgical revascularization and optimization of evidence-based medical therapy.  CHF education.   Moderate to severe aortic regurgitation status post mechanical AVR: Remains on warfarin, followed by Coumadin clinic.  Continue aspirin as outlined above along with Protonix.  SBE prophylaxis indicated for all dental procedures.  Normal functioning aortic valve prosthesis on echo in 07/2022.  HTN: Blood pressure is well-controlled in the office today.  Remains on Toprol-XL as outlined above.  Tobacco use: Continues to smoke approximately three quarters to half pack daily.  Complete cessation is encouraged.  Declines assistance at this time.  Postoperative anemia: Previously recommended CBCs remain pending dating back to 06/2022.  Obtain CBC today.  Denies symptoms concerning for bleeding.    Disposition: F/u with Dr. Okey Dupre or an APP in 3 months.   Medication Adjustments/Labs and Tests Ordered: Current medicines are reviewed at length with the patient today.  Concerns regarding medicines are outlined above. Medication changes, Labs and Tests ordered today are summarized above and listed in the Patient Instructions accessible in Encounters.   Signed, Eula Listen, PA-C 11/26/2022 12:39 PM     [facility] [place] [address] [place], [place] [address] [phone] 

## 2022-11-24 ENCOUNTER — Ambulatory Visit: Payer: Medicare Other

## 2022-11-24 DIAGNOSIS — Z5181 Encounter for therapeutic drug level monitoring: Secondary | ICD-10-CM

## 2022-11-24 DIAGNOSIS — Z952 Presence of prosthetic heart valve: Secondary | ICD-10-CM | POA: Diagnosis not present

## 2022-11-24 LAB — POCT INR: INR: 1.7 — AB (ref 2.0–3.0)

## 2022-11-24 NOTE — Patient Instructions (Signed)
TAKE 2.5 TABLETS TODAY ONLY THEN Continue 1 tablet daily, except 1.5 tablets every Wednesday Recheck in 6  weeks.  Call with new medications or bleeding problems Coumadin Clinic [phone] or [place] office [phone].

## 2022-11-26 ENCOUNTER — Encounter: Payer: Self-pay | Admitting: Physician Assistant

## 2022-11-26 ENCOUNTER — Ambulatory Visit: Payer: Medicare Other | Attending: Physician Assistant | Admitting: Physician Assistant

## 2022-11-26 VITALS — BP 128/75 | HR 77 | Ht 72.0 in | Wt 135.0 lb

## 2022-11-26 DIAGNOSIS — I351 Nonrheumatic aortic (valve) insufficiency: Secondary | ICD-10-CM

## 2022-11-26 DIAGNOSIS — I255 Ischemic cardiomyopathy: Secondary | ICD-10-CM

## 2022-11-26 DIAGNOSIS — I10 Essential (primary) hypertension: Secondary | ICD-10-CM

## 2022-11-26 DIAGNOSIS — Z720 Tobacco use: Secondary | ICD-10-CM | POA: Diagnosis not present

## 2022-11-26 DIAGNOSIS — D649 Anemia, unspecified: Secondary | ICD-10-CM

## 2022-11-26 DIAGNOSIS — I2510 Atherosclerotic heart disease of native coronary artery without angina pectoris: Secondary | ICD-10-CM

## 2022-11-26 DIAGNOSIS — Z951 Presence of aortocoronary bypass graft: Secondary | ICD-10-CM

## 2022-11-26 DIAGNOSIS — Z952 Presence of prosthetic heart valve: Secondary | ICD-10-CM

## 2022-11-26 LAB — CBC
Hematocrit: 37.2 % — ABNORMAL LOW (ref 37.5–51.0)
Hemoglobin: 12.1 g/dL — ABNORMAL LOW (ref 13.0–17.7)
MCH: 30.3 pg (ref 26.6–33.0)
MCHC: 32.5 g/dL (ref 31.5–35.7)
MCV: 93 fL (ref 79–97)
Platelets: 340 10*3/uL (ref 150–450)
RBC: 4 x10E6/uL — ABNORMAL LOW (ref 4.14–5.80)
RDW: 14.9 % (ref 11.6–15.4)
WBC: 8.8 10*3/uL (ref 3.4–10.8)

## 2022-11-26 NOTE — Patient Instructions (Signed)
Medication Instructions:  Your Physician recommend you continue on your current medication as directed.    *If you need a refill on your cardiac medications before your next appointment, please call your pharmacy*   Lab Work: Your provider would like for you to have following labs drawn today CBC.   If you have labs (blood work) drawn today and your tests are completely normal, you will receive your results only by: MyChart Message (if you have MyChart) OR A paper copy in the mail If you have any lab test that is abnormal or we need to change your treatment, we will call you to review the results.   Testing/Procedures: None   Follow-Up: At [facility], you and your health needs are our priority.  As part of our continuing mission to provide you with exceptional heart care, we have created designated Provider Care Teams.  These Care Teams include your primary Cardiologist (physician) and Advanced Practice Providers (APPs -  Physician Assistants and Nurse Practitioners) who all work together to provide you with the care you need, when you need it.  We recommend signing up for the patient portal called "MyChart".  Sign up information is provided on this After Visit Summary.  MyChart is used to connect with patients for Virtual Visits (Telemedicine).  Patients are able to view lab/test results, encounter notes, upcoming appointments, etc.  Non-urgent messages can be sent to your provider as well.   To learn more about what you can do with MyChart, go to [email or web address].    Your next appointment:   3 month(s)  Provider:   You may see Yvonne Kendall, MD or one of the following Advanced Practice Providers on your designated Care Team:   Nicolasa Ducking, NP Eula Listen, PA-C Cadence Fransico Michael, PA-C Charlsie Quest, NP

## 2022-11-30 ENCOUNTER — Telehealth: Payer: Self-pay | Admitting: Internal Medicine

## 2022-11-30 NOTE — Telephone Encounter (Signed)
LM 11/30/2022 to schedule AWV   Verlee Rossetti; Care Guide Ambulatory Clinical Support [facility] Direct Dial: [phone]

## 2022-12-03 DIAGNOSIS — R52 Pain, unspecified: Secondary | ICD-10-CM | POA: Diagnosis not present

## 2022-12-03 DIAGNOSIS — Z79899 Other long term (current) drug therapy: Secondary | ICD-10-CM | POA: Diagnosis not present

## 2022-12-04 DIAGNOSIS — Z79899 Other long term (current) drug therapy: Secondary | ICD-10-CM | POA: Diagnosis not present

## 2022-12-04 DIAGNOSIS — Z720 Tobacco use: Secondary | ICD-10-CM | POA: Diagnosis not present

## 2022-12-04 DIAGNOSIS — R52 Pain, unspecified: Secondary | ICD-10-CM | POA: Diagnosis not present

## 2022-12-04 DIAGNOSIS — K861 Other chronic pancreatitis: Secondary | ICD-10-CM | POA: Diagnosis not present

## 2022-12-06 ENCOUNTER — Other Ambulatory Visit: Payer: Self-pay | Admitting: Cardiology

## 2022-12-06 DIAGNOSIS — Z5181 Encounter for therapeutic drug level monitoring: Secondary | ICD-10-CM

## 2022-12-06 DIAGNOSIS — Z952 Presence of prosthetic heart valve: Secondary | ICD-10-CM

## 2022-12-06 DIAGNOSIS — Z7901 Long term (current) use of anticoagulants: Secondary | ICD-10-CM

## 2022-12-06 NOTE — Telephone Encounter (Signed)
Please review

## 2022-12-12 IMAGING — CT CT ABD-PELV W/ CM
2 of 5 series · 15 of 46 positions shown, 17 images · IV contrast (APPLIED)
Comparison: Chest radiograph of earlier today.

CLINICAL DATA: Weakness and vomiting. Prior gallbladder stent.
History of diabetic ketoacidosis.

EXAM:
CT ABDOMEN AND PELVIS WITH CONTRAST
TECHNIQUE: Multidetector CT imaging of the abdomen and pelvis was performed
using the standard protocol following bolus administration of
intravenous contrast.
CONTRAST:  100mL OMNIPAQUE IOHEXOL 300 MG/ML  SOLN

[Series 2: routine abd/pel with · axial · 0.77mm/px · z∈[-1293,-878]mm · 12 of 95 slices shown, 14 images]
[im 6/95  soft-tissue]
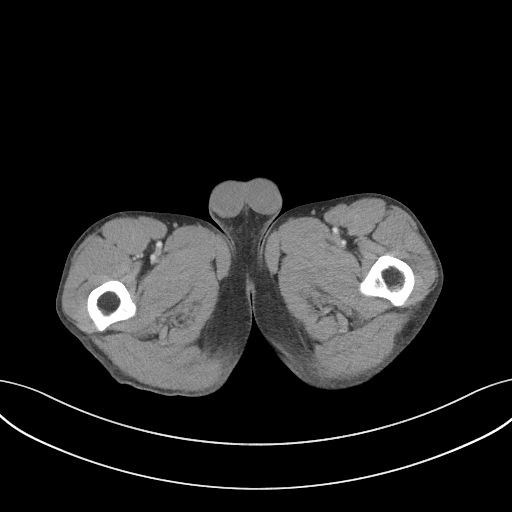
[im 6/95  bone]
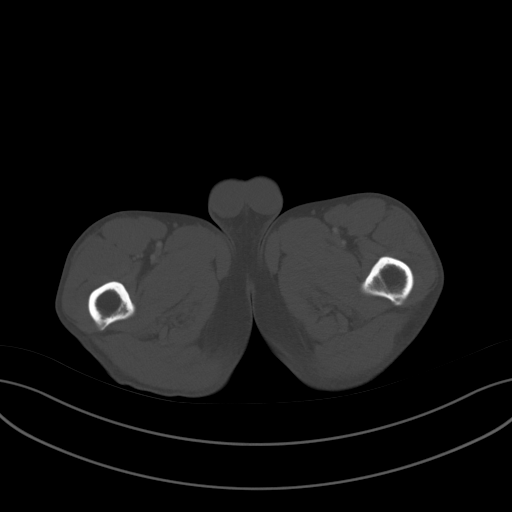
[im 16/95  soft-tissue]
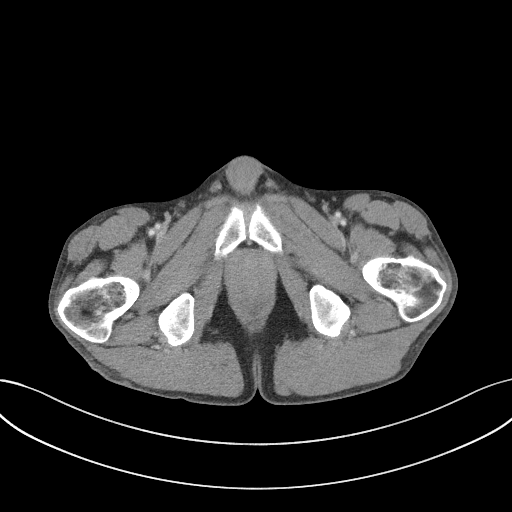
[im 21/95  soft-tissue]
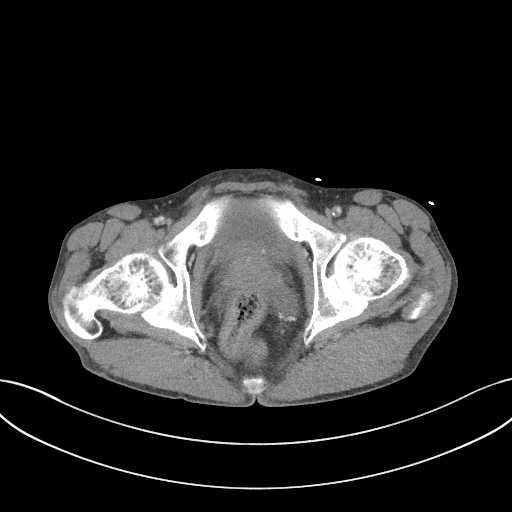
[im 27/95  soft-tissue]
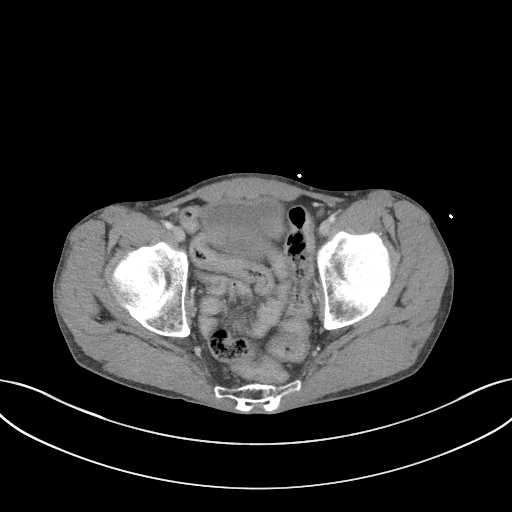
[im 37/95  soft-tissue]
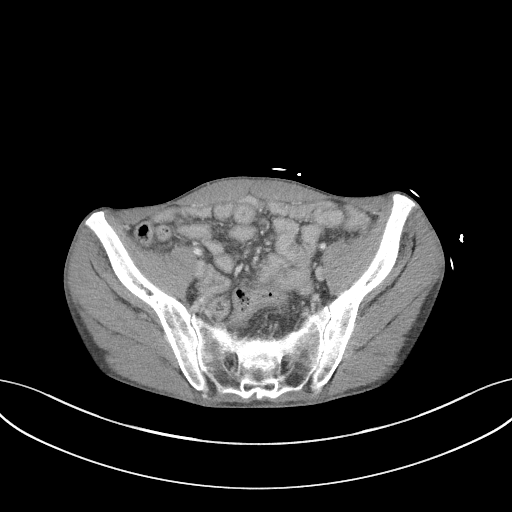
[im 42/95  soft-tissue]
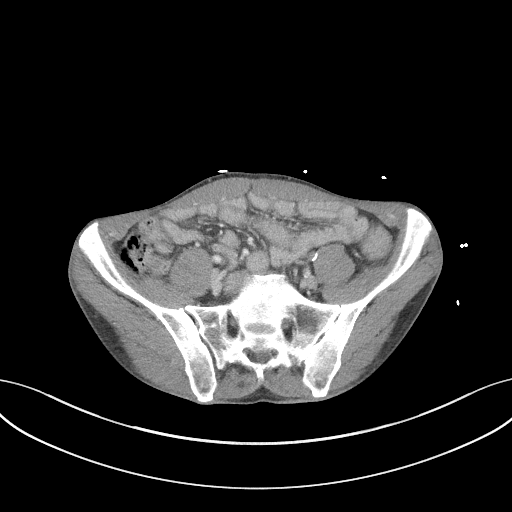
[im 53/95  soft-tissue]
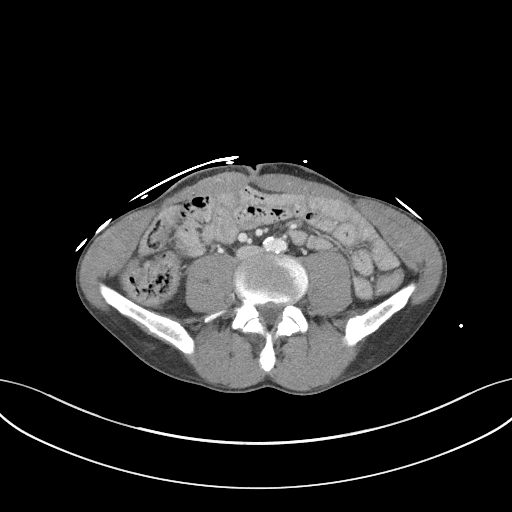
[im 58/95  soft-tissue]
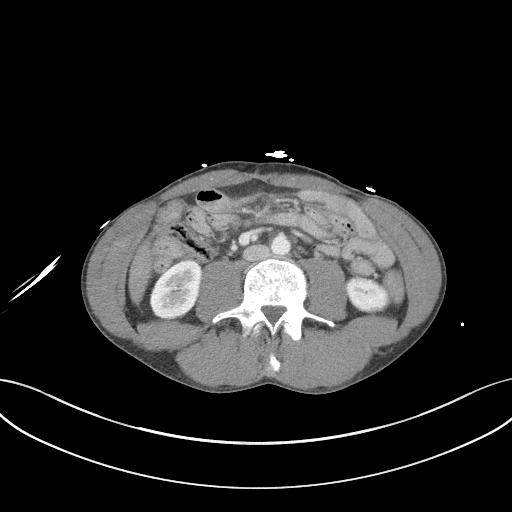
[im 68/95  soft-tissue]
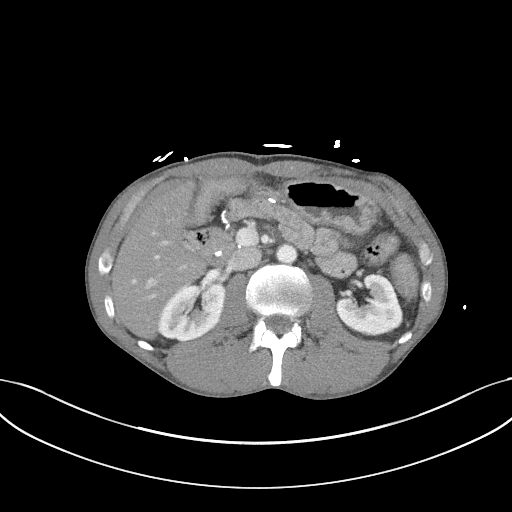
[im 68/95  bone]
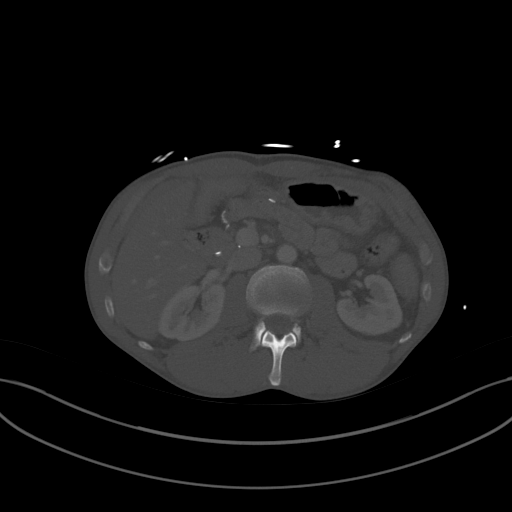
[im 74/95  soft-tissue]
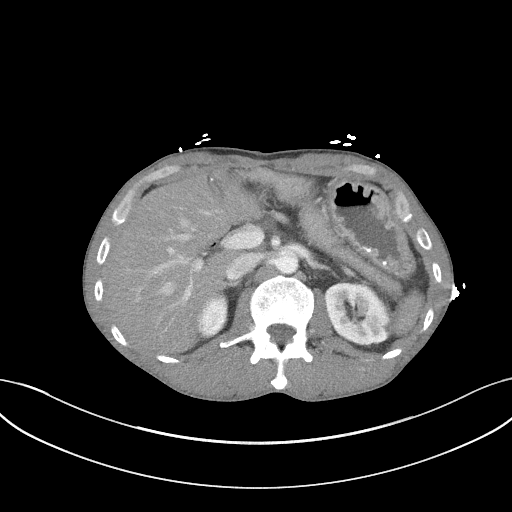
[im 79/95  soft-tissue]
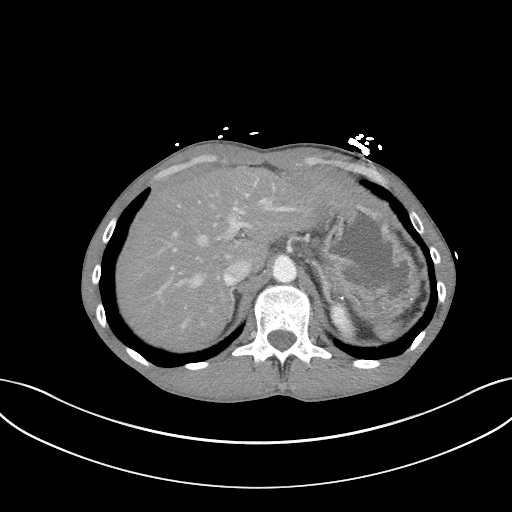
[im 89/95  soft-tissue]
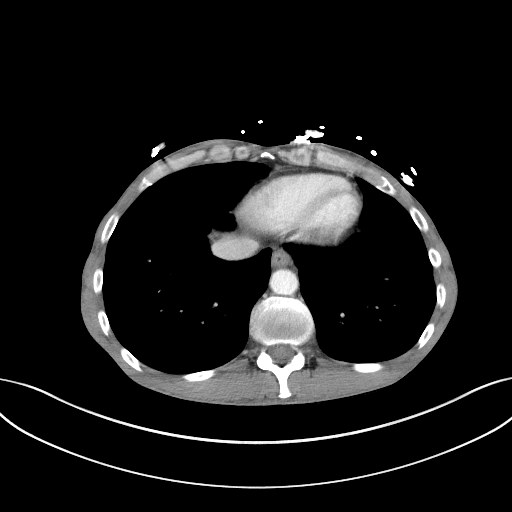

[Series 4: coronal st · coronal · 0.67mm/px · 3 of 74 slices shown]
[im 25/74  soft-tissue]
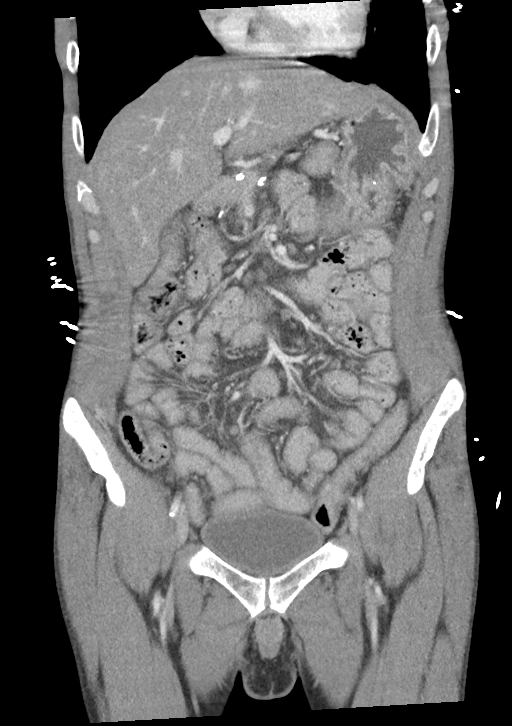
[im 33/74  soft-tissue]
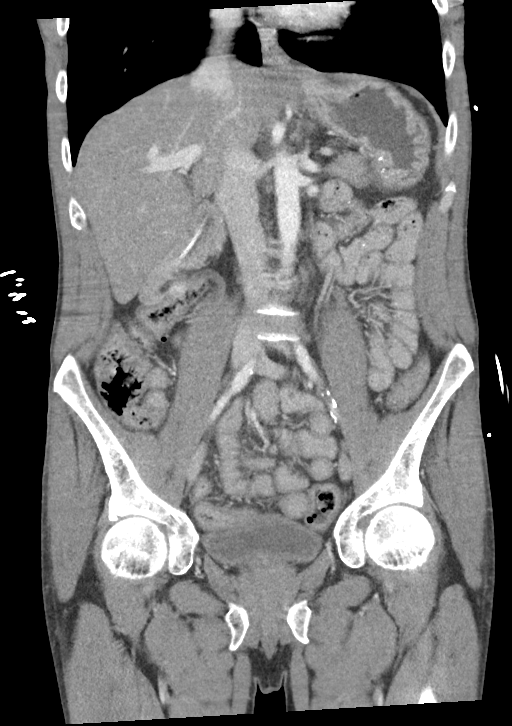
[im 41/74  soft-tissue]
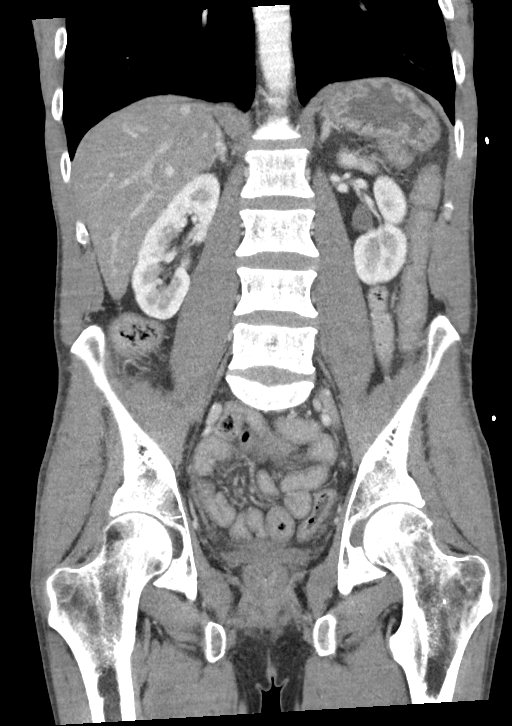

[15 of 46 positions shown; findings below may reference images not displayed]

FINDINGS: Lower chest: Clear lung bases. Normal heart size without pericardial
or pleural effusion.

Hepatobiliary: Hepatic steatosis, without focal liver lesion.
Cholecystectomy.

Pancreas: The pancreatic body and tail are either markedly atrophic
or surgically absent. There may be minimal residual pancreatic head
parenchyma.

Spleen: Surgically absent

Adrenals/Urinary Tract: Normal adrenal glands. Normal kidneys,
without hydronephrosis. Normal urinary bladder.

Stomach/Bowel: Suspect mild gastric wall and fold thickening,
including on [DATE] within the body. Suspect gastro jejunostomy.

A catheter/stent is positioned within right upper quadrant small
bowel, including on 33/4. This may originate in the region of the
remaining pancreatic head.

Diffuse colonic underdistention.  No small bowel obstruction.

Vascular/Lymphatic: Aortic atherosclerosis. The celiac is diminutive
including on [DATE]. The SMA is widely patent, as is the portal vein.
No abdominopelvic adenopathy.

Reproductive: Normal prostate.

Other: No significant free fluid.  No free intraperitoneal air.

Musculoskeletal: No acute osseous abnormality.  Disc bulge at L4-5.
IMPRESSION: 1. Gastric body wall and fold thickening, suspicious for gastritis.
No other acute process identified.
2. Extensive surgical changes, without pertinent clinical history.
Suspect at least partial pancreatectomy and gastrojejunostomy
(possibly Whipple procedure). A catheter or stent within right
abdominal small bowel may originate within the remaining pancreatic
head, but is of indeterminate function/position. Question pancreatic
duct stent which has passed into the jejunum.
3. Aortic Atherosclerosis ([id number]). This is age advanced.
Moderate narrowing of the celiac with widely patent SMA.
4. Hepatic steatosis

## 2022-12-14 ENCOUNTER — Ambulatory Visit (INDEPENDENT_AMBULATORY_CARE_PROVIDER_SITE_OTHER): Payer: Medicare Other | Admitting: Internal Medicine

## 2022-12-14 ENCOUNTER — Encounter: Payer: Self-pay | Admitting: Internal Medicine

## 2022-12-14 VITALS — BP 118/66 | HR 77 | Temp 96.6°F | Ht 72.0 in | Wt 136.0 lb

## 2022-12-14 DIAGNOSIS — Z125 Encounter for screening for malignant neoplasm of prostate: Secondary | ICD-10-CM

## 2022-12-14 DIAGNOSIS — R636 Underweight: Secondary | ICD-10-CM

## 2022-12-14 DIAGNOSIS — E1165 Type 2 diabetes mellitus with hyperglycemia: Secondary | ICD-10-CM

## 2022-12-14 DIAGNOSIS — Z794 Long term (current) use of insulin: Secondary | ICD-10-CM | POA: Diagnosis not present

## 2022-12-14 DIAGNOSIS — Z0001 Encounter for general adult medical examination with abnormal findings: Secondary | ICD-10-CM

## 2022-12-14 DIAGNOSIS — Z1211 Encounter for screening for malignant neoplasm of colon: Secondary | ICD-10-CM

## 2022-12-14 MED ORDER — CREON 24000-76000 UNITS PO CPEP: 3.0000 | ORAL_CAPSULE | Freq: Three times a day (TID) | ORAL | 1 refills | Status: DC

## 2022-12-14 NOTE — Patient Instructions (Signed)
Health Maintenance, Male Adopting a healthy lifestyle and getting preventive care are important in promoting health and wellness. Ask your health care provider about: The right schedule for you to have regular tests and exams. Things you can do on your own to prevent diseases and keep yourself healthy. What should I know about diet, weight, and exercise? Eat a healthy diet  Eat a diet that includes plenty of vegetables, fruits, low-fat dairy products, and lean protein. Do not eat a lot of foods that are high in solid fats, added sugars, or sodium. Maintain a healthy weight Body mass index (BMI) is a measurement that can be used to identify possible weight problems. It estimates body fat based on height and weight. Your health care provider can help determine your BMI and help you achieve or maintain a healthy weight. Get regular exercise Get regular exercise. This is one of the most important things you can do for your health. Most adults should: Exercise for at least 150 minutes each week. The exercise should increase your heart rate and make you sweat (moderate-intensity exercise). Do strengthening exercises at least twice a week. This is in addition to the moderate-intensity exercise. Spend less time sitting. Even light physical activity can be beneficial. Watch cholesterol and blood lipids Have your blood tested for lipids and cholesterol at 49 years of age, then have this test every 5 years. You may need to have your cholesterol levels checked more often if: Your lipid or cholesterol levels are high. You are older than 49 years of age. You are at high risk for heart disease. What should I know about cancer screening? Many types of cancers can be detected early and may often be prevented. Depending on your health history and family history, you may need to have cancer screening at various ages. This may include screening for: Colorectal cancer. Prostate cancer. Skin cancer. Lung  cancer. What should I know about heart disease, diabetes, and high blood pressure? Blood pressure and heart disease High blood pressure causes heart disease and increases the risk of stroke. This is more likely to develop in people who have high blood pressure readings or are overweight. Talk with your health care provider about your target blood pressure readings. Have your blood pressure checked: Every 3-5 years if you are 18-39 years of age. Every year if you are 40 years old or older. If you are between the ages of 65 and 75 and are a current or former smoker, ask your health care provider if you should have a one-time screening for abdominal aortic aneurysm (AAA). Diabetes Have regular diabetes screenings. This checks your fasting blood sugar level. Have the screening done: Once every three years after age 45 if you are at a normal weight and have a low risk for diabetes. More often and at a younger age if you are overweight or have a high risk for diabetes. What should I know about preventing infection? Hepatitis B If you have a higher risk for hepatitis B, you should be screened for this virus. Talk with your health care provider to find out if you are at risk for hepatitis B infection. Hepatitis C Blood testing is recommended for: Everyone born from 1945 through 1965. Anyone with known risk factors for hepatitis C. Sexually transmitted infections (STIs) You should be screened each year for STIs, including gonorrhea and chlamydia, if: You are sexually active and are younger than 49 years of age. You are older than 49 years of age and your   health care provider tells you that you are at risk for this type of infection. Your sexual activity has changed since you were last screened, and you are at increased risk for chlamydia or gonorrhea. Ask your health care provider if you are at risk. Ask your health care provider about whether you are at high risk for HIV. Your health care provider  may recommend a prescription medicine to help prevent HIV infection. If you choose to take medicine to prevent HIV, you should first get tested for HIV. You should then be tested every 3 months for as long as you are taking the medicine. Follow these instructions at home: Alcohol use Do not drink alcohol if your health care provider tells you not to drink. If you drink alcohol: Limit how much you have to 0-2 drinks a day. Know how much alcohol is in your drink. In the [place] one drink equals one 12 oz bottle of beer (355 mL), one 5 oz glass of wine (148 mL), or one 1 oz glass of hard liquor (44 mL). Lifestyle Do not use any products that contain nicotine or tobacco. These products include cigarettes, chewing tobacco, and vaping devices, such as e-cigarettes. If you need help quitting, ask your health care provider. Do not use street drugs. Do not share needles. Ask your health care provider for help if you need support or information about quitting drugs. General instructions Schedule regular health, dental, and eye exams. Stay current with your vaccines. Tell your health care provider if: You often feel depressed. You have ever been abused or do not feel safe at home. Summary Adopting a healthy lifestyle and getting preventive care are important in promoting health and wellness. Follow your health care provider's instructions about healthy diet, exercising, and getting tested or screened for diseases. Follow your health care provider's instructions on monitoring your cholesterol and blood pressure. This information is not intended to replace advice given to you by your health care provider. Make sure you discuss any questions you have with your health care provider. Document Revised: 08/04/2020 Document Reviewed: 08/04/2020 Elsevier Patient Education  2024 Else[employer] Inc.  

## 2022-12-14 NOTE — Progress Notes (Signed)
Subjective:    Patient ID: Ian Gross, male    DOB: 1975-10-03, 47 y.o.   MRN: [id number]  HPI  Patient presents to clinic today for his annual exam.  Flu: Tetanus: 04/2019 COVID: never Pneumovax: 04/2019 PSA screening: Never Colon screening: Never Vision screening: annually Dentist: annually  Diet: He does eat meat. He consumes fruits and veggies. He does eat some fried foods. He drinks mostly water, juice. Exercise: None  Review of Systems     Past Medical History:  Diagnosis Date   Alcoholic pancreatitis    s/p pancreatectomy   Diabetes mellitus with complication (HCC)    H/O splenectomy     Current Outpatient Medications  Medication Sig Dispense Refill   aspirin EC 81 MG tablet Take 1 tablet (81 mg total) by mouth daily. Swallow whole.     atorvastatin (LIPITOR) 80 MG tablet TAKE 1 TABLET BY MOUTH EVERY DAY 90 tablet 0   Buprenorphine HCl-Naloxone HCl 8-2 MG FILM Place 1 Film under the tongue 2 (two) times daily.     gabapentin (NEURONTIN) 300 MG capsule Take 1 capsule (300 mg total) by mouth 3 (three) times daily. 90 capsule 0   HUMALOG KWIKPEN 100 UNIT/ML KwikPen Inject 4-9 Units into the skin 3 (three) times daily. Sliding scale (go up by 1 unit)     insulin glargine (LANTUS) 100 UNIT/ML injection Inject 10 Units into the skin at bedtime.     methocarbamol (ROBAXIN) 500 MG tablet Take 1 tablet (500 mg total) by mouth every 8 (eight) hours as needed for muscle spasms. 90 tablet 0   metoprolol succinate (TOPROL XL) 25 MG 24 hr tablet Take 0.5 tablets (12.5 mg total) by mouth daily. 45 tablet 3   Pancrelipase, Lip-Prot-Amyl, (CREON) 24000-76000 units CPEP TAKE 3 CAPSULES (72,000 UNITS TOTAL) BY MOUTH 3 (THREE) TIMES DAILY. 810 capsule 1   pantoprazole (PROTONIX) 40 MG tablet Take 1 tablet (40 mg total) by mouth daily. 30 tablet 11   warfarin (COUMADIN) 2.5 MG tablet TAKE 1 TO 2 TABLETS BY MOUTH DAILY OR AS DIRECTED BY COUMADIN CLINIC 100 tablet 0   No current  facility-administered medications for this visit.    No Known Allergies  Family History  Problem Relation Age of Onset   Lung cancer Father    Healthy Sister    Healthy Brother    CVA Maternal Grandmother    Liver cancer Maternal Grandfather    Alcohol abuse Maternal Grandfather     Social History   Socioeconomic History   Marital status: Single    Spouse name: Not on file   Number of children: 2   Years of education: Not on file   Highest education level: Not on file  Occupational History   Occupation: Disable  Tobacco Use   Smoking status: Some Days    Current packs/day: 0.75    Types: Cigarettes   Smokeless tobacco: Never  Vaping Use   Vaping status: Never Used  Substance and Sexual Activity   Alcohol use: Not Currently   Drug use: Not Currently   Sexual activity: Not on file  Other Topics Concern   Not on file  Social History Narrative   Not on file   Social Determinants of Health   Financial Resource Strain: Low Risk  (09/03/2021)   Overall Financial Resource Strain (CARDIA)    Difficulty of Paying Living Expenses: Not hard at all  Food Insecurity: No Food Insecurity (07/07/2022)   Hunger Vital Sign    Worried  About Running Out of Food in the Last Year: Never true    Ran Out of Food in the Last Year: Never true  Transportation Needs: No Transportation Needs (07/07/2022)   PRAPARE - Administrator, Civil Service (Medical): No    Lack of Transportation (Non-Medical): No  Physical Activity: Not on file  Stress: No Stress Concern Present (09/03/2021)   [employer] of Occupational Health - Occupational Stress Questionnaire    Feeling of Stress : Only a little  Social Connections: Moderately Isolated (09/03/2021)   Social Connection and Isolation Panel [NHANES]    Frequency of Communication with Friends and Family: Twice a week    Frequency of Social Gatherings with Friends and Family: More than three times a week    Attends Religious Services:  Never    Database administrator or Organizations: No    Attends Engineer, structural: Not on file    Marital Status: Living with partner  Intimate Partner Violence: Not At Risk (09/03/2021)   Humiliation, Afraid, Rape, and Kick questionnaire    Fear of Current or Ex-Partner: No    Emotionally Abused: No    Physically Abused: No    Sexually Abused: No     Constitutional: Denies fever, malaise, fatigue, headache or abrupt weight changes.  HEENT: Denies eye pain, eye redness, ear pain, ringing in the ears, wax buildup, runny nose, nasal congestion, bloody nose, or sore throat. Respiratory: Denies difficulty breathing, shortness of breath, cough or sputum production.   Cardiovascular: Denies chest pain, chest tightness, palpitations or swelling in the hands or feet.  Gastrointestinal: Denies abdominal pain, bloating, constipation, diarrhea or blood in the stool.  GU: Denies urgency, frequency, pain with urination, burning sensation, blood in urine, odor or discharge. Musculoskeletal: Patient reports chronic joint pain.  Denies decrease in range of motion, difficulty with gait, muscle pain or joint swelling.  Skin: Denies redness, rashes, lesions or ulcercations.  Neurological: Denies dizziness, difficulty with memory, difficulty with speech or problems with balance and coordination.  Psych: Denies anxiety, depression, SI/HI.  No other specific complaints in a complete review of systems (except as listed in HPI above).  Objective:   Physical Exam  BP 118/66 (BP Location: Left Arm, Patient Position: Sitting, Cuff Size: Normal)   Pulse 77   Temp (!) 96.6 F (35.9 C) (Temporal)   Ht 6' (1.829 m)   Wt 136 lb (61.7 kg)   SpO2 97%   BMI 18.44 kg/m   Wt Readings from Last 3 Encounters:  11/26/22 135 lb (61.2 kg)  10/26/22 135 lb 3.2 oz (61.3 kg)  10/12/22 132 lb (59.9 kg)    General: Appears his stated age, underweight, in NAD. Skin: Warm, dry and intact. No ulcerations  noted. HEENT: Head: normal shape and size; Eyes: sclera white, no icterus, conjunctiva pink, PERRLA and EOMs intact;  Neck:  Neck supple, trachea midline. No masses, lumps or thyromegaly present.  Cardiovascular: Normal rate and rhythm. S1,S2 noted.  No murmur, rubs or gallops noted. No JVD or BLE edema.  Pulmonary/Chest: Normal effort and positive vesicular breath sounds. No respiratory distress. No wheezes, rales or ronchi noted.  Abdomen: Soft and nontender. Normal bowel sounds.  Musculoskeletal: Strength 5/5 BUE/BLE.  No difficulty with gait.  Neurological: Alert and oriented. Cranial nerves II-XII grossly intact. Coordination normal.  Psychiatric: Mood and affect normal. Behavior is normal. Judgment and thought content normal.     BMET    Component Value Date/Time   NA  134 (L) 07/01/2022 0045   K 3.8 07/01/2022 0045   CL 99 07/01/2022 0045   CO2 30 07/01/2022 0045   GLUCOSE 177 (H) 07/01/2022 0045   BUN 13 07/01/2022 0045   CREATININE 1.13 07/01/2022 0045   CREATININE 0.96 12/09/2021 1055   CALCIUM 8.3 (L) 07/01/2022 0045   GFRNONAA >60 07/01/2022 0045   GFRAA >60 08/01/2019 0509    Lipid Panel     Component Value Date/Time   CHOL 98 06/15/2022 1553   TRIG 58 06/15/2022 1553   HDL 48 06/15/2022 1553   CHOLHDL 2.0 06/15/2022 1553   VLDL 12 06/15/2022 1553   LDLCALC 38 06/15/2022 1553   LDLCALC 30 12/09/2021 1055    CBC    Component Value Date/Time   WBC 8.8 11/26/2022 1130   WBC 16.1 (H) 07/03/2022 0101   RBC 4.00 (L) 11/26/2022 1130   RBC 1.87 (L) 07/03/2022 0101   HGB 12.1 (L) 11/26/2022 1130   HCT 37.2 (L) 11/26/2022 1130   PLT 340 11/26/2022 1130   MCV 93 11/26/2022 1130   MCH 30.3 11/26/2022 1130   MCH 33.2 07/03/2022 0101   MCHC 32.5 11/26/2022 1130   MCHC 34.4 07/03/2022 0101   RDW 14.9 11/26/2022 1130   LYMPHSABS 3.4 06/15/2022 1553   MONOABS 1.3 (H) 06/15/2022 1553   EOSABS 0.3 06/15/2022 1553   BASOSABS 0.1 06/15/2022 1553    Hgb A1C Lab  Results  Component Value Date   HGBA1C 7.9 (H) 06/27/2022            Assessment & Plan:   Preventative health maintenance:  He declines flu shot Tetanus UTD Encouraged him to get his COVID-vaccine Pneumovax UTD Referral to GI for screening colonoscopy Encouraged him to consume a balanced diet and exercise regimen Advised him to see an eye doctor and dentist annually We will check CBC, c-Met, lipid, A1c, urine microalbumin and PSA  RTC in 6 months, follow-up chronic conditions Nicki Reaper, NP

## 2022-12-14 NOTE — Assessment & Plan Note (Signed)
Encourage high protein, high calorie diet

## 2022-12-15 LAB — CBC
HCT: 39.7 % (ref 38.5–50.0)
Hemoglobin: 13.2 g/dL (ref 13.2–17.1)
MCH: 31.3 pg (ref 27.0–33.0)
MCHC: 33.2 g/dL (ref 32.0–36.0)
MCV: 94.1 fL (ref 80.0–100.0)
MPV: 12.8 fL — ABNORMAL HIGH (ref 7.5–12.5)
Platelets: 303 10*3/uL (ref 140–400)
RBC: 4.22 10*6/uL (ref 4.20–5.80)
RDW: 14 % (ref 11.0–15.0)
WBC: 11.2 10*3/uL — ABNORMAL HIGH (ref 3.8–10.8)

## 2022-12-27 ENCOUNTER — Encounter: Payer: Self-pay | Admitting: *Deleted

## 2023-01-05 ENCOUNTER — Ambulatory Visit: Payer: Medicare Other | Attending: Internal Medicine

## 2023-01-05 DIAGNOSIS — Z952 Presence of prosthetic heart valve: Secondary | ICD-10-CM | POA: Diagnosis not present

## 2023-01-05 DIAGNOSIS — Z5181 Encounter for therapeutic drug level monitoring: Secondary | ICD-10-CM | POA: Diagnosis not present

## 2023-01-05 LAB — POCT INR: INR: 1.8 — AB (ref 2.0–3.0)

## 2023-01-05 NOTE — Patient Instructions (Signed)
TAKE 2.5 TABLETS TODAY ONLY THEN Continue 1 tablet daily, except 1.5 tablets every Wednesday Recheck in 6  weeks.  Call with new medications or bleeding problems Coumadin Clinic [phone] or [place] office [phone].

## 2023-01-07 DIAGNOSIS — R52 Pain, unspecified: Secondary | ICD-10-CM | POA: Diagnosis not present

## 2023-01-07 DIAGNOSIS — Z79899 Other long term (current) drug therapy: Secondary | ICD-10-CM | POA: Diagnosis not present

## 2023-01-08 DIAGNOSIS — Z720 Tobacco use: Secondary | ICD-10-CM | POA: Diagnosis not present

## 2023-01-08 DIAGNOSIS — K861 Other chronic pancreatitis: Secondary | ICD-10-CM | POA: Diagnosis not present

## 2023-01-08 DIAGNOSIS — Z79899 Other long term (current) drug therapy: Secondary | ICD-10-CM | POA: Diagnosis not present

## 2023-01-08 DIAGNOSIS — R52 Pain, unspecified: Secondary | ICD-10-CM | POA: Diagnosis not present

## 2023-01-14 ENCOUNTER — Other Ambulatory Visit: Payer: Self-pay | Admitting: Cardiology

## 2023-01-18 ENCOUNTER — Other Ambulatory Visit: Payer: Self-pay | Admitting: Cardiology

## 2023-01-18 ENCOUNTER — Other Ambulatory Visit: Payer: Self-pay | Admitting: Physician Assistant

## 2023-01-18 ENCOUNTER — Other Ambulatory Visit: Payer: Self-pay | Admitting: Internal Medicine

## 2023-01-18 DIAGNOSIS — Z952 Presence of prosthetic heart valve: Secondary | ICD-10-CM

## 2023-01-18 DIAGNOSIS — Z5181 Encounter for therapeutic drug level monitoring: Secondary | ICD-10-CM

## 2023-01-18 DIAGNOSIS — Z7901 Long term (current) use of anticoagulants: Secondary | ICD-10-CM

## 2023-01-19 NOTE — Telephone Encounter (Signed)
Warfarin 2.5mg   On[facility] AVR Last INR 01/05/23 Last OV 11/26/22

## 2023-01-24 ENCOUNTER — Encounter: Payer: Self-pay | Admitting: Internal Medicine

## 2023-01-24 ENCOUNTER — Ambulatory Visit: Payer: Self-pay

## 2023-01-24 ENCOUNTER — Ambulatory Visit (INDEPENDENT_AMBULATORY_CARE_PROVIDER_SITE_OTHER): Payer: Medicare Other | Admitting: Internal Medicine

## 2023-01-24 VITALS — BP 118/70 | HR 92 | Ht 72.0 in | Wt 129.0 lb

## 2023-01-24 DIAGNOSIS — L0232 Furuncle of buttock: Secondary | ICD-10-CM

## 2023-01-24 MED ORDER — CEPHALEXIN 500 MG PO CAPS: 500.0000 mg | ORAL_CAPSULE | Freq: Three times a day (TID) | ORAL | 0 refills | Status: DC

## 2023-01-24 NOTE — Telephone Encounter (Signed)
    Chief Complaint: Boil to buttocks. "I have had these before." Pain 7/10. No drainage. Symptoms: Above Frequency: Friday Pertinent Negatives: Patient denies fever Disposition: [] ED /[] Urgent Care (no appt availability in office) / [x] Appointment(In office/virtual)/ []  [place] [facility]/ [] Home Care/ [] Refused Recommended Disposition /[] [facility]/ []  Follow-up with PCP Additional Notes: Pt. Agrees with appointment.  Reason for Disposition  SEVERE pain (e.g., excruciating)  Answer Assessment - Initial Assessment Questions 1. APPEARANCE of BOIL: "What does the boil look like?"      Unsure 2. LOCATION: "Where is the boil located?"      Buttocks 3. NUMBER: "How many boils are there?"      1 4. SIZE: "How big is the boil?" (e.g., inches, cm; compare to size of a coin or other object)     Ping pong ball 5. ONSET: "When did the boil start?"     Friday 6. PAIN: "Is there any pain?" If Yes, ask: "How bad is the pain?"   (Scale 1-10; or mild, moderate, severe)     7 7. FEVER: "Do you have a fever?" If Yes, ask: "What is it, how was it measured, and when did it start?"      No 8. SOURCE: "Have you been around anyone with boils or other Staph infections?" "Have you ever had boils before?"     No 9. OTHER SYMPTOMS: "Do you have any other symptoms?" (e.g., shaking chills, weakness, rash elsewhere on body)     No 10. PREGNANCY: "Is there any chance you are pregnant?" "When was your last menstrual period?"       N/A  Protocols used: Boil (Skin Abscess)-A-AH

## 2023-01-24 NOTE — Patient Instructions (Signed)
Skin Abscess  A skin abscess is an infected spot of skin. It can have pus in it. An abscess can happen in any part of your body. Some abscesses break open (rupture) on their own. Most keep getting worse unless they are treated. If your abscess is not treated, the infection can spread deeper into your body and blood. This can make you feel sick. What are the causes? Germs that enter your skin. This may happen if you have: A cut or scrape. A wound from a needle or an insect bite. Blocked oil or sweat glands. A problem with the spot where your hair goes into your skin. A fluid-filled sac called a cyst under your skin. What increases the risk? Having problems with how your blood moves through your body. Having a weak body defense system (immune system). Having diabetes. Having dry and irritated skin. Needing to get shots often. Putting drugs into your body with a needle. Having a splinter or something else in your skin. Smoking. What are the signs or symptoms? A firm bump under your skin that hurts. A bump with pus at the top. Redness and swelling. Warm or tender spots. A sore on the skin. How is this treated? You may need to: Put a heat pack or a warm, wet washcloth on the spot. Have the pus drained. Take antibiotics. Follow these instructions at home: Medicines Take over-the-counter and prescription medicines only as told by your doctor. If you were prescribed antibiotics, take them as told by your doctor. Do not stop taking them even if you start to feel better. Abscess care  If you have an abscess that has not drained, put heat on it. Use the heat source that your doctor recommends, such as a moist heat pack or a heating pad. Place a towel between your skin and the heat source. Leave the heat on for 20-30 minutes. If your skin turns bright red, take off the heat right away to prevent burns. The risk of burns is higher if you cannot feel pain, heat, or cold. Follow  instructions from your doctor about how to take care of your abscess. Make sure you: Cover the abscess with a bandage. Wash your hands with soap and water for at least 20 seconds before and after you change your bandage. If you cannot use soap and water, use hand sanitizer. Change your bandage as told by your doctor. Check your abscess every day for signs that the infection is getting worse. Check for: More redness, swelling, or pain. More fluid or blood. Warmth. More pus or a worse smell. General instructions To keep the infection from spreading: Do not share personal items or towels. Do not go in a hot tub with others. Avoid making skin contact with others. Be careful when you get rid of used bandages or any pus from the abscess. Do not smoke or use any products that contain nicotine or tobacco. If you need help quitting, ask your doctor. Contact a doctor if: You see red streaks on your skin near the abscess. You have any signs of worse infection. You vomit every time you eat or drink. You have a fever, chills, or muscle aches. The cyst or abscess comes back. Get help right away if: You have very bad pain. You make less pee (urine) than normal. This information is not intended to replace advice given to you by your health care provider. Make sure you discuss any questions you have with your health care provider. Document Revised: 10/28/2021  Document Reviewed: 10/28/2021 Elsevier Patient Education  2024 [employer].

## 2023-01-24 NOTE — Progress Notes (Signed)
Subjective:    Patient ID: Ian Gross, male    DOB: January 31, 1976, 47 y.o.   MRN: [id number]  HPI  Discussed the use of AI scribe software for clinical note transcription with the patient, who gave verbal consent to proceed.  The patient, with a history of being on warfarin, presented with a recurrent boil that had previously been treated with antibiotics about a year ago. The boil, which is located on the right buttock, had flared up again on the preceding Friday. The patient reported that the boil was tender but had not yet opened or drained. He had not applied any hot compresses or taken any over-the-counter pain relievers.  He denies fever, chills, nausea, vomiting or bodyaches.      Review of Systems     Past Medical History:  Diagnosis Date   Alcoholic pancreatitis    s/p pancreatectomy   Diabetes mellitus with complication (HCC)    H/O splenectomy     Current Outpatient Medications  Medication Sig Dispense Refill   aspirin EC 81 MG tablet Take 1 tablet (81 mg total) by mouth daily. Swallow whole.     atorvastatin (LIPITOR) 80 MG tablet TAKE 1 TABLET BY MOUTH EVERY DAY 90 tablet 0   Buprenorphine HCl-Naloxone HCl 8-2 MG FILM Place 1 Film under the tongue 2 (two) times daily.     HUMALOG KWIKPEN 100 UNIT/ML KwikPen Inject 4-9 Units into the skin 3 (three) times daily. Sliding scale (go up by 1 unit)     insulin glargine (LANTUS) 100 UNIT/ML injection Inject 10 Units into the skin at bedtime.     methocarbamol (ROBAXIN) 500 MG tablet Take 1 tablet (500 mg total) by mouth every 8 (eight) hours as needed for muscle spasms. 90 tablet 0   metoprolol succinate (TOPROL XL) 25 MG 24 hr tablet Take 0.5 tablets (12.5 mg total) by mouth daily. 45 tablet 3   Pancrelipase, Lip-Prot-Amyl, (CREON) 24000-76000 units CPEP Take 3 capsules (72,000 Units total) by mouth 3 (three) times daily. 810 capsule 1   pantoprazole (PROTONIX) 40 MG tablet Take 1 tablet (40 mg total) by mouth daily. 30  tablet 11   warfarin (COUMADIN) 2.5 MG tablet TAKE 1 TO 2 TABLETS BY MOUTH DAILY OR AS DIRECTED BY COUMADIN CLINIC 100 tablet 0   No current facility-administered medications for this visit.    No Known Allergies  Family History  Problem Relation Age of Onset   Lung cancer Father    Healthy Sister    Healthy Brother    CVA Maternal Grandmother    Liver cancer Maternal Grandfather    Alcohol abuse Maternal Grandfather     Social History   Socioeconomic History   Marital status: Single    Spouse name: Not on file   Number of children: 2   Years of education: Not on file   Highest education level: Not on file  Occupational History   Occupation: Disable  Tobacco Use   Smoking status: Some Days    Current packs/day: 0.75    Types: Cigarettes   Smokeless tobacco: Never  Vaping Use   Vaping status: Never Used  Substance and Sexual Activity   Alcohol use: Not Currently   Drug use: Not Currently   Sexual activity: Not on file  Other Topics Concern   Not on file  Social History Narrative   Not on file   Social Determinants of Health   Financial Resource Strain: Low Risk  (09/03/2021)   Overall Financial Resource  Strain (CARDIA)    Difficulty of Paying Living Expenses: Not hard at all  Food Insecurity: No Food Insecurity (07/07/2022)   Hunger Vital Sign    Worried About Running Out of Food in the Last Year: Never true    Ran Out of Food in the Last Year: Never true  Transportation Needs: No Transportation Needs (07/07/2022)   PRAPARE - Administrator, Civil Service (Medical): No    Lack of Transportation (Non-Medical): No  Physical Activity: Not on file  Stress: No Stress Concern Present (09/03/2021)   [employer] of Occupational Health - Occupational Stress Questionnaire    Feeling of Stress : Only a little  Social Connections: Moderately Isolated (09/03/2021)   Social Connection and Isolation Panel [NHANES]    Frequency of Communication with Friends and  Family: Twice a week    Frequency of Social Gatherings with Friends and Family: More than three times a week    Attends Religious Services: Never    Database administrator or Organizations: No    Attends Engineer, structural: Not on file    Marital Status: Living with partner  Intimate Partner Violence: Not At Risk (09/03/2021)   Humiliation, Afraid, Rape, and Kick questionnaire    Fear of Current or Ex-Partner: No    Emotionally Abused: No    Physically Abused: No    Sexually Abused: No     Constitutional: Denies fever, malaise, fatigue, headache or abrupt weight changes.  Respiratory: Denies difficulty breathing, shortness of breath, cough or sputum production.   Cardiovascular: Denies chest pain, chest tightness, palpitations or swelling in the hands or feet.  Skin: Pt reports boil of buttocks. Denies rashes, or ulcercations.   No other specific complaints in a complete review of systems (except as listed in HPI above).  Objective:   Physical Exam  BP 118/70   Pulse 92   Ht 6' (1.829 m)   Wt 129 lb (58.5 kg)   SpO2 96%   BMI 17.50 kg/m   Wt Readings from Last 3 Encounters:  12/14/22 136 lb (61.7 kg)  11/26/22 135 lb (61.2 kg)  10/26/22 135 lb 3.2 oz (61.3 kg)    General: Appears his stated age, underweight, in NAD. Skin: 1.5 cm abscess noted to the right gluteal fold.  There is a pinpoint hole but area is not draining.  No evidence of surrounding cellulitis. Cardiovascular: Normal rate. Pulmonary/Chest: Normal effort. Neurological: Alert and oriented.   BMET    Component Value Date/Time   NA 139 12/14/2022 0934   K 4.6 12/14/2022 0934   CL 104 12/14/2022 0934   CO2 30 12/14/2022 0934   GLUCOSE 220 (H) 12/14/2022 0934   BUN 12 12/14/2022 0934   CREATININE 1.18 12/14/2022 0934   CALCIUM 8.6 12/14/2022 0934   GFRNONAA >60 07/01/2022 0045   GFRAA >60 08/01/2019 0509    Lipid Panel     Component Value Date/Time   CHOL 51 12/14/2022 0934   TRIG 39  12/14/2022 0934   HDL 30 (L) 12/14/2022 0934   CHOLHDL 1.7 12/14/2022 0934   VLDL 12 06/15/2022 1553   LDLCALC 10 12/14/2022 0934    CBC    Component Value Date/Time   WBC 11.2 (H) 12/14/2022 0934   RBC 4.22 12/14/2022 0934   HGB 13.2 12/14/2022 0934   HGB 12.1 (L) 11/26/2022 1130   HCT 39.7 12/14/2022 0934   HCT 37.2 (L) 11/26/2022 1130   PLT 303 12/14/2022 0934   PLT  340 11/26/2022 1130   MCV 94.1 12/14/2022 0934   MCV 93 11/26/2022 1130   MCH 31.3 12/14/2022 0934   MCHC 33.2 12/14/2022 0934   RDW 14.0 12/14/2022 0934   RDW 14.9 11/26/2022 1130   LYMPHSABS 3.4 06/15/2022 1553   MONOABS 1.3 (H) 06/15/2022 1553   EOSABS 0.3 06/15/2022 1553   BASOSABS 0.1 06/15/2022 1553    Hgb A1C Lab Results  Component Value Date   HGBA1C 8.9 (H) 12/14/2022           Assessment & Plan:  Assessment and Plan    Recurrent boil of right buttock Tender, not yet draining. Likely exacerbated by friction and possibly elevated blood sugar levels. Patient is on warfarin, which may limit antibiotic options. -Prescribe Keflex 500mg  three times a day for 10 days. -Advise use of Epsom salts.   RTC in 5 months follow up chronic conditions Nicki Reaper, NP

## 2023-01-26 DIAGNOSIS — E119 Type 2 diabetes mellitus without complications: Secondary | ICD-10-CM | POA: Diagnosis not present

## 2023-01-26 LAB — HM DIABETES EYE EXAM

## 2023-02-11 DIAGNOSIS — R52 Pain, unspecified: Secondary | ICD-10-CM | POA: Diagnosis not present

## 2023-02-11 DIAGNOSIS — Z79899 Other long term (current) drug therapy: Secondary | ICD-10-CM | POA: Diagnosis not present

## 2023-02-12 DIAGNOSIS — K861 Other chronic pancreatitis: Secondary | ICD-10-CM | POA: Diagnosis not present

## 2023-02-12 DIAGNOSIS — Z720 Tobacco use: Secondary | ICD-10-CM | POA: Diagnosis not present

## 2023-02-12 DIAGNOSIS — R52 Pain, unspecified: Secondary | ICD-10-CM | POA: Diagnosis not present

## 2023-02-12 DIAGNOSIS — Z79899 Other long term (current) drug therapy: Secondary | ICD-10-CM | POA: Diagnosis not present

## 2023-02-14 ENCOUNTER — Encounter: Payer: Self-pay | Admitting: Internal Medicine

## 2023-02-16 ENCOUNTER — Ambulatory Visit: Payer: Medicare Other | Attending: Internal Medicine

## 2023-02-16 DIAGNOSIS — Z952 Presence of prosthetic heart valve: Secondary | ICD-10-CM

## 2023-02-16 DIAGNOSIS — Z5181 Encounter for therapeutic drug level monitoring: Secondary | ICD-10-CM

## 2023-02-16 LAB — POCT INR: INR: 2.5 (ref 2.0–3.0)

## 2023-02-16 NOTE — Patient Instructions (Signed)
Continue 1 tablet daily, except 1.5 tablets every Wednesday Recheck in 6  weeks.  Call with new medications or bleeding problems Cou[facility]in Clinic [phone] or [place] office [phone].

## 2023-02-18 DIAGNOSIS — E119 Type 2 diabetes mellitus without complications: Secondary | ICD-10-CM | POA: Diagnosis not present

## 2023-02-21 ENCOUNTER — Encounter: Payer: Self-pay | Admitting: Internal Medicine

## 2023-02-27 NOTE — Progress Notes (Unsigned)
Cardiology Office Note    Date:  02/27/2023   ID:  Ian Gross, DOB 06/30/1975, MRN [id number]  PCP:  Lorre Munroe, NP  Cardiologist:  Yvonne Kendall, MD  Electrophysiologist:  None   Chief Complaint: Follow-up  History of Present Illness:   Ian Gross is a 47 y.o. 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   He was admitted to the hospital in 05/2022 with NSTEMI.  LHC showed severe proximal LAD disease along with moderate D1 stenosis extending back into the ostium.  The left main, ramus, LCx, and RCA were without significant disease.  PCI was felt to be too high risk with recommendation for CABG.  LVEF 45 to 50% with normal LVEDP.  Echo showed an EF of 40%, mid to apical anteroseptal and inferoseptal akinesis and anterior hypokinesis, mildly dilated LV internal cavity size, normal LV diastolic function parameters, normal RV systolic function and ventricular cavity size, moderate to severe aortic regurgitation, and an estimated right atrial pressure of 8 mmHg.  He was also noted to have poor dentition with dental consultation leading to multiple extractions prior to cardiothoracic surgery.  He underwent two-vessel CABG with LIMA to LAD and SVG to diagonal with mechanical AVR in 06/2022.  Postoperative course was notable for transfusion dependent anemia as well as hypotension and postoperative pain.  Follow-up echo on 08/09/2022 demonstrated an EF of 40 to 45%, mild LVH, severe hypokinesis of the mid to apical anteroseptal and anterior wall, normal RV systolic function and ventricular cavity size, and normal structure and function of aortic valve  prosthesis.  He was most recently seen in the office in 10/2022 and remained without symptoms of angina or cardiac decompensation.  He noted significant improvement in fatigue following transition from Lopressor to Toprol.  Continued to smoke 1/2 to 3/4 pack/day.  Relative hypotension continued to preclude escalation of GDMT.  ***   Labs independently reviewed: 02/16/2023 - INR 2.5 11/2022 - A1c 8.9, TC 51, TG 39, HDL 30, LDL 10, BUN 12, serum creatinine 1.18, potassium 4.6, albumin 3.3, AST/ALT normal, Hgb 13.2, PLT 303 05/2022 - TSH normal  Past Medical History:  Diagnosis Date   Alcoholic pancreatitis    s/p pancreatectomy   Diabetes mellitus with complication (HCC)    H/O splenectomy     Past Surgical History:  Procedure Laterality Date   AORTIC VALVE REPLACEMENT N/A 06/28/2022   Procedure: AORTIC VALVE REPLACEMENT (AVR) USING ON-X PROSTHETIC HEART VALVE SIZE ;  Surgeon: Corliss Skains, MD;  Location: [facility] OR;  Service: Open Heart Surgery;  Laterality: N/A;   CORONARY ARTERY BYPASS GRAFT N/A 06/28/2022   Procedure: CORONARY ARTERY BYPASS GRAFTING (CABG) X2 USING LEFT INTERNAL MAMMARY ARTERY AND RIGHT GREATER SAPHENOUS VEIN HARVESTED ENDOSCOPICALLY;  Surgeon: Corliss Skains, MD;  Location: [facility] OR;  Service: Open Heart Surgery;  Laterality: N/A;   DENTAL RESTORATION/EXTRACTION WITH X-RAY N/A 06/23/2022   Procedure: DENTAL RESTORATION/EXTRACTION WITH POSSIBLE X-RAY;  Surgeon: Ocie Doyne, DMD;  Location: [facility] OR;  Service: Oral Surgery;  Laterality: N/A;   LEFT HEART CATH AND CORONARY ANGIOGRAPHY N/A 06/15/2022   Procedure: LEFT HEART CATH AND CORONARY ANGIOGRAPHY;  Surgeon: Yvonne Kendall, MD;  Location: [facility] INVASIVE CV LAB;  Service: Cardiovascular;  Laterality: N/A;   PANCREATECTOMY N/A    SPLENECTOMY     TEE WITHOUT CARDIOVERSION N/A 06/28/2022   Procedure: TRANSESOPHAGEAL ECHOCARDIOGRAM;  Surgeon: Corliss Skains, MD;  Location: [facility] OR;  Service: Open Heart Surgery;   Laterality: N/A;    Current Medications: No outpatient medications have been marked as taking for the 03/01/23 encounter (Appointment) with Sondra Barges, PA-C.    Allergies:   Patient has no known allergies.   Social History   Socioeconomic History   Marital status: Single    Spouse name: Not on file   Number of children: 2   Years of education: Not on file   Highest education level: Not on file  Occupational History   Occupation: Disable  Tobacco Use   Smoking status: Some Days    Current packs/day: 0.75    Types: Cigarettes   Smokeless tobacco: Never  Vaping Use   Vaping status: Never Used  Substance and Sexual Activity   Alcohol use: Not Currently   Drug use: Not Currently   Sexual activity: Not on file  Other Topics Concern   Not on file  Social History Narrative   Not on file   Social Determinants of Health   Financial Resource Strain: Low Risk  (09/03/2021)   Overall Financial Resource Strain (CARDIA)    Difficulty of Paying Living Expenses: Not hard at all  Food Insecurity: No Food Insecurity (07/07/2022)   Hunger Vital Sign    Worried About Running Out of Food in the Last Year: Never true    Ran Out of Food in the Last Year: Never true  Transportation Needs: No Transportation Needs (07/07/2022)   PRAPARE - Administrator, Civil Service (Medical): No    Lack of Transportation (Non-Medical): No  Physical Activity: Not on file  Stress: No Stress Concern Present (09/03/2021)   [employer] of Occupational Health - Occupational Stress Questionnaire    Feeling of Stress : Only a little  Social Connections: Moderately Isolated (09/03/2021)   Social Connection and Isolation Panel [NHANES]    Frequency of Communication with Friends and Family: Twice a week    Frequency of Social Gatherings with Friends and Family: More than three times a week    Attends Religious Services: Never    Database administrator or Organizations: No    Attends Museum/gallery exhibitions officer: Not on file    Marital Status: Living with partner     Family History:  The patient's family history includes Alcohol abuse in his maternal grandfather; CVA in his maternal grandmother; Healthy in his brother and sister; Liver cancer in his maternal grandfather; Lung cancer in his father.  ROS:   12-point review of systems is negative unless otherwise noted in the HPI.   EKGs/Labs/Other Studies Reviewed:    Studies reviewed were summarized above. The additional studies were reviewed today:  2D echo 08/09/2022: 1. Left ventricular ejection fraction, by estimation, is 40 to 45%. The  left ventricle has mildly decreased function. The left ventricle  demonstrates regional wall motion abnormalities (see scoring  diagram/findings for description). There is mild left  ventricular hypertrophy. Left ventricular diastolic parameters were  normal. There is severe hypokinesis of the left ventricular, mid-apical  anteroseptal wall and anterior wall.   2. Right ventricular systolic function is normal. The right ventricular  size is normal. Tricuspid regurgitation signal is inadequate for assessing  PA pressure.   3. The mitral valve is normal in  structure. No evidence of mitral valve  regurgitation. No evidence of mitral stenosis.   4. The aortic valve has been repaired/replaced. Aortic valve  regurgitation is not visualized. No aortic stenosis is present. Echo  findings are consistent with normal structure and function of the aortic  valve prosthesis.  __________   TEE 06/28/2022: POST-OP IMPRESSIONS  _ Aortic Valve: No stenosis present. A bileaflet mechanical valve was  placed,  leaflets are freely mobile. There is no regurgitation. Normal washing jets  for  valve type. No perivalvular leak noted.  _ Comments: Aortic valve replaced with normal function. Lv function  improved.  Exam otherwise unchanged.  __________   Pre-CABG vascular ultrasound  06/20/2022: Summary:  Right Carotid: Velocities in the right ICA are consistent with a 1-39%  stenosis.   Left Carotid: Velocities in the left ICA are consistent with a 1-39%  stenosis.  Vertebrals: Bilateral vertebral arteries demonstrate antegrade flow.  Subclavians: Normal flow hemodynamics were seen in bilateral subclavian               arteries.   Right ABI: Resting right ankle-brachial index is within normal range. The  right toe-brachial index is abnormal.  Left ABI: Resting left ankle-brachial index indicates moderate left lower  extremity arterial disease. The left toe-brachial index is abnormal.  Right Upper Extremity: Doppler waveforms decrease <50% with right radial  compression. Doppler waveform obliterate with right ulnar compression.  Left Upper Extremity: Doppler waveform obliterate with left radial  compression. Doppler waveform obliterate with left ulnar compression.  __________   2D echo 06/16/2022: 1. Left ventricular ejection fraction, by estimation, is 40%. The left  ventricle has mild to moderately decreased function. The left ventricle  demonstrates regional wall motion abnormalities with mid to apical  anteroseptal and inferoseptal akinesis and  anterior hypokinesis. The left ventricular internal cavity size was mildly  dilated. Left ventricular diastolic parameters were normal.   2. Right ventricular systolic function is normal. The right ventricular  size is normal. Tricuspid regurgitation signal is inadequate for assessing  PA pressure.   3. The mitral valve is normal in structure. No evidence of mitral valve  regurgitation. No evidence of mitral stenosis.   4. The aortic valve is probaby tricuspid though not visualized ideally.  There is mild calcification of the aortic valve. Aortic valve  regurgitation is moderate to severe. There was no holodiastolic flow  reversal noted in the descending thoracic aorta.  No aortic stenosis is present.   5. The  inferior vena cava is normal in size with <50% respiratory  variability, suggesting right atrial pressure of 8 mmHg.  __________   LHC 06/15/2022: Conclusions: Severe complex ostial/proximal LAD involving moderate-caliber D1 that is difficult to visualize due to vessel overlap.  Lesion appears to be an acute plaque rupture with up to 90% stenosis involving the LAD and 75% stenosis involving D1 leading to the patient's NSTEMI.  LMCA, ramus intermedius, LCx, and RCA are without significant disease. Mildly reduced left ventricular systolic function with mild mid and apical anterior and apical inferior hypokinesis (LVEF 45-50%). Normal left ventricular filling pressure (LVEDP 6 mmHg).   Recommendations: Case reviewed with Dr. Herbie Baltimore (interventional cardiology) and Dr. Cliffton Asters (cardiothoracic surgery).  Given proximal LAD disease involving sizeable D1 and adjacent ramus intermedius branch, PCI would be high risk.  In the setting of his DM and LAD/D1 disease, we have agreed that CABG may be the best revascularization option.  Given that Mr. Borton is currently chest pain free and  hemodynamically stable, we will transfer him to [facility] for urgent evaluation and possible CABG as soon as tomorrow. Start IV heparin 2 hours after TR band deflation. High-intensity statin therapy. Aspirin 81 mg daily; defer P2Y12 inhibitor pending cardiac surgery evaluation. Start metoprolol tartrate 12.5 mg twice daily.   EKG:  EKG is ordered today.  The EKG ordered today demonstrates ***  Recent Labs: 06/16/2022: TSH 3.113 06/29/2022: Magnesium 1.8 12/14/2022: ALT 29; BUN 12; Creat 1.18; Hemoglobin 13.2; Platelets 303; Potassium 4.6; Sodium 139  Recent Lipid Panel    Component Value Date/Time   CHOL 51 12/14/2022 0934   TRIG 39 12/14/2022 0934   HDL 30 (L) 12/14/2022 0934   CHOLHDL 1.7 12/14/2022 0934   VLDL 12 06/15/2022 1553   LDLCALC 10 12/14/2022 0934    PHYSICAL EXAM:    VS:  There were no vitals taken  for this visit.  BMI: There is no height or weight on file to calculate BMI.  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 stress test, cath, DCCV, TEE, etc)?   Press F2        :[id number]}     Disposition: F/u with Dr. Okey Dupre or an APP in ***.   Medication Adjustments/Labs and Tests Ordered: Current medicines are reviewed at length with the patient today.  Concerns regarding medicines are outlined above. Medication changes, Labs and Tests ordered today are summarized above and listed in the Patient Instructions accessible in Encounters.   Signed, Eula Listen, PA-C 02/27/2023 9:40 AM     [facility] [address] [place], [place] [address] [phone]

## 2023-03-01 ENCOUNTER — Ambulatory Visit: Payer: Medicare Other | Admitting: Physician Assistant

## 2023-03-07 ENCOUNTER — Other Ambulatory Visit: Payer: Self-pay | Admitting: Internal Medicine

## 2023-03-07 DIAGNOSIS — Z7901 Long term (current) use of anticoagulants: Secondary | ICD-10-CM

## 2023-03-07 DIAGNOSIS — Z952 Presence of prosthetic heart valve: Secondary | ICD-10-CM

## 2023-03-07 DIAGNOSIS — Z5181 Encounter for therapeutic drug level monitoring: Secondary | ICD-10-CM

## 2023-03-11 DIAGNOSIS — R52 Pain, unspecified: Secondary | ICD-10-CM | POA: Diagnosis not present

## 2023-03-11 DIAGNOSIS — Z79899 Other long term (current) drug therapy: Secondary | ICD-10-CM | POA: Diagnosis not present

## 2023-03-14 ENCOUNTER — Encounter: Payer: Self-pay | Admitting: Physician Assistant

## 2023-03-14 ENCOUNTER — Ambulatory Visit: Payer: Medicare Other | Attending: Physician Assistant | Admitting: Physician Assistant

## 2023-03-14 VITALS — BP 110/50 | HR 78 | Ht 72.0 in | Wt 139.2 lb

## 2023-03-14 DIAGNOSIS — I255 Ischemic cardiomyopathy: Secondary | ICD-10-CM | POA: Diagnosis not present

## 2023-03-14 DIAGNOSIS — Z79899 Other long term (current) drug therapy: Secondary | ICD-10-CM | POA: Diagnosis not present

## 2023-03-14 DIAGNOSIS — I2510 Atherosclerotic heart disease of native coronary artery without angina pectoris: Secondary | ICD-10-CM

## 2023-03-14 DIAGNOSIS — Z951 Presence of aortocoronary bypass graft: Secondary | ICD-10-CM | POA: Diagnosis not present

## 2023-03-14 DIAGNOSIS — Z720 Tobacco use: Secondary | ICD-10-CM

## 2023-03-14 DIAGNOSIS — I351 Nonrheumatic aortic (valve) insufficiency: Secondary | ICD-10-CM

## 2023-03-14 DIAGNOSIS — I5022 Chronic systolic (congestive) heart failure: Secondary | ICD-10-CM | POA: Diagnosis not present

## 2023-03-14 DIAGNOSIS — I10 Essential (primary) hypertension: Secondary | ICD-10-CM

## 2023-03-14 DIAGNOSIS — Z952 Presence of prosthetic heart valve: Secondary | ICD-10-CM | POA: Diagnosis not present

## 2023-03-14 NOTE — Progress Notes (Signed)
Cardiology Office Note    Date:  03/14/2023   ID:  Ian Gross, DOB [birth year], MRN [id number]  PCP:  Lorre Munroe, NP  Cardiologist:  Yvonne Kendall, MD  Electrophysiologist:  None   Chief Complaint: Follow-up  History of Present Illness:   Ian Gross is a 47 y.o. 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   He was admitted to the hospital in 05/2022 with NSTEMI.  LHC showed severe proximal LAD disease along with moderate D1 stenosis extending back into the ostium.  The left main, ramus, LCx, and RCA were without significant disease.  PCI was felt to be too high risk with recommendation for CABG.  LVEF 45 to 50% with normal LVEDP.  Echo showed an EF of 40%, mid to apical anteroseptal and inferoseptal akinesis and anterior hypokinesis, mildly dilated LV internal cavity size, normal LV diastolic function parameters, normal RV systolic function and ventricular cavity size, moderate to severe aortic regurgitation, and an estimated right atrial pressure of 8 mmHg.  He was also noted to have poor dentition with dental consultation leading to multiple extractions prior to cardiothoracic surgery.  He underwent two-vessel CABG with LIMA to LAD and SVG to diagonal with mechanical AVR in 06/2022.  Postoperative course was notable for transfusion dependent anemia as well as hypotension and postoperative pain.  Follow-up echo on 08/09/2022 demonstrated an EF of 40 to 45%, mild LVH, severe hypokinesis of the mid to apical anteroseptal and anterior wall, normal RV systolic function and ventricular cavity size, and normal structure and function of aortic  valve prosthesis.  He was most recently seen in the office in 10/2022 and remained without symptoms of angina or cardiac decompensation.  He noted significant improvement in fatigue following transition from Lopressor to Toprol.  He continued to smoke 1/2 to 3/4 pack/day.  Relative hypotension continued to preclude escalation of GDMT.  He comes in doing well from a cardiac perspective and is without symptoms of angina or cardiac decompensation.  He does continue to note some underlying fatigue, though this is significantly improved when compared to prior visits.  No lower extremity swelling, abdominal distention, early satiety, or progressive orthopnea.  He did notice some dark stools on 12/15, after taking Pepto-Bismol.  No BM since.  No BRBPR.  Remains adherent to cardiac medications including aspirin and warfarin.  Weight stable.  Continues to smoke 1/2-3/4 of a pack per day.,  Feels like he is doing well from a cardiac perspective and does not have any acute cardiac concerns at this time.   Labs independently reviewed: 02/16/2023 - INR 2.5 11/2022 - A1c 8.9, TC 51, TG 39, HDL 30, LDL 10, BUN 12, serum creatinine 1.18, potassium 4.6, albumin 3.3, AST/ALT normal, Hgb 13.2, PLT 303 05/2022 - TSH normal  Past Medical History:  Diagnosis Date   Alcoholic pancreatitis    s/p pancreatectomy   Diabetes mellitus with complication (HCC)    H/O splenectomy     Past Surgical History:  Procedure Laterality Date   AORTIC VALVE REPLACEMENT N/A 06/28/2022   Procedure: AORTIC VALVE REPLACEMENT (AVR) USING ON-X PROSTHETIC HEART VALVE SIZE ;  Surgeon: Corliss Skains, MD;  Location: [facility] OR;  Service: Open Heart  Surgery;  Laterality: N/A;   CORONARY ARTERY BYPASS GRAFT N/A 06/28/2022   Procedure: CORONARY ARTERY BYPASS GRAFTING (CABG) X2 USING LEFT INTERNAL MAMMARY ARTERY AND RIGHT GREATER SAPHENOUS VEIN HARVESTED ENDOSCOPICALLY;  Surgeon: Corliss Skains, MD;  Location: [facility] OR;  Service: Open Heart  Surgery;  Laterality: N/A;   DENTAL RESTORATION/EXTRACTION WITH X-RAY N/A 06/23/2022   Procedure: DENTAL RESTORATION/EXTRACTION WITH POSSIBLE X-RAY;  Surgeon: Ocie Doyne, DMD;  Location: [facility] OR;  Service: Oral Surgery;  Laterality: N/A;   LEFT HEART CATH AND CORONARY ANGIOGRAPHY N/A 06/15/2022   Procedure: LEFT HEART CATH AND CORONARY ANGIOGRAPHY;  Surgeon: Yvonne Kendall, MD;  Location: [facility] INVASIVE CV LAB;  Service: Cardiovascular;  Laterality: N/A;   PANCREATECTOMY N/A    SPLENECTOMY     TEE WITHOUT CARDIOVERSION N/A 06/28/2022   Procedure: TRANSESOPHAGEAL ECHOCARDIOGRAM;  Surgeon: Corliss Skains, MD;  Location: [facility] OR;  Service: Open Heart Surgery;  Laterality: N/A;    Current Medications: Current Meds  Medication Sig   aspirin EC 81 MG tablet Take 1 tablet (81 mg total) by mouth daily. Swallow whole.   atorvastatin (LIPITOR) 80 MG tablet TAKE 1 TABLET BY MOUTH EVERY DAY   Buprenorphine HCl-Naloxone HCl 8-2 MG FILM Place 1 Film under the tongue 2 (two) times daily.   HUMALOG KWIKPEN 100 UNIT/ML KwikPen Inject 4-9 Units into the skin 3 (three) times daily. Sliding scale (go up by 1 unit)   insulin glargine (LANTUS) 100 UNIT/ML injection Inject 10 Units into the skin at bedtime.   metoprolol succinate (TOPROL XL) 25 MG 24 hr tablet Take 0.5 tablets (12.5 mg total) by mouth daily.   Pancrelipase, Lip-Prot-Amyl, (CREON) 24000-76000 units CPEP Take 3 capsules (72,000 Units total) by mouth 3 (three) times daily.   pantoprazole (PROTONIX) 40 MG tablet Take 1 tablet (40 mg total) by mouth daily.   warfarin (COUMADIN) 2.5 MG tablet TAKE 1 TO 2 TABLETS BY MOUTH DAILY OR AS DIRECTED BY COUMADIN CLINIC    Allergies:   Patient has no known allergies.   Social History   Socioeconomic History   Marital status: Single    Spouse name: Not on file   Number of children: 2   Years of education: Not on file   Highest education level: Not on file  Occupational History   Occupation: Disable   Tobacco Use   Smoking status: Some Days    Current packs/day: 0.75    Types: Cigarettes   Smokeless tobacco: Never  Vaping Use   Vaping status: Never Used  Substance and Sexual Activity   Alcohol use: Not Currently   Drug use: Not Currently   Sexual activity: Not on file  Other Topics Concern   Not on file  Social History Narrative   Not on file   Social Drivers of Health   Financial Resource Strain: Low Risk  (09/03/2021)   Overall Financial Resource Strain (CARDIA)    Difficulty of Paying Living Expenses: Not hard at all  Food Insecurity: No Food Insecurity (07/07/2022)   Hunger Vital Sign    Worried About Running Out of Food in the Last Year: Never true    Ran Out of Food in the Last Year: Never true  Transportation Needs: No Transportation Needs (07/07/2022)   PRAPARE - Administrator, Civil Service (Medical): No    Lack of Transportation (Non-Medical): No  Physical Activity: Not on file  Stress: No Stress Concern Present (09/03/2021)   [employer] of Occupational Health - Occupational Stress  Questionnaire    Feeling of Stress : Only a little  Social Connections: Moderately Isolated (09/03/2021)   Social Connection and Isolation Panel [NHANES]    Frequency of Communication with Friends and Family: Twice a week    Frequency of Social Gatherings with Friends and Family: More than three times a week    Attends Religious Services: Never    Database administrator or Organizations: No    Attends Engineer, structural: Not on file    Marital Status: Living with partner     Family History:  The patient's family history includes Alcohol abuse in his maternal grandfather; CVA in his maternal grandmother; Healthy in his brother and sister; Liver cancer in his maternal grandfather; Lung cancer in his father.  ROS:   12-point review of systems is negative unless otherwise noted in the HPI.   EKGs/Labs/Other Studies Reviewed:    Studies reviewed were  summarized above. The additional studies were reviewed today:  2D echo 08/09/2022: 1. Left ventricular ejection fraction, by estimation, is 40 to 45%. The  left ventricle has mildly decreased function. The left ventricle  demonstrates regional wall motion abnormalities (see scoring  diagram/findings for description). There is mild left  ventricular hypertrophy. Left ventricular diastolic parameters were  normal. There is severe hypokinesis of the left ventricular, mid-apical  anteroseptal wall and anterior wall.   2. Right ventricular systolic function is normal. The right ventricular  size is normal. Tricuspid regurgitation signal is inadequate for assessing  PA pressure.   3. The mitral valve is normal in structure. No evidence of mitral valve  regurgitation. No evidence of mitral stenosis.   4. The aortic valve has been repaired/replaced. Aortic valve  regurgitation is not visualized. No aortic stenosis is present. Echo  findings are consistent with normal structure and function of the aortic  valve prosthesis.  __________   TEE 06/28/2022: POST-OP IMPRESSIONS  _ Aortic Valve: No stenosis present. A bileaflet mechanical valve was  placed,  leaflets are freely mobile. There is no regurgitation. Normal washing jets  for  valve type. No perivalvular leak noted.  _ Comments: Aortic valve replaced with normal function. Lv function  improved.  Exam otherwise unchanged.  __________   Pre-CABG vascular ultrasound 06/20/2022: Summary:  Right Carotid: Velocities in the right ICA are consistent with a 1-39%  stenosis.   Left Carotid: Velocities in the left ICA are consistent with a 1-39%  stenosis.  Vertebrals: Bilateral vertebral arteries demonstrate antegrade flow.  Subclavians: Normal flow hemodynamics were seen in bilateral subclavian               arteries.   Right ABI: Resting right ankle-brachial index is within normal range. The  right toe-brachial index is abnormal.  Left  ABI: Resting left ankle-brachial index indicates moderate left lower  extremity arterial disease. The left toe-brachial index is abnormal.  Right Upper Extremity: Doppler waveforms decrease <50% with right radial  compression. Doppler waveform obliterate with right ulnar compression.  Left Upper Extremity: Doppler waveform obliterate with left radial  compression. Doppler waveform obliterate with left ulnar compression.  __________   2D echo 06/16/2022: 1. Left ventricular ejection fraction, by estimation, is 40%. The left  ventricle has mild to moderately decreased function. The left ventricle  demonstrates regional wall motion abnormalities with mid to apical  anteroseptal and inferoseptal akinesis and  anterior hypokinesis. The left ventricular internal cavity size was mildly  dilated. Left ventricular diastolic parameters were normal.   2. Right  ventricular systolic function is normal. The right ventricular  size is normal. Tricuspid regurgitation signal is inadequate for assessing  PA pressure.   3. The mitral valve is normal in structure. No evidence of mitral valve  regurgitation. No evidence of mitral stenosis.   4. The aortic valve is probaby tricuspid though not visualized ideally.  There is mild calcification of the aortic valve. Aortic valve  regurgitation is moderate to severe. There was no holodiastolic flow  reversal noted in the descending thoracic aorta.  No aortic stenosis is present.   5. The inferior vena cava is normal in size with <50% respiratory  variability, suggesting right atrial pressure of 8 mmHg.  __________   LHC 06/15/2022: Conclusions: Severe complex ostial/proximal LAD involving moderate-caliber D1 that is difficult to visualize due to vessel overlap.  Lesion appears to be an acute plaque rupture with up to 90% stenosis involving the LAD and 75% stenosis involving D1 leading to the patient's NSTEMI.  LMCA, ramus intermedius, LCx, and RCA are without  significant disease. Mildly reduced left ventricular systolic function with mild mid and apical anterior and apical inferior hypokinesis (LVEF 45-50%). Normal left ventricular filling pressure (LVEDP 6 mmHg).   Recommendations: Case reviewed with Dr. Herbie Baltimore (interventional cardiology) and Dr. Cliffton Asters (cardiothoracic surgery).  Given proximal LAD disease involving sizeable D1 and adjacent ramus intermedius branch, PCI would be high risk.  In the setting of his DM and LAD/D1 disease, we have agreed that CABG may be the best revascularization option.  Given that Mr. Hoak is currently chest pain free and hemodynamically stable, we will transfer him to [facility] for urgent evaluation and possible CABG as soon as tomorrow. Start IV heparin 2 hours after TR band deflation. High-intensity statin therapy. Aspirin 81 mg daily; defer P2Y12 inhibitor pending cardiac surgery evaluation. Start metoprolol tartrate 12.5 mg twice daily.   EKG:  EKG is ordered today.  The EKG ordered today demonstrates NSR, 70 bpm, right axis deviation, prior septal infarct versus lead placement, nonspecific ST-T changes  Recent Labs: 06/16/2022: TSH 3.113 06/29/2022: Magnesium 1.8 12/14/2022: ALT 29; BUN 12; Creat 1.18; Hemoglobin 13.2; Platelets 303; Potassium 4.6; Sodium 139  Recent Lipid Panel    Component Value Date/Time   CHOL 51 12/14/2022 0934   TRIG 39 12/14/2022 0934   HDL 30 (L) 12/14/2022 0934   CHOLHDL 1.7 12/14/2022 0934   VLDL 12 06/15/2022 1553   LDLCALC 10 12/14/2022 0934    PHYSICAL EXAM:    VS:  BP (!) 110/50 (BP Location: Right Arm, Patient Position: Sitting, Cuff Size: Normal)   Pulse 78   Ht 6' (1.829 m)   Wt 139 lb 3.2 oz (63.1 kg)   SpO2 97%   BMI 18.88 kg/m   BMI: Body mass index is 18.88 kg/m.  Physical Exam Vitals reviewed.  Constitutional:      Appearance: He is well-developed.  HENT:     Head: Normocephalic and atraumatic.  Eyes:     General:        Right eye: No discharge.         Left eye: No discharge.  Neck:     Vascular: No JVD.  Cardiovascular:     Rate and Rhythm: Normal rate and regular rhythm.     Pulses:          Posterior tibial pulses are 2+ on the right side and 2+ on the left side.     Heart sounds: Normal heart sounds, S1 normal and S2 normal.  Heart sounds not distant. No midsystolic click and no opening snap. No murmur heard.    No friction rub.     Comments: Mechanical click noted in the right upper sternal border. Pulmonary:     Effort: Pulmonary effort is normal. No respiratory distress.     Breath sounds: Normal breath sounds. No decreased breath sounds, wheezing, rhonchi or rales.  Chest:     Chest wall: No tenderness.  Abdominal:     General: There is no distension.  Musculoskeletal:     Cervical back: Normal range of motion.     Right lower leg: No edema.     Left lower leg: No edema.  Skin:    General: Skin is warm and dry.     Nails: There is no clubbing.  Neurological:     Mental Status: He is alert and oriented to person, place, and time.  Psychiatric:        Speech: Speech normal.        Behavior: Behavior normal.        Thought Content: Thought content normal.        Judgment: Judgment normal.     Wt Readings from Last 3 Encounters:  03/14/23 139 lb 3.2 oz (63.1 kg)  01/24/23 129 lb (58.5 kg)  12/14/22 136 lb (61.7 kg)     ASSESSMENT & PLAN:   CAD status post CABG without angina: He is doing well and without symptoms concerning for angina or cardiac decompensation.  Continue aggressive risk factor modification and secondary prevention including aspirin and atorvastatin.  He has declined cardiac rehab.  No indication for further ischemic testing at this time.  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  Not requiring a standing loop diuretic.  Continue Toprol-XL 12.5 mg daily.  Relative hypotension with positional dizziness has precluded escalation of GDMT.  Repeat limited echo to evaluate for  improvement in LV systolic function following surgical revascularization and on maximally tolerated GDMT.  With noted fatigue.  Anticipate transitioning from beta-blocker to ARB after echo results.  Moderate to severe aortic regurgitation status post mechanical AVR: Remains on warfarin, followed by Coumadin clinic.  Continue aspirin as outlined above along with Protonix.  SBE prophylaxis indicated for all dental procedures, he was reminded of this.  Normal functioning aortic valve prosthesis by echo in 07/2022.  HTN: Blood pressure is well-controlled in the office today.  Remains on Toprol-XL outlined above.  Tobacco use: Continues to smoke 1/2 to 3/4 pack/day.  Complete cessation is encouraged.     Disposition: F/u with Dr. Okey Dupre or an APP in 2 months.   Medication Adjustments/Labs and Tests Ordered: Current medicines are reviewed at length with the patient today.  Concerns regarding medicines are outlined above. Medication changes, Labs and Tests ordered today are summarized above and listed in the Patient Instructions accessible in Encounters.   Signed, Eula Listen, PA-C 03/14/2023 3:21 PM     [facility] [place] [address] [place], [place] [address] [phone]

## 2023-03-14 NOTE — Patient Instructions (Signed)
Medication Instructions:  Your Physician recommend you continue on your current medication as directed.     *If you need a refill on your cardiac medications before your next appointment, please call your pharmacy*   Lab Work: Your provider would like for you to have following labs drawn today CBC and BMP.   If you have labs (blood work) drawn today and your tests are completely normal, you will receive your results only by: MyChart Message (if you have MyChart) OR A paper copy in the mail If you have any lab test that is abnormal or we need to change your treatment, we will call you to review the results.   Testing/Procedures: Your physician has requested that you have a limited echocardiogram. Echocardiography is a painless test that uses sound waves to create images of your heart. It provides your doctor with information about the size and shape of your heart and how well your heart's chambers and valves are working.   You may receive an ultrasound enhancing agent through an IV if needed to better visualize your heart during the echo. This procedure takes approximately one hour.  There are no restrictions for this procedure.  This will take place at [address] [facility]) [address], [place] [address]  Please note: We ask at that you not bring children with you during ultrasound (echo/ vascular) testing. Due to room size and safety concerns, children are not allowed in the ultrasound rooms during exams. Our front office staff cannot provide observation of children in our lobby area while testing is being conducted. An adult accompanying a patient to their appointment will only be allowed in the ultrasound room at the discretion of the ultrasound technician under special circumstances. We apologize for any inconvenience.    Follow-Up: At [facility], you and your health needs are our priority.  As part of our continuing mission to provide you with exceptional  heart care, we have created designated Provider Care Teams.  These Care Teams include your primary Cardiologist (physician) and Advanced Practice Providers (APPs -  Physician Assistants and Nurse Practitioners) who all work together to provide you with the care you need, when you need it.  We recommend signing up for the patient portal called "MyChart".  Sign up information is provided on this After Visit Summary.  MyChart is used to connect with patients for Virtual Visits (Telemedicine).  Patients are able to view lab/test results, encounter notes, upcoming appointments, etc.  Non-urgent messages can be sent to your provider as well.   To learn more about what you can do with MyChart, go to [email or web address].    Your next appointment:   2 month(s)  Provider:   You may see Yvonne Kendall, MD or one of the following Advanced Practice Providers on your designated Care Team:   Nicolasa Ducking, NP Eula Listen, PA-C Cadence Fransico Michael, PA-C Charlsie Quest, NP Carlos Levering, NP

## 2023-03-15 LAB — CBC
Hematocrit: 40.5 % (ref 37.5–51.0)
Hemoglobin: 13.6 g/dL (ref 13.0–17.7)
MCH: 32.7 pg (ref 26.6–33.0)
MCHC: 33.6 g/dL (ref 31.5–35.7)
MCV: 97 fL (ref 79–97)
Platelets: 256 10*3/uL (ref 150–450)
RBC: 4.16 x10E6/uL (ref 4.14–5.80)
RDW: 12.5 % (ref 11.6–15.4)
WBC: 10.2 10*3/uL (ref 3.4–10.8)

## 2023-03-15 LAB — BASIC METABOLIC PANEL
BUN/Creatinine Ratio: 12 (ref 9–20)
BUN: 13 mg/dL (ref 6–24)
CO2: 23 mmol/L (ref 20–29)
Calcium: 8.7 mg/dL (ref 8.7–10.2)
Chloride: 102 mmol/L (ref 96–106)
Creatinine, Ser: 1.1 mg/dL (ref 0.76–1.27)
Glucose: 264 mg/dL — ABNORMAL HIGH (ref 70–99)
Potassium: 5 mmol/L (ref 3.5–5.2)
Sodium: 139 mmol/L (ref 134–144)
eGFR: 82 mL/min/{1.73_m2} (ref >59)

## 2023-03-19 DIAGNOSIS — Z720 Tobacco use: Secondary | ICD-10-CM | POA: Diagnosis not present

## 2023-03-19 DIAGNOSIS — Z79899 Other long term (current) drug therapy: Secondary | ICD-10-CM | POA: Diagnosis not present

## 2023-03-19 DIAGNOSIS — R52 Pain, unspecified: Secondary | ICD-10-CM | POA: Diagnosis not present

## 2023-03-19 DIAGNOSIS — K861 Other chronic pancreatitis: Secondary | ICD-10-CM | POA: Diagnosis not present

## 2023-03-29 ENCOUNTER — Ambulatory Visit: Payer: Medicare Other | Attending: Internal Medicine

## 2023-03-29 DIAGNOSIS — Z5181 Encounter for therapeutic drug level monitoring: Secondary | ICD-10-CM | POA: Diagnosis not present

## 2023-03-29 DIAGNOSIS — Z952 Presence of prosthetic heart valve: Secondary | ICD-10-CM

## 2023-03-29 LAB — POCT INR: INR: 1.8 — AB (ref 2.0–3.0)

## 2023-03-29 NOTE — Patient Instructions (Signed)
 Take 2 tablets today only then Continue 1 tablet daily, except 1.5 tablets every Wednesday Recheck in 6  weeks.  Call with new medications or bleeding problems [facility] [phone] or [place] office [phone].

## 2023-04-01 ENCOUNTER — Ambulatory Visit: Payer: Medicare Other

## 2023-04-06 ENCOUNTER — Ambulatory Visit: Payer: Medicare Other | Attending: Physician Assistant

## 2023-04-12 ENCOUNTER — Other Ambulatory Visit: Payer: Self-pay | Admitting: Cardiology

## 2023-04-20 ENCOUNTER — Ambulatory Visit: Payer: Medicare Other

## 2023-04-20 DIAGNOSIS — E139 Other specified diabetes mellitus without complications: Secondary | ICD-10-CM | POA: Diagnosis not present

## 2023-04-20 LAB — MICROALBUMIN / CREATININE URINE RATIO
Microalb Creat Ratio: UNDETERMINED
Microalb Creat Ratio: UNDETERMINED

## 2023-04-20 LAB — PROTEIN / CREATININE RATIO, URINE
Albumin, U: 0.3
Albumin, U: 0.3
Creatinine, Urine: 304.9
Creatinine, Urine: 304.9

## 2023-04-20 LAB — MICROALBUMIN, URINE: Microalb, Ur: 0.2

## 2023-04-20 LAB — HEMOGLOBIN A1C: Hemoglobin A1C: 8.5

## 2023-04-21 ENCOUNTER — Encounter: Payer: Self-pay | Admitting: Internal Medicine

## 2023-04-22 DIAGNOSIS — Z79899 Other long term (current) drug therapy: Secondary | ICD-10-CM | POA: Diagnosis not present

## 2023-04-22 DIAGNOSIS — R52 Pain, unspecified: Secondary | ICD-10-CM | POA: Diagnosis not present

## 2023-04-23 DIAGNOSIS — Z79899 Other long term (current) drug therapy: Secondary | ICD-10-CM | POA: Diagnosis not present

## 2023-04-23 DIAGNOSIS — Z720 Tobacco use: Secondary | ICD-10-CM | POA: Diagnosis not present

## 2023-04-23 DIAGNOSIS — K861 Other chronic pancreatitis: Secondary | ICD-10-CM | POA: Diagnosis not present

## 2023-04-23 DIAGNOSIS — R52 Pain, unspecified: Secondary | ICD-10-CM | POA: Diagnosis not present

## 2023-05-03 ENCOUNTER — Other Ambulatory Visit: Payer: Self-pay | Admitting: Internal Medicine

## 2023-05-03 DIAGNOSIS — Z7901 Long term (current) use of anticoagulants: Secondary | ICD-10-CM

## 2023-05-03 DIAGNOSIS — Z5181 Encounter for therapeutic drug level monitoring: Secondary | ICD-10-CM

## 2023-05-03 DIAGNOSIS — Z952 Presence of prosthetic heart valve: Secondary | ICD-10-CM

## 2023-05-04 ENCOUNTER — Ambulatory Visit: Payer: Medicare Other | Attending: Physician Assistant

## 2023-05-04 DIAGNOSIS — I255 Ischemic cardiomyopathy: Secondary | ICD-10-CM

## 2023-05-04 LAB — ECHOCARDIOGRAM LIMITED
AV Mean grad: 7 mmHg
AV Peak grad: 14.7 mmHg
Ao pk vel: 1.92 m/s
Area-P 1/2: 4.06 cm2
S' Lateral: 4.3 cm

## 2023-05-11 ENCOUNTER — Ambulatory Visit: Payer: Medicare Other | Attending: Internal Medicine

## 2023-05-11 DIAGNOSIS — Z5181 Encounter for therapeutic drug level monitoring: Secondary | ICD-10-CM

## 2023-05-11 DIAGNOSIS — Z952 Presence of prosthetic heart valve: Secondary | ICD-10-CM | POA: Diagnosis not present

## 2023-05-11 LAB — POCT INR: INR: 1.5 — AB (ref 2.0–3.0)

## 2023-05-11 NOTE — Patient Instructions (Signed)
Take 2.5 tablets today only then Increase to 1 tablet daily, except 1.5 tablets every Monday and Wednesday Recheck in 4  weeks.  Call with new medications or bleeding problems [facility] [phone] or [place] office [phone].

## 2023-05-20 ENCOUNTER — Encounter: Payer: Self-pay | Admitting: Physician Assistant

## 2023-05-20 ENCOUNTER — Ambulatory Visit: Payer: Medicare Other | Attending: Physician Assistant | Admitting: Physician Assistant

## 2023-05-20 VITALS — BP 115/62 | HR 66 | Ht 72.0 in | Wt 144.2 lb

## 2023-05-20 DIAGNOSIS — Z720 Tobacco use: Secondary | ICD-10-CM | POA: Diagnosis not present

## 2023-05-20 DIAGNOSIS — Z952 Presence of prosthetic heart valve: Secondary | ICD-10-CM

## 2023-05-20 DIAGNOSIS — I351 Nonrheumatic aortic (valve) insufficiency: Secondary | ICD-10-CM | POA: Diagnosis not present

## 2023-05-20 DIAGNOSIS — I10 Essential (primary) hypertension: Secondary | ICD-10-CM | POA: Diagnosis not present

## 2023-05-20 DIAGNOSIS — I255 Ischemic cardiomyopathy: Secondary | ICD-10-CM

## 2023-05-20 DIAGNOSIS — I2510 Atherosclerotic heart disease of native coronary artery without angina pectoris: Secondary | ICD-10-CM | POA: Diagnosis not present

## 2023-05-20 DIAGNOSIS — Z951 Presence of aortocoronary bypass graft: Secondary | ICD-10-CM

## 2023-05-20 DIAGNOSIS — I5022 Chronic systolic (congestive) heart failure: Secondary | ICD-10-CM

## 2023-05-20 DIAGNOSIS — R52 Pain, unspecified: Secondary | ICD-10-CM | POA: Diagnosis not present

## 2023-05-20 NOTE — Progress Notes (Signed)
 Cardiology Office Note    Date:  05/20/2023   ID:  Ian Gross, DOB 26-Jul-1975, MRN [id number]  PCP:  Lorre Munroe, NP  Cardiologist:  Yvonne Kendall, MD  Electrophysiologist:  None   Chief Complaint: Follow-up  History of Present Illness:   Ian Gross is a 48 y.o. 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   He was admitted to the hospital in 05/2022 with NSTEMI.  LHC showed severe proximal LAD disease along with moderate D1 stenosis extending back into the ostium.  The left main, ramus, LCx, and RCA were without significant disease.  PCI was felt to be too high risk with recommendation for CABG.  LVEF 45 to 50% with normal LVEDP.  Echo showed an EF of 40%, mid to apical anteroseptal and inferoseptal akinesis and anterior hypokinesis, mildly dilated LV internal cavity size, normal LV diastolic function parameters, normal RV systolic function and ventricular cavity size, moderate to severe aortic regurgitation, and an estimated right atrial pressure of 8 mmHg.  He was also noted to have poor dentition with dental consultation leading to multiple extractions prior to cardiothoracic surgery.  He underwent two-vessel CABG with LIMA to LAD and SVG to diagonal with mechanical AVR in 06/2022.  Postoperative course was notable for transfusion dependent anemia as well as hypotension and postoperative pain.  Follow-up echo on 08/09/2022 demonstrated an EF of 40 to 45%, mild LVH, severe hypokinesis of the mid to apical anteroseptal and anterior wall, normal RV systolic function and ventricular cavity size, and normal structure and function of aortic valve  prosthesis.  In follow-up visits, relative hypotension has precluded further escalation of GDMT.  He has continued to smoke.  He was most recently seen in the office in 02/2023 and continued to note some underlying fatigue, though this was significantly improved when compared to his prior visits.  Relative hypotension continued to preclude escalation of evidence-based pharmacotherapy.  Limited echo on 05/04/2023 showed an EF of 40 to 45%, anterior, mid to distal anteroseptal, and apical hypokinesis, mildly dilated LV internal cavity size, normal LV diastolic function, normal RV systolic function and ventricular cavity size, mild mitral regurgitation, and an estimated right atrial pressure of 3 mmHg.  He comes in doing well from a cardiac perspective and is without symptoms of angina or cardiac decompensation.  Fatigue much improved.  Feels back to baseline.  Not as active as he would like to be in the cold weather months.  No lower extremity swelling, progressive orthopnea, or abdominal distention.  Adherent and tolerating cardiac medications without off target effect.  No falls or symptoms concerning for bleeding.  Has decreased tobacco use to around 1/2 pack/day.  Interested in joining the gym with his wife.  Tries to follow a low-sodium diet.  Does not have any acute cardiac concerns at this time.   Labs independently reviewed: 04/2023 - INR 1.5 03/2023 - A1c 8.5 02/2023 - Hgb 13.6, PLT 256, BUN 13, serum creatinine 1.1, potassium 5.0 11/2022 - TC 51, TG 39, HDL 30, LDL 10, albumin 3.3, AST/ALT normal 05/2022 - TSH normal  Past Medical History:  Diagnosis Date   Alcoholic pancreatitis    s/p pancreatectomy   Diabetes mellitus with  complication (HCC)    H/O splenectomy     Past Surgical History:  Procedure Laterality Date   AORTIC VALVE REPLACEMENT N/A 06/28/2022   Procedure: AORTIC VALVE REPLACEMENT (AVR) USING ON-X PROSTHETIC HEART VALVE SIZE ;  Surgeon: Corliss Skains, MD;  Location: [facility]  OR;  Service: Open Heart Surgery;  Laterality: N/A;   CORONARY ARTERY BYPASS GRAFT N/A 06/28/2022   Procedure: CORONARY ARTERY BYPASS GRAFTING (CABG) X2 USING LEFT INTERNAL MAMMARY ARTERY AND RIGHT GREATER SAPHENOUS VEIN HARVESTED ENDOSCOPICALLY;  Surgeon: Corliss Skains, MD;  Location: [facility] OR;  Service: Open Heart Surgery;  Laterality: N/A;   DENTAL RESTORATION/EXTRACTION WITH X-RAY N/A 06/23/2022   Procedure: DENTAL RESTORATION/EXTRACTION WITH POSSIBLE X-RAY;  Surgeon: Ocie Doyne, DMD;  Location: [facility] OR;  Service: Oral Surgery;  Laterality: N/A;   LEFT HEART CATH AND CORONARY ANGIOGRAPHY N/A 06/15/2022   Procedure: LEFT HEART CATH AND CORONARY ANGIOGRAPHY;  Surgeon: Yvonne Kendall, MD;  Location: [facility] INVASIVE CV LAB;  Service: Cardiovascular;  Laterality: N/A;   PANCREATECTOMY N/A    SPLENECTOMY     TEE WITHOUT CARDIOVERSION N/A 06/28/2022   Procedure: TRANSESOPHAGEAL ECHOCARDIOGRAM;  Surgeon: Corliss Skains, MD;  Location: [facility] OR;  Service: Open Heart Surgery;  Laterality: N/A;    Current Medications: Current Meds  Medication Sig   aspirin EC 81 MG tablet Take 1 tablet (81 mg total) by mouth daily. Swallow whole.   atorvastatin (LIPITOR) 80 MG tablet TAKE 1 TABLET BY MOUTH EVERY DAY   Buprenorphine HCl-Naloxone HCl 8-2 MG FILM Place 1 Film under the tongue 2 (two) times daily.   HUMALOG KWIKPEN 100 UNIT/ML KwikPen Inject 4-9 Units into the skin 3 (three) times daily. Sliding scale (go up by 1 unit)   insulin glargine (LANTUS) 100 UNIT/ML injection Inject 10 Units into the skin at bedtime.   metoprolol succinate (TOPROL XL) 25 MG 24 hr tablet Take 0.5 tablets (12.5 mg total) by mouth daily.   Pancrelipase, Lip-Prot-Amyl, (CREON) 24000-76000 units CPEP Take 3 capsules (72,000 Units total) by mouth 3 (three) times daily.   pantoprazole (PROTONIX) 40 MG tablet Take 1 tablet (40 mg total) by mouth daily.   warfarin (COUMADIN) 2.5 MG tablet TAKE 1 TO 2 TABLETS BY MOUTH DAILY OR AS  DIRECTED BY COUMADIN CLINIC    Allergies:   Patient has no known allergies.   Social History   Socioeconomic History   Marital status: Single    Spouse name: Not on file   Number of children: 2   Years of education: Not on file   Highest education level: Not on file  Occupational History   Occupation: Disable  Tobacco Use   Smoking status: Some Days    Current packs/day: 0.75    Types: Cigarettes   Smokeless tobacco: Never  Vaping Use   Vaping status: Never Used  Substance and Sexual Activity   Alcohol use: Not Currently   Drug use: Not Currently   Sexual activity: Not on file  Other Topics Concern   Not on file  Social History Narrative   Not on file   Social Drivers of Health   Financial Resource Strain: Low Risk  (09/03/2021)   Overall Financial Resource Strain (CARDIA)    Difficulty of Paying Living Expenses: Not hard at all  Food Insecurity: No Food Insecurity (07/07/2022)   Hunger Vital Sign    Worried About Running Out of Food in the Last Year: Never true    Ran Out of Food in the Last Year:  Never true  Transportation Needs: No Transportation Needs (07/07/2022)   PRAPARE - Administrator, Civil Service (Medical): No    Lack of Transportation (Non-Medical): No  Physical Activity: Not on file  Stress: No Stress Concern Present (09/03/2021)   [employer] of Occupational Health - Occupational Stress Questionnaire    Feeling of Stress : Only a little  Social Connections: Moderately Isolated (09/03/2021)   Social Connection and Isolation Panel [NHANES]    Frequency of Communication with Friends and Family: Twice a week    Frequency of Social Gatherings with Friends and Family: More than three times a week    Attends Religious Services: Never    Database administrator or Organizations: No    Attends Engineer, structural: Not on file    Marital Status: Living with partner     Family History:  The patient's family history includes Alcohol  abuse in his maternal grandfather; CVA in his maternal grandmother; Healthy in his brother and sister; Liver cancer in his maternal grandfather; Lung cancer in his father.  ROS:   12-point review of systems is negative unless otherwise noted in the HPI.   EKGs/Labs/Other Studies Reviewed:    Studies reviewed were summarized above. The additional studies were reviewed today:  Limited echo 05/04/2023: 1. Left ventricular ejection fraction, by estimation, is 40 to 45%. Left  ventricular ejection fraction by PLAX is 48 %. The left ventricle has  mildly decreased function. The left ventricle demonstrates regional wall  motion abnormalities (anterior, mid  to distal anteroseptal and apical hypokinesis). The left ventricular  internal cavity size was mildly dilated. Left ventricular diastolic  parameters were normal.   2. Right ventricular systolic function is normal. The right ventricular  size is normal. Tricuspid regurgitation signal is inadequate for assessing  PA pressure.   3. The mitral valve is normal in structure. Mild mitral valve  regurgitation. No evidence of mitral stenosis.   4. The aortic valve is normal in structure. Aortic valve regurgitation is  not visualized. No aortic stenosis is present.   5. The inferior vena cava is normal in size with greater than 50%  respiratory variability, suggesting right atrial pressure of 3 mmHg.  __________  2D echo 08/09/2022: 1. Left ventricular ejection fraction, by estimation, is 40 to 45%. The  left ventricle has mildly decreased function. The left ventricle  demonstrates regional wall motion abnormalities (see scoring  diagram/findings for description). There is mild left  ventricular hypertrophy. Left ventricular diastolic parameters were  normal. There is severe hypokinesis of the left ventricular, mid-apical  anteroseptal wall and anterior wall.   2. Right ventricular systolic function is normal. The right ventricular  size is  normal. Tricuspid regurgitation signal is inadequate for assessing  PA pressure.   3. The mitral valve is normal in structure. No evidence of mitral valve  regurgitation. No evidence of mitral stenosis.   4. The aortic valve has been repaired/replaced. Aortic valve  regurgitation is not visualized. No aortic stenosis is present. Echo  findings are consistent with normal structure and function of the aortic  valve prosthesis.  __________   TEE 06/28/2022: POST-OP IMPRESSIONS  _ Aortic Valve: No stenosis present. A bileaflet mechanical valve was  placed,  leaflets are freely mobile. There is no regurgitation. Normal washing jets  for  valve type. No perivalvular leak noted.  _ Comments: Aortic valve replaced with normal function. Lv function  improved.  Exam otherwise unchanged.  __________  Pre-CABG vascular ultrasound 06/20/2022: Summary:  Right Carotid: Velocities in the right ICA are consistent with a 1-39%  stenosis.   Left Carotid: Velocities in the left ICA are consistent with a 1-39%  stenosis.  Vertebrals: Bilateral vertebral arteries demonstrate antegrade flow.  Subclavians: Normal flow hemodynamics were seen in bilateral subclavian               arteries.   Right ABI: Resting right ankle-brachial index is within normal range. The  right toe-brachial index is abnormal.  Left ABI: Resting left ankle-brachial index indicates moderate left lower  extremity arterial disease. The left toe-brachial index is abnormal.  Right Upper Extremity: Doppler waveforms decrease <50% with right radial  compression. Doppler waveform obliterate with right ulnar compression.  Left Upper Extremity: Doppler waveform obliterate with left radial  compression. Doppler waveform obliterate with left ulnar compression.  __________   2D echo 06/16/2022: 1. Left ventricular ejection fraction, by estimation, is 40%. The left  ventricle has mild to moderately decreased function. The left ventricle   demonstrates regional wall motion abnormalities with mid to apical  anteroseptal and inferoseptal akinesis and  anterior hypokinesis. The left ventricular internal cavity size was mildly  dilated. Left ventricular diastolic parameters were normal.   2. Right ventricular systolic function is normal. The right ventricular  size is normal. Tricuspid regurgitation signal is inadequate for assessing  PA pressure.   3. The mitral valve is normal in structure. No evidence of mitral valve  regurgitation. No evidence of mitral stenosis.   4. The aortic valve is probaby tricuspid though not visualized ideally.  There is mild calcification of the aortic valve. Aortic valve  regurgitation is moderate to severe. There was no holodiastolic flow  reversal noted in the descending thoracic aorta.  No aortic stenosis is present.   5. The inferior vena cava is normal in size with <50% respiratory  variability, suggesting right atrial pressure of 8 mmHg.  __________   LHC 06/15/2022: Conclusions: Severe complex ostial/proximal LAD involving moderate-caliber D1 that is difficult to visualize due to vessel overlap.  Lesion appears to be an acute plaque rupture with up to 90% stenosis involving the LAD and 75% stenosis involving D1 leading to the patient's NSTEMI.  LMCA, ramus intermedius, LCx, and RCA are without significant disease. Mildly reduced left ventricular systolic function with mild mid and apical anterior and apical inferior hypokinesis (LVEF 45-50%). Normal left ventricular filling pressure (LVEDP 6 mmHg).   Recommendations: Case reviewed with Dr. Herbie Baltimore (interventional cardiology) and Dr. Cliffton Asters (cardiothoracic surgery).  Given proximal LAD disease involving sizeable D1 and adjacent ramus intermedius branch, PCI would be high risk.  In the setting of his DM and LAD/D1 disease, we have agreed that CABG may be the best revascularization option.  Given that Ian Gross is currently chest pain free  and hemodynamically stable, we will transfer him to [facility] for urgent evaluation and possible CABG as soon as tomorrow. Start IV heparin 2 hours after TR band deflation. High-intensity statin therapy. Aspirin 81 mg daily; defer P2Y12 inhibitor pending cardiac surgery evaluation. Start metoprolol tartrate 12.5 mg twice daily.   EKG:  EKG is ordered today.  The EKG ordered today demonstrates NSR, 66 bpm, no acute ST-T changes  Recent Labs: 06/16/2022: TSH 3.113 06/29/2022: Magnesium 1.8 12/14/2022: ALT 29 03/14/2023: BUN 13; Creatinine, Ser 1.10; Hemoglobin 13.6; Platelets 256; Potassium 5.0; Sodium 139  Recent Lipid Panel    Component Value Date/Time   CHOL 51 12/14/2022 0934  TRIG 39 12/14/2022 0934   HDL 30 (L) 12/14/2022 0934   CHOLHDL 1.7 12/14/2022 0934   VLDL 12 06/15/2022 1553   LDLCALC 10 12/14/2022 0934    PHYSICAL EXAM:    VS:  BP 115/62 (BP Location: Left Arm, Patient Position: Sitting, Cuff Size: Normal)   Pulse 66   Ht 6' (1.829 m)   Wt 144 lb 3.2 oz (65.4 kg)   SpO2 99%   BMI 19.56 kg/m   BMI: Body mass index is 19.56 kg/m.  Physical Exam Vitals reviewed.  Constitutional:      Appearance: He is well-developed.  HENT:     Head: Normocephalic and atraumatic.  Eyes:     General:        Right eye: No discharge.        Left eye: No discharge.  Cardiovascular:     Rate and Rhythm: Normal rate and regular rhythm.     Heart sounds: Normal heart sounds, S1 normal and S2 normal. Heart sounds not distant. No midsystolic click and no opening snap. No murmur heard.    No friction rub.     Comments: Mechanical click noted in the right upper sternal border. Pulmonary:     Effort: Pulmonary effort is normal. No respiratory distress.     Breath sounds: Normal breath sounds. No decreased breath sounds, wheezing, rhonchi or rales.  Chest:     Chest wall: No tenderness.  Musculoskeletal:     Cervical back: Normal range of motion.  Skin:    General: Skin is warm  and dry.     Nails: There is no clubbing.  Neurological:     Mental Status: He is alert and oriented to person, place, and time.  Psychiatric:        Speech: Speech normal.        Behavior: Behavior normal.        Thought Content: Thought content normal.        Judgment: Judgment normal.     Wt Readings from Last 3 Encounters:  05/20/23 144 lb 3.2 oz (65.4 kg)  03/14/23 139 lb 3.2 oz (63.1 kg)  01/24/23 129 lb (58.5 kg)     ASSESSMENT & PLAN:   CAD status post CABG without angina: He continues to do well and is without symptoms of angina or cardiac decompensation.  LDL 10 in 11/2022.  Continue aggressive risk factor modification and secondary prevention.  Remains on aspirin 81 mg and atorvastatin 80 mg.  No indication for further ischemic testing at this time.  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  Not requiring a standing loop diuretic.  Continue Toprol-XL 12.5 mg daily.  Relative hypotension with positional dizziness has precluded escalation of GDMT.  Moderate to severe aortic regurgitation status post mechanical AVR: Remains on warfarin, followed by Coumadin clinic.  Continue aspirin as outlined above along with Protonix.  SBE prophylaxis indicated for all dental procedures.  Normal functioning valve prosthesis by echo in 07/2022.  HTN: Blood pressure is well-controlled in the office today.  Remains on Toprol-XL as outlined above.  Tobacco use: Has tapered tobacco use to around 1/2 pack daily.  Complete cessation is encouraged.    Disposition: F/u with Dr. Okey Dupre or an APP in 6 months.   Medication Adjustments/Labs and Tests Ordered: Current medicines are reviewed at length with the patient today.  Concerns regarding medicines are outlined above. Medication changes, Labs and Tests ordered today are summarized above  and listed in the Patient Instructions accessible in Encounters.   SignedEula Listen, PA-C 05/20/2023 10:03 AM      [facility] [place] [address] [place], [place] [address] [phone]

## 2023-05-20 NOTE — Patient Instructions (Signed)
 Medication Instructions:  Your Physician recommend you continue on your current medication as directed.    *If you need a refill on your cardiac medications before your next appointment, please call your pharmacy*   Lab Work: None ordered at this time    Follow-Up: At [facility], you and your health needs are our priority.  As part of our continuing mission to provide you with exceptional heart care, we have created designated Provider Care Teams.  These Care Teams include your primary Cardiologist (physician) and Advanced Practice Providers (APPs -  Physician Assistants and Nurse Practitioners) who all work together to provide you with the care you need, when you need it.  We recommend signing up for the patient portal called "MyChart".  Sign up information is provided on this After Visit Summary.  MyChart is used to connect with patients for Virtual Visits (Telemedicine).  Patients are able to view lab/test results, encounter notes, upcoming appointments, etc.  Non-urgent messages can be sent to your provider as well.   To learn more about what you can do with MyChart, go to [email or web address].    Your next appointment:   6 month(s)  Provider:   You may see Yvonne Kendall, MD or one of the following Advanced Practice Providers on your designated Care Team:   Eula Listen, [place]

## 2023-06-08 ENCOUNTER — Ambulatory Visit: Payer: Medicare Other | Attending: Internal Medicine

## 2023-06-08 DIAGNOSIS — Z5181 Encounter for therapeutic drug level monitoring: Secondary | ICD-10-CM | POA: Diagnosis not present

## 2023-06-08 DIAGNOSIS — Z952 Presence of prosthetic heart valve: Secondary | ICD-10-CM

## 2023-06-08 LAB — POCT INR: INR: 1.9 — AB (ref 2.0–3.0)

## 2023-06-08 NOTE — Patient Instructions (Signed)
 Take 2 tablets today only then Increase to 1 tablet daily, except 1.5 tablets every Monday, Wednesday and Friday Recheck in 4  weeks.  Call with new medications or bleeding problems [facility] [phone] or [place] office [phone].

## 2023-06-11 DIAGNOSIS — R52 Pain, unspecified: Secondary | ICD-10-CM | POA: Diagnosis not present

## 2023-06-11 DIAGNOSIS — Z79899 Other long term (current) drug therapy: Secondary | ICD-10-CM | POA: Diagnosis not present

## 2023-06-14 ENCOUNTER — Encounter: Payer: Self-pay | Admitting: Internal Medicine

## 2023-06-14 ENCOUNTER — Ambulatory Visit (INDEPENDENT_AMBULATORY_CARE_PROVIDER_SITE_OTHER): Payer: Medicare Other | Admitting: Internal Medicine

## 2023-06-14 VITALS — BP 110/62 | Ht 72.0 in | Wt 142.0 lb

## 2023-06-14 DIAGNOSIS — Z90410 Acquired total absence of pancreas: Secondary | ICD-10-CM

## 2023-06-14 DIAGNOSIS — G894 Chronic pain syndrome: Secondary | ICD-10-CM

## 2023-06-14 DIAGNOSIS — E1165 Type 2 diabetes mellitus with hyperglycemia: Secondary | ICD-10-CM

## 2023-06-14 DIAGNOSIS — I700 Atherosclerosis of aorta: Secondary | ICD-10-CM

## 2023-06-14 DIAGNOSIS — Z9081 Acquired absence of spleen: Secondary | ICD-10-CM

## 2023-06-14 DIAGNOSIS — Z952 Presence of prosthetic heart valve: Secondary | ICD-10-CM

## 2023-06-14 DIAGNOSIS — I2510 Atherosclerotic heart disease of native coronary artery without angina pectoris: Secondary | ICD-10-CM | POA: Diagnosis not present

## 2023-06-14 DIAGNOSIS — Z794 Long term (current) use of insulin: Secondary | ICD-10-CM | POA: Diagnosis not present

## 2023-06-14 DIAGNOSIS — K219 Gastro-esophageal reflux disease without esophagitis: Secondary | ICD-10-CM | POA: Insufficient documentation

## 2023-06-14 DIAGNOSIS — I252 Old myocardial infarction: Secondary | ICD-10-CM

## 2023-06-14 MED ORDER — GVOKE HYPOPEN 2-PACK 1 MG/0.2ML ~~LOC~~ SOAJ: 1.0000 mg | SUBCUTANEOUS | 0 refills | Status: DC | PRN

## 2023-06-14 NOTE — Assessment & Plan Note (Signed)
He will continue to follow with [facility]

## 2023-06-14 NOTE — Patient Instructions (Signed)
 GERD in Adults: Diet Changes When you have gastroesophageal reflux disease (GERD), you may need to make changes to your diet. Choosing the right foods can help with your symptoms. Think about working with an expert in healthy eating called a dietitian. They can help you make healthy food choices. What are tips for following this plan? Reading food labels Look for foods that are low in saturated fat. Foods that may help with your symptoms include: Foods with less than 5% of daily value (DV) of fat. Foods with 0 grams of trans fat. Cooking [employer] in ways that don't use a lot of fat. These ways include: Baking. Steaming. Grilling. Broiling. To add flavor, try to use herbs that are low in spice and acidity. Avoid frying your food. Meal planning  Eat small meals often rather than eating 3 large meals each day. Eat your meals slowly in a place where you feel relaxed. If told by your health care provider, avoid: Foods that cause symptoms. Keep a food diary to keep track of foods that cause symptoms. Alcohol. Drinking a lot of liquid with meals. General instructions For 2-3 hours after you eat, avoid: Bending over. Exercise. Lying down. Chew sugar-free gum after meals. What foods should I eat? Eat a healthy diet. Try to include: Foods with high amounts of fiber. These include: Fruits and vegetables. Whole grains and beans. Low-fat dairy products. Lean meats, fish, and poultry. Egg whites. Foods that cause symptoms in someone else may not cause symptoms for you. Work with your provider to find foods that are safe for you. The items listed above may not be all the foods and drinks you can have. Talk with a dietitian to learn more. The items listed above may not be a complete list of foods and beverages you can eat and drink. Contact a dietitian for more information. What foods should I avoid? Limiting some of these foods may help with your symptoms. Each person is different.  Talk with a dietitian or your provider to help you find the exact foods to avoid. Some of the foods to avoid may include: Fruits Fruits with a lot of acid in them. These may include citrus fruits, such as oranges, grapefruit, pineapple, and lemons. Vegetables Deep-fried vegetables, such as [place] fries. Vegetables, sauces, or toppings made with added fat and vegetables with acid in them. These may include tomatoes and tomato products, chili peppers, onions, garlic, and horseradish. Grains Pastries or quick breads with added fat. Meats and other proteins High-fat meats, such as fatty beef or pork, hot dogs, ribs, ham, sausage, salami, and bacon. Fried meat or protein, such as fried fish and fried chicken. Egg yolks. Fats and oils Butter. Margarine. Shortening. Ghee. Drinks Coffee and other drinks with caffeine in them. Fizzy and sugary drinks, such as soda and energy drinks. Fruit juice made with acidic fruits, such as orange or grapefruit. Tomato juice. Sweets and desserts Chocolate and cocoa. Donuts. Seasonings and condiments Mint, such as peppermint and spearmint. Condiments, herbs, or seasonings that cause symptoms. These may include curry, hot sauce, or vinegar-based salad dressings. The items listed above may not be all the foods and drinks you should avoid. Talk with a dietitian to learn more. Questions to ask your health care provider Changes to your diet and everyday life are often the first steps taken to manage symptoms of GERD. If these changes don't help, talk with your provider about taking medicines. Where to find more information International Foundation for Gastrointestinal Disorders:  aboutgerd.org This information is not intended to replace advice given to you by your health care provider. Make sure you discuss any questions you have with your health care provider. Document Revised: 01/25/2023 Document Reviewed: 08/11/2022 Elsevier Patient Education  2024 [employer].

## 2023-06-14 NOTE — Assessment & Plan Note (Signed)
 Lipid profile reviewed Encouraged him to consume a low-fat diet Continue atorvastatin, aspirin and coumadin

## 2023-06-14 NOTE — Assessment & Plan Note (Addendum)
Continue coumadin.  

## 2023-06-14 NOTE — Assessment & Plan Note (Signed)
 Continue uboxone

## 2023-06-14 NOTE — Assessment & Plan Note (Signed)
 Lipid profile reviewed Encouraged him to consume a low-fat diet Continue atorvastatin, aspirin, metoprolol and Coumadin

## 2023-06-14 NOTE — Assessment & Plan Note (Signed)
 Recent A1c and urine microalbumin reviewed Encouraged consume a low-carb diet Continue humalog and lantus Encourage routine eye exam Encouraged routine foot exam He declined flu shot Pneumovax UTD

## 2023-06-14 NOTE — Assessment & Plan Note (Signed)
 Continue creon He will continue to follow with [facility]

## 2023-06-14 NOTE — Assessment & Plan Note (Signed)
 Lipid profile reviewed Encouraged him to consume a low-fat diet Continue atorvastatin, metoprolol, aspirin and coumadin

## 2023-06-14 NOTE — Progress Notes (Signed)
 Subjective:    Patient ID: Ian Gross, male    DOB: 1975-10-12, 48 y.o.   MRN: [id number]  HPI  Patient presents to clinic today for follow-up of chronic conditions.  DM2: His last A1c was 8.5%, 03/2023.  He is taking lantus and humalog as prescribed but is considering getting a pump.  He sugars range 55-400.  He checks his feet intermittently.  His last eye exam was 12/2022.  Flu never.  Pneumovax 04/2019.  COVID never.  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  He follows with the suboxone clinic.  History of pancreatectomy, splenectomy: He takes creon as prescribed.  He follows with [facility].  HLD with aortic atherosclerosis/CAD status post MI, aortic valve replacement: His last LDL was 10, triglycerides 39, 11/2022.  He denies myalgias on atorvastatin.  He is taking metoprolol, aspirin and coumadin as prescribed.  He follows with cardiology.  GERD: Triggered by aspirin.  He denies breakthrough on pantoprazole.  There is no upper GI on file.  Review of Systems     Past Medical History:  Diagnosis Date   Alcoholic pancreatitis    s/p pancreatectomy   Diabetes mellitus with complication (HCC)    H/O splenectomy     Current Outpatient Medications  Medication Sig Dispense Refill   aspirin EC 81 MG tablet Take 1 tablet (81 mg total) by mouth daily. Swallow whole.     atorvastatin (LIPITOR) 80 MG tablet TAKE 1 TABLET BY MOUTH EVERY DAY 90 tablet 0   Buprenorphine HCl-Naloxone HCl 8-2 MG FILM Place 1 Film under the tongue 2 (two) times daily.     cephALEXin (KEFLEX) 500 MG capsule Take 1 capsule (500 mg total) by mouth 3 (three) times daily. (Patient not taking: Reported on 05/20/2023) 30 capsule 0   HUMALOG KWIKPEN 100 UNIT/ML KwikPen Inject 4-9 Units into the skin 3 (three) times daily. Sliding scale (go up by 1 unit)     insulin glargine (LANTUS) 100 UNIT/ML injection Inject 10 Units into the skin at bedtime.     methocarbamol (ROBAXIN) 500 MG tablet Take 1  tablet (500 mg total) by mouth every 8 (eight) hours as needed for muscle spasms. (Patient not taking: Reported on 05/20/2023) 90 tablet 0   metoprolol succinate (TOPROL XL) 25 MG 24 hr tablet Take 0.5 tablets (12.5 mg total) by mouth daily. 45 tablet 3   Pancrelipase, Lip-Prot-Amyl, (CREON) 24000-76000 units CPEP Take 3 capsules (72,000 Units total) by mouth 3 (three) times daily. 810 capsule 1   pantoprazole (PROTONIX) 40 MG tablet Take 1 tablet (40 mg total) by mouth daily. 30 tablet 11   warfarin (COUMADIN) 2.5 MG tablet TAKE 1 TO 2 TABLETS BY MOUTH DAILY OR AS DIRECTED BY COUMADIN CLINIC 100 tablet 0   No current facility-administered medications for this visit.    No Known Allergies  Family History  Problem Relation Age of Onset   Lung cancer Father    Healthy Sister    Healthy Brother    CVA Maternal Grandmother    Liver cancer Maternal Grandfather    Alcohol abuse Maternal Grandfather     Social History   Socioeconomic History   Marital status: Single    Spouse name: Not on file   Number of children: 2   Years of education: Not on file   Highest education level: Not on file  Occupational History   Occupation: Disable  Tobacco Use   Smoking status: Some Days    Current packs/day: 0.75  Types: Cigarettes   Smokeless tobacco: Never  Vaping Use   Vaping status: Never Used  Substance and Sexual Activity   Alcohol use: Not Currently   Drug use: Not Currently   Sexual activity: Not on file  Other Topics Concern   Not on file  Social History Narrative   Not on file   Social Drivers of Health   Financial Resource Strain: Low Risk  (09/03/2021)   Overall Financial Resource Strain (CARDIA)    Difficulty of Paying Living Expenses: Not hard at all  Food Insecurity: No Food Insecurity (07/07/2022)   Hunger Vital Sign    Worried About Running Out of Food in the Last Year: Never true    Ran Out of Food in the Last Year: Never true  Transportation Needs: No Transportation  Needs (07/07/2022)   PRAPARE - Administrator, Civil Service (Medical): No    Lack of Transportation (Non-Medical): No  Physical Activity: Not on file  Stress: No Stress Concern Present (09/03/2021)   [employer] of Occupational Health - Occupational Stress Questionnaire    Feeling of Stress : Only a little  Social Connections: Moderately Isolated (09/03/2021)   Social Connection and Isolation Panel [NHANES]    Frequency of Communication with Friends and Family: Twice a week    Frequency of Social Gatherings with Friends and Family: More than three times a week    Attends Religious Services: Never    Database administrator or Organizations: No    Attends Engineer, structural: Not on file    Marital Status: Living with partner  Intimate Partner Violence: Not At Risk (09/03/2021)   Humiliation, Afraid, Rape, and Kick questionnaire    Fear of Current or Ex-Partner: No    Emotionally Abused: No    Physically Abused: No    Sexually Abused: No     Constitutional: Denies fever, malaise, fatigue, headache or abrupt weight changes.  HEENT: Denies eye pain, eye redness, ear pain, ringing in the ears, wax buildup, runny nose, nasal congestion, bloody nose, or sore throat. Respiratory: Denies difficulty breathing, shortness of breath, cough or sputum production.   Cardiovascular: Denies chest pain, chest tightness, palpitations or swelling in the hands or feet.  Gastrointestinal: Denies abdominal pain, bloating, constipation, diarrhea or blood in the stool.  GU: Denies urgency, frequency, pain with urination, burning sensation, blood in urine, odor or discharge. Musculoskeletal: Denies decrease in range of motion, difficulty with gait, muscle pain or joint pain and swelling.  Skin: Denies redness, rashes, lesions or ulcercations.  Neurological: Denies dizziness, difficulty with memory, difficulty with speech or problems with balance and coordination.  Psych: Denies anxiety,  depression, SI/HI.  No other specific complaints in a complete review of systems (except as listed in HPI above).  Objective:   Physical Exam  BP 110/62 (BP Location: Right Arm, Patient Position: Sitting, Cuff Size: Normal)   Ht 6' (1.829 m)   Wt 142 lb (64.4 kg)   BMI 19.26 kg/m    Wt Readings from Last 3 Encounters:  05/20/23 144 lb 3.2 oz (65.4 kg)  03/14/23 139 lb 3.2 oz (63.1 kg)  01/24/23 129 lb (58.5 kg)    General: Appears his stated age, in NAD. Skin: Warm, dry and intact.  Slight ruddy discoloration to BLE.  No ulcerations noted. HEENT: Head: normal shape and size; Eyes: sclera white, no icterus, conjunctiva pink, PERRLA and EOMs intact;  Cardiovascular: Normal rate and rhythm.  Click noted.  No murmur, rubs  or gallops noted. No JVD or BLE edema. No carotid bruits noted. Pulmonary/Chest: Normal effort and positive vesicular breath sounds. No respiratory distress. No wheezes, rales or ronchi noted.  Abdomen: Soft and nontender. Normal bowel sounds.  Musculoskeletal:  No difficulty with gait.  Neurological: Alert and oriented. Coordination normal.    BMET    Component Value Date/Time   NA 139 03/14/2023 1518   K 5.0 03/14/2023 1518   CL 102 03/14/2023 1518   CO2 23 03/14/2023 1518   GLUCOSE 264 (H) 03/14/2023 1518   GLUCOSE 220 (H) 12/14/2022 0934   BUN 13 03/14/2023 1518   CREATININE 1.10 03/14/2023 1518   CREATININE 1.18 12/14/2022 0934   CALCIUM 8.7 03/14/2023 1518   GFRNONAA >60 07/01/2022 0045   GFRAA >60 08/01/2019 0509    Lipid Panel     Component Value Date/Time   CHOL 51 12/14/2022 0934   TRIG 39 12/14/2022 0934   HDL 30 (L) 12/14/2022 0934   CHOLHDL 1.7 12/14/2022 0934   VLDL 12 06/15/2022 1553   LDLCALC 10 12/14/2022 0934    CBC    Component Value Date/Time   WBC 10.2 03/14/2023 1518   WBC 11.2 (H) 12/14/2022 0934   RBC 4.16 03/14/2023 1518   RBC 4.22 12/14/2022 0934   HGB 13.6 03/14/2023 1518   HCT 40.5 03/14/2023 1518   PLT 256  03/14/2023 1518   MCV 97 03/14/2023 1518   MCH 32.7 03/14/2023 1518   MCH 31.3 12/14/2022 0934   MCHC 33.6 03/14/2023 1518   MCHC 33.2 12/14/2022 0934   RDW 12.5 03/14/2023 1518   LYMPHSABS 3.4 06/15/2022 1553   MONOABS 1.3 (H) 06/15/2022 1553   EOSABS 0.3 06/15/2022 1553   BASOSABS 0.1 06/15/2022 1553    Hgb A1C Lab Results  Component Value Date   HGBA1C 8.5 04/20/2023           Assessment & Plan:      RTC in 6 months for your annual exam Nicki Reaper, NP

## 2023-06-14 NOTE — Assessment & Plan Note (Signed)
 Continue pantoprazole.

## 2023-06-19 ENCOUNTER — Other Ambulatory Visit: Payer: Self-pay | Admitting: Internal Medicine

## 2023-06-19 DIAGNOSIS — Z952 Presence of prosthetic heart valve: Secondary | ICD-10-CM

## 2023-06-19 DIAGNOSIS — Z7901 Long term (current) use of anticoagulants: Secondary | ICD-10-CM

## 2023-06-19 DIAGNOSIS — Z5181 Encounter for therapeutic drug level monitoring: Secondary | ICD-10-CM

## 2023-06-20 NOTE — Telephone Encounter (Signed)
 Prescription refill request received for warfarin Lov: 05/20/23 Ian Gross)  Next INR check: 07/06/23 Warfarin tablet strength: 2.5mg   Appropriate dose. Refill sent.

## 2023-07-06 ENCOUNTER — Ambulatory Visit: Attending: Internal Medicine

## 2023-07-06 DIAGNOSIS — Z5181 Encounter for therapeutic drug level monitoring: Secondary | ICD-10-CM

## 2023-07-06 DIAGNOSIS — Z952 Presence of prosthetic heart valve: Secondary | ICD-10-CM

## 2023-07-06 LAB — POCT INR: INR: 2 (ref 2.0–3.0)

## 2023-07-06 NOTE — Patient Instructions (Signed)
 Continue 1 tablet daily, except 1.5 tablets every Monday, Wednesday and Friday Recheck in 6  weeks.  Call with new medications or bleeding problems [facility] [phone] or [place] office [phone].

## 2023-07-12 ENCOUNTER — Other Ambulatory Visit: Payer: Self-pay | Admitting: Cardiology

## 2023-08-17 ENCOUNTER — Ambulatory Visit: Attending: Internal Medicine

## 2023-08-17 DIAGNOSIS — Z5181 Encounter for therapeutic drug level monitoring: Secondary | ICD-10-CM

## 2023-08-17 DIAGNOSIS — Z952 Presence of prosthetic heart valve: Secondary | ICD-10-CM | POA: Diagnosis not present

## 2023-08-17 LAB — POCT INR: INR: 2.3 (ref 2.0–3.0)

## 2023-08-17 NOTE — Patient Instructions (Signed)
 Description   Continue 1 tablet daily, except 1.5 tablets every Monday, Wednesday and Friday Recheck in 6 weeks.  Call with new medications or bleeding problems [facility] [phone] or [place] office [phone].

## 2023-09-23 ENCOUNTER — Other Ambulatory Visit: Payer: Self-pay | Admitting: Internal Medicine

## 2023-09-23 DIAGNOSIS — Z952 Presence of prosthetic heart valve: Secondary | ICD-10-CM

## 2023-09-23 DIAGNOSIS — Z5181 Encounter for therapeutic drug level monitoring: Secondary | ICD-10-CM

## 2023-09-23 DIAGNOSIS — Z7901 Long term (current) use of anticoagulants: Secondary | ICD-10-CM

## 2023-09-28 ENCOUNTER — Ambulatory Visit

## 2023-10-05 ENCOUNTER — Ambulatory Visit: Attending: Internal Medicine

## 2023-10-05 DIAGNOSIS — Z952 Presence of prosthetic heart valve: Secondary | ICD-10-CM

## 2023-10-05 DIAGNOSIS — Z5181 Encounter for therapeutic drug level monitoring: Secondary | ICD-10-CM | POA: Diagnosis not present

## 2023-10-05 LAB — POCT INR: INR: 2.9 (ref 2.0–3.0)

## 2023-10-05 NOTE — Patient Instructions (Signed)
 Description   Continue 1 tablet daily, except 1.5 tablets every Monday, Wednesday and Friday Recheck in 6 weeks.  Call with new medications or bleeding problems [facility] [phone] or [place] office [phone].

## 2023-10-10 ENCOUNTER — Other Ambulatory Visit: Payer: Self-pay | Admitting: Physician Assistant

## 2023-11-16 ENCOUNTER — Ambulatory Visit: Attending: Internal Medicine

## 2023-11-16 DIAGNOSIS — Z952 Presence of prosthetic heart valve: Secondary | ICD-10-CM

## 2023-11-16 DIAGNOSIS — Z5181 Encounter for therapeutic drug level monitoring: Secondary | ICD-10-CM | POA: Diagnosis not present

## 2023-11-16 LAB — POCT INR: INR: 3.1 — AB (ref 2.0–3.0)

## 2023-11-16 NOTE — Patient Instructions (Signed)
 Continue 1 tablet daily, except 1.5 tablets every Monday, Wednesday and Friday Recheck in 6  weeks.  Call with new medications or bleeding problems [facility] [phone] or [place] office [phone].

## 2023-11-22 ENCOUNTER — Ambulatory Visit: Attending: Physician Assistant | Admitting: Physician Assistant

## 2023-11-22 ENCOUNTER — Encounter: Payer: Self-pay | Admitting: Physician Assistant

## 2023-11-22 VITALS — BP 110/60 | HR 60 | Ht 72.0 in | Wt 137.4 lb

## 2023-11-22 DIAGNOSIS — Z720 Tobacco use: Secondary | ICD-10-CM

## 2023-11-22 DIAGNOSIS — I5022 Chronic systolic (congestive) heart failure: Secondary | ICD-10-CM

## 2023-11-22 DIAGNOSIS — I2510 Atherosclerotic heart disease of native coronary artery without angina pectoris: Secondary | ICD-10-CM

## 2023-11-22 DIAGNOSIS — I351 Nonrheumatic aortic (valve) insufficiency: Secondary | ICD-10-CM

## 2023-11-22 DIAGNOSIS — Z7901 Long term (current) use of anticoagulants: Secondary | ICD-10-CM

## 2023-11-22 DIAGNOSIS — I255 Ischemic cardiomyopathy: Secondary | ICD-10-CM | POA: Diagnosis not present

## 2023-11-22 DIAGNOSIS — Z951 Presence of aortocoronary bypass graft: Secondary | ICD-10-CM | POA: Diagnosis not present

## 2023-11-22 DIAGNOSIS — Z952 Presence of prosthetic heart valve: Secondary | ICD-10-CM

## 2023-11-22 DIAGNOSIS — I10 Essential (primary) hypertension: Secondary | ICD-10-CM

## 2023-11-22 NOTE — Progress Notes (Signed)
 Cardiology Office Note    Date:  11/22/2023   ID:  Ian Gross, DOB 11-Sep-1975, MRN [id number]  PCP:  Antonette Angeline ORN, NP  Cardiologist:  Lonni Hanson, MD  Electrophysiologist:  None   Chief Complaint: Follow-up  History of Present Illness:   Ian Gross is a 48 y.o. 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   He was admitted to the hospital in 05/2022 with NSTEMI.  LHC showed severe proximal LAD disease along with moderate D1 stenosis extending back into the ostium.  The left main, ramus, LCx, and RCA were without significant disease.  PCI was felt to be too high risk with recommendation for CABG.  LVEF 45 to 50% with normal LVEDP.  Echo showed an EF of 40%, mid to apical anteroseptal and inferoseptal akinesis and anterior hypokinesis, mildly dilated LV internal cavity size, normal LV diastolic function parameters, normal RV systolic function and ventricular cavity size, moderate to severe aortic regurgitation, and an estimated right atrial pressure of 8 mmHg.  He was also noted to have poor dentition with dental consultation leading to multiple extractions prior to cardiothoracic surgery.  He underwent two-vessel CABG with LIMA to LAD and SVG to diagonal with mechanical AVR in 06/2022.  Post-operative course was notable for transfusion dependent anemia as well as hypotension and postoperative pain.  Follow-up echo on 08/09/2022 demonstrated an EF of 40 to 45%, mild LVH, severe hypokinesis of the mid to apical anteroseptal and anterior wall, normal RV systolic function and ventricular cavity size, and normal structure and function of aortic  valve prosthesis.  In follow-up visits, relative hypotension has precluded further escalation of GDMT.  He has continued to smoke.  Limited echo on 05/04/2023 showed an EF of 40 to 45%, anterior, mid to distal anteroseptal, and apical hypokinesis, mildly dilated LV internal cavity size, normal LV diastolic function, normal RV systolic function and ventricular cavity size, mild mitral regurgitation, and an estimated right atrial pressure of 3 mmHg.  In follow-up visits, relative hypotension and fatigue have limited escalation of pharmacotherapy.  He was last seen in the office in 04/2023 and felt like he was back to baseline.  No changes in cardiac pharmacotherapy were pursued at that time.  He comes in continuing to do well from a cardiac perspective and is without symptoms of angina or cardiac decompensation.  No palpitations, dizziness, presyncope, or syncope.  No falls or symptoms concerning for bleeding.  No lower extremity swelling.  He does notice some intermittent random recurring bilateral flank spasms.  Otherwise, he is without cardiac complaints.  Continues to smoke a half a pack per day.  Hopeful to taper down to 5 cigarettes/day moving forward.  Does not request assistance with tobacco use cessation at this time.   Labs independently reviewed: 10/2023 - INR 3.1 07/2023 - A1c 9.3 02/2023 - Hgb 13.6, PLT 256, BUN 13, serum creatinine 1.1, potassium 5.0 11/2022 - TC 51, TG 39, HDL 30, LDL 10, albumin  3.3, AST/ALT normal 05/2022 - TSH normal  Past Medical History:  Diagnosis Date   Alcoholic pancreatitis    s/p pancreatectomy   Diabetes mellitus with complication (HCC)    H/O splenectomy     Past Surgical  History:  Procedure Laterality Date   AORTIC VALVE REPLACEMENT N/A 06/28/2022   Procedure: AORTIC VALVE REPLACEMENT (AVR) USING ON-X PROSTHETIC HEART VALVE SIZE ;  Surgeon: Shyrl Linnie KIDD, MD;  Location: [facility] OR;  Service: Open Heart Surgery;  Laterality: N/A;   CORONARY ARTERY BYPASS  GRAFT N/A 06/28/2022   Procedure: CORONARY ARTERY BYPASS GRAFTING (CABG) X2 USING LEFT INTERNAL MAMMARY ARTERY AND RIGHT GREATER SAPHENOUS VEIN HARVESTED ENDOSCOPICALLY;  Surgeon: Shyrl Linnie KIDD, MD;  Location: [facility] OR;  Service: Open Heart Surgery;  Laterality: N/A;   DENTAL RESTORATION/EXTRACTION WITH X-RAY N/A 06/23/2022   Procedure: DENTAL RESTORATION/EXTRACTION WITH POSSIBLE X-RAY;  Surgeon: Sheryle Hamilton, DMD;  Location: [facility] OR;  Service: Oral Surgery;  Laterality: N/A;   LEFT HEART CATH AND CORONARY ANGIOGRAPHY N/A 06/15/2022   Procedure: LEFT HEART CATH AND CORONARY ANGIOGRAPHY;  Surgeon: Mady Bruckner, MD;  Location: [facility] INVASIVE CV LAB;  Service: Cardiovascular;  Laterality: N/A;   PANCREATECTOMY N/A    SPLENECTOMY     TEE WITHOUT CARDIOVERSION N/A 06/28/2022   Procedure: TRANSESOPHAGEAL ECHOCARDIOGRAM;  Surgeon: Shyrl Linnie KIDD, MD;  Location: [facility] OR;  Service: Open Heart Surgery;  Laterality: N/A;    Current Medications: Current Meds  Medication Sig   aspirin  EC 81 MG tablet Take 1 tablet (81 mg total) by mouth daily. Swallow whole.   atorvastatin  (LIPITOR ) 80 MG tablet TAKE 1 TABLET BY MOUTH EVERY DAY   Buprenorphine  HCl-Naloxone  HCl 8-2 MG FILM Place 1 Film under the tongue 2 (two) times daily.   GVOKE HYPOPEN  2-PACK 1 MG/0.2ML SOAJ Inject 1 mg into the skin as needed (hypoglycemia).   HUMALOG KWIKPEN 100 UNIT/ML KwikPen Inject 4-9 Units into the skin 3 (three) times daily. Sliding scale (go up by 1 unit)   insulin  glargine (LANTUS ) 100 UNIT/ML injection Inject 10 Units into the skin at bedtime.   metoprolol  succinate (TOPROL -XL) 25 MG 24 hr tablet TAKE 1/2 TABLET BY MOUTH EVERY DAY   Pancrelipase , Lip-Prot-Amyl, (CREON ) 24000-76000 units CPEP Take 3 capsules (72,000 Units total) by mouth 3 (three) times daily.   pantoprazole  (PROTONIX ) 40 MG tablet TAKE 1 TABLET BY MOUTH EVERY DAY   warfarin (COUMADIN ) 2.5 MG tablet TAKE 1 TO 2 TABLETS BY MOUTH DAILY OR AS DIRECTED BY  COUMADIN  CLINIC    Allergies:   Patient has no known allergies.   Social History   Socioeconomic History   Marital status: Single    Spouse name: Not on file   Number of children: 2   Years of education: Not on file   Highest education level: Not on file  Occupational History   Occupation: Disable  Tobacco Use   Smoking status: Some Days    Current packs/day: 0.75    Types: Cigarettes   Smokeless tobacco: Never  Vaping Use   Vaping status: Never Used  Substance and Sexual Activity   Alcohol use: Not Currently   Drug use: Not Currently   Sexual activity: Not on file  Other Topics Concern   Not on file  Social History Narrative   Not on file   Social Drivers of Health   Financial Resource Strain: Low Risk  (09/03/2021)   Overall Financial Resource Strain (CARDIA)    Difficulty of Paying Living Expenses: Not hard at all  Food Insecurity: No Food Insecurity (07/07/2022)   Hunger Vital Sign    Worried About Running Out of Food in the Last Year: Never true    Ran Out of Food in the Last Year: Never  true  Transportation Needs: No Transportation Needs (07/07/2022)   PRAPARE - Administrator, Civil Service (Medical): No    Lack of Transportation (Non-Medical): No  Physical Activity: Not on file  Stress: No Stress Concern Present (09/03/2021)   [employer] of Occupational Health - Occupational Stress Questionnaire    Feeling of Stress : Only a little  Social Connections: Moderately Isolated (09/03/2021)   Social Connection and Isolation Panel    Frequency of Communication with Friends and Family: Twice a week    Frequency of Social Gatherings with Friends and Family: More than three times a week    Attends Religious Services: Never    Database administrator or Organizations: No    Attends Engineer, structural: Not on file    Marital Status: Living with partner     Family History:  The patient's family history includes Alcohol abuse in his maternal  grandfather; CVA in his maternal grandmother; Healthy in his brother and sister; Liver cancer in his maternal grandfather; Lung cancer in his father.  ROS:   12-point review of systems is negative unless otherwise noted in the HPI.   EKGs/Labs/Other Studies Reviewed:    Studies reviewed were summarized above. The additional studies were reviewed today:  Limited echo 05/04/2023: 1. Left ventricular ejection fraction, by estimation, is 40 to 45%. Left  ventricular ejection fraction by PLAX is 48 %. The left ventricle has  mildly decreased function. The left ventricle demonstrates regional wall  motion abnormalities (anterior, mid  to distal anteroseptal and apical hypokinesis). The left ventricular  internal cavity size was mildly dilated. Left ventricular diastolic  parameters were normal.   2. Right ventricular systolic function is normal. The right ventricular  size is normal. Tricuspid regurgitation signal is inadequate for assessing  PA pressure.   3. The mitral valve is normal in structure. Mild mitral valve  regurgitation. No evidence of mitral stenosis.   4. The aortic valve is normal in structure. Aortic valve regurgitation is  not visualized. No aortic stenosis is present.   5. The inferior vena cava is normal in size with greater than 50%  respiratory variability, suggesting right atrial pressure of 3 mmHg.  __________   2D echo 08/09/2022: 1. Left ventricular ejection fraction, by estimation, is 40 to 45%. The  left ventricle has mildly decreased function. The left ventricle  demonstrates regional wall motion abnormalities (see scoring  diagram/findings for description). There is mild left  ventricular hypertrophy. Left ventricular diastolic parameters were  normal. There is severe hypokinesis of the left ventricular, mid-apical  anteroseptal wall and anterior wall.   2. Right ventricular systolic function is normal. The right ventricular  size is normal. Tricuspid  regurgitation signal is inadequate for assessing  PA pressure.   3. The mitral valve is normal in structure. No evidence of mitral valve  regurgitation. No evidence of mitral stenosis.   4. The aortic valve has been repaired/replaced. Aortic valve  regurgitation is not visualized. No aortic stenosis is present. Echo  findings are consistent with normal structure and function of the aortic  valve prosthesis.  __________   TEE 06/28/2022: POST-OP IMPRESSIONS  _ Aortic Valve: No stenosis present. A bileaflet mechanical valve was  placed,  leaflets are freely mobile. There is no regurgitation. Normal washing jets  for  valve type. No perivalvular leak noted.  _ Comments: Aortic valve replaced with normal function. Lv function  improved.  Exam otherwise unchanged.  __________   [facility]  vascular ultrasound 06/20/2022: Summary:  Right Carotid: Velocities in the right ICA are consistent with a 1-39%  stenosis.   Left Carotid: Velocities in the left ICA are consistent with a 1-39%  stenosis.  Vertebrals: Bilateral vertebral arteries demonstrate antegrade flow.  Subclavians: Normal flow hemodynamics were seen in bilateral subclavian               arteries.   Right ABI: Resting right ankle-brachial index is within normal range. The  right toe-brachial index is abnormal.  Left ABI: Resting left ankle-brachial index indicates moderate left lower  extremity arterial disease. The left toe-brachial index is abnormal.  Right Upper Extremity: Doppler waveforms decrease <50% with right radial  compression. Doppler waveform obliterate with right ulnar compression.  Left Upper Extremity: Doppler waveform obliterate with left radial  compression. Doppler waveform obliterate with left ulnar compression.  __________   2D echo 06/16/2022: 1. Left ventricular ejection fraction, by estimation, is 40%. The left  ventricle has mild to moderately decreased function. The left ventricle  demonstrates  regional wall motion abnormalities with mid to apical  anteroseptal and inferoseptal akinesis and  anterior hypokinesis. The left ventricular internal cavity size was mildly  dilated. Left ventricular diastolic parameters were normal.   2. Right ventricular systolic function is normal. The right ventricular  size is normal. Tricuspid regurgitation signal is inadequate for assessing  PA pressure.   3. The mitral valve is normal in structure. No evidence of mitral valve  regurgitation. No evidence of mitral stenosis.   4. The aortic valve is probaby tricuspid though not visualized ideally.  There is mild calcification of the aortic valve. Aortic valve  regurgitation is moderate to severe. There was no holodiastolic flow  reversal noted in the descending thoracic aorta.  No aortic stenosis is present.   5. The inferior vena cava is normal in size with <50% respiratory  variability, suggesting right atrial pressure of 8 mmHg.  __________   LHC 06/15/2022: Conclusions: Severe complex ostial/proximal LAD involving moderate-caliber D1 that is difficult to visualize due to vessel overlap.  Lesion appears to be an acute plaque rupture with up to 90% stenosis involving the LAD and 75% stenosis involving D1 leading to the patient's NSTEMI.  LMCA, ramus intermedius, LCx, and RCA are without significant disease. Mildly reduced left ventricular systolic function with mild mid and apical anterior and apical inferior hypokinesis (LVEF 45-50%). Normal left ventricular filling pressure (LVEDP 6 mmHg).   Recommendations: Case reviewed with Dr. Anner (interventional cardiology) and Dr. Shyrl (cardiothoracic surgery).  Given proximal LAD disease involving sizeable D1 and adjacent ramus intermedius branch, PCI would be high risk.  In the setting of his DM and LAD/D1 disease, we have agreed that CABG may be the best revascularization option.  Given that Mr. Ionescu is currently chest pain free and  hemodynamically stable, we will transfer him to [facility] for urgent evaluation and possible CABG as soon as tomorrow. Start IV heparin  2 hours after TR band deflation. High-intensity statin therapy. Aspirin  81 mg daily; defer P2Y12 inhibitor pending cardiac surgery evaluation. Start metoprolol  tartrate 12.5 mg twice daily.   EKG:  EKG is ordered today.  The EKG ordered today demonstrates NSR, 60 bpm, no acute ST-T changes  Recent Labs: 12/14/2022: ALT 29 03/14/2023: BUN 13; Creatinine, Ser 1.10; Hemoglobin 13.6; Platelets 256; Potassium 5.0; Sodium 139  Recent Lipid Panel    Component Value Date/Time   CHOL 51 12/14/2022 0934   TRIG 39 12/14/2022 0934   HDL  30 (L) 12/14/2022 0934   CHOLHDL 1.7 12/14/2022 0934   VLDL 12 06/15/2022 1553   LDLCALC 10 12/14/2022 0934    PHYSICAL EXAM:    VS:  BP 110/60 (BP Location: Left Arm, Patient Position: Sitting, Cuff Size: Normal)   Pulse 60   Ht 6' (1.829 m)   Wt 137 lb 6 oz (62.3 kg)   SpO2 98%   BMI 18.63 kg/m   BMI: Body mass index is 18.63 kg/m.  Physical Exam Vitals reviewed.  Constitutional:      Appearance: He is well-developed.  HENT:     Head: Normocephalic and atraumatic.  Eyes:     General:        Right eye: No discharge.        Left eye: No discharge.  Cardiovascular:     Rate and Rhythm: Normal rate and regular rhythm.     Pulses:          Posterior tibial pulses are 2+ on the right side and 2+ on the left side.     Heart sounds: S1 normal and S2 normal. Heart sounds not distant. No midsystolic click and no opening snap. Murmur heard.     Systolic murmur is present. Mechanical click noted.     No friction rub.  Pulmonary:     Effort: Pulmonary effort is normal. No respiratory distress.     Breath sounds: Normal breath sounds. No decreased breath sounds, wheezing, rhonchi or rales.  Musculoskeletal:     Cervical back: Normal range of motion.     Right lower leg: No edema.     Left lower leg: No edema.   Skin:    General: Skin is warm and dry.     Nails: There is no clubbing.  Neurological:     Mental Status: He is alert and oriented to person, place, and time.  Psychiatric:        Speech: Speech normal.        Behavior: Behavior normal.        Thought Content: Thought content normal.        Judgment: Judgment normal.     Wt Readings from Last 3 Encounters:  11/22/23 137 lb 6 oz (62.3 kg)  06/14/23 142 lb (64.4 kg)  05/20/23 144 lb 3.2 oz (65.4 kg)     ASSESSMENT & PLAN:   CAD status post CABG without angina: He continues to do well and is without symptoms of angina or cardiac decompensation.  LDL 10 in 11/2022.  Continue aggressive risk factor modification and secondary prevention including aspirin  81 mg and atorvastatin  80 mg.  No indication for further ischemic testing at this time.  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  Not requiring a standing loop diuretic.  Remains on Toprol -XL 12.5 mg daily.  Relative hypotension with positional dizziness has precluded escalation of GDMT.  Moderate to severe aortic regurgitation status post mechanical AVR: Normal structure and function by echo in 04/2023.  Remains on warfarin, followed by Coumadin  clinic.  Continue aspirin  as outlined above along with Protonix .  SBE prophylaxis is indicated for all dental procedures.  HTN: Blood pressure is well-controlled in the office today.  Remains on Toprol -XL 12.5 mg daily.  Tobacco use: Continues to smoke half pack per day.  Declines assistance at this time.  He is hopeful to taper down to 5 cigarettes/day followed by complete cessation thereafter.     Disposition: F/u with Dr. Mady  or an APP in 6 months.   Medication Adjustments/Labs and Tests Ordered: Current medicines are reviewed at length with the patient today.  Concerns regarding medicines are outlined above. Medication changes, Labs and Tests ordered today are summarized above and  listed in the Patient Instructions accessible in Encounters.   Signed, Bernardino Bring, PA-C 11/22/2023 9:28 AM     [facility] [place] [address] [place], [place] [address] [phone]

## 2023-11-22 NOTE — Patient Instructions (Signed)
 Medication Instructions:  Your physician recommends that you continue on your current medications as directed. Please refer to the Current Medication list given to you today.   *If you need a refill on your cardiac medications before your next appointment, please call your pharmacy*  Lab Work: None ordered at this time   Follow-Up: At [facility], you and your health needs are our priority.  As part of our continuing mission to provide you with exceptional heart care, our providers are all part of one team.  This team includes your primary Cardiologist (physician) and Advanced Practice Providers or APPs (Physician Assistants and Nurse Practitioners) who all work together to provide you with the care you need, when you need it.  Your next appointment:   6 month(s)  Provider:   You may see Lonni Hanson, MD or Bernardino Bring, PA-C  We recommend signing up for the patient portal called MyChart.  Sign up information is provided on this After Visit Summary.  MyChart is used to connect with patients for Virtual Visits (Telemedicine).  Patients are able to view lab/test results, encounter notes, upcoming appointments, etc.  Non-urgent messages can be sent to your provider as well.   To learn more about what you can do with MyChart, go to [email or web address].

## 2023-11-28 ENCOUNTER — Other Ambulatory Visit: Payer: Self-pay | Admitting: Internal Medicine

## 2023-11-28 DIAGNOSIS — Z952 Presence of prosthetic heart valve: Secondary | ICD-10-CM

## 2023-11-28 DIAGNOSIS — Z7901 Long term (current) use of anticoagulants: Secondary | ICD-10-CM

## 2023-11-28 DIAGNOSIS — Z5181 Encounter for therapeutic drug level monitoring: Secondary | ICD-10-CM

## 2023-11-30 LAB — HEMOGLOBIN A1C: Hemoglobin A1C: 8.5

## 2023-12-15 ENCOUNTER — Encounter: Payer: Self-pay | Admitting: Internal Medicine

## 2023-12-15 ENCOUNTER — Ambulatory Visit: Admitting: Internal Medicine

## 2023-12-15 VITALS — BP 112/64 | Ht 72.0 in | Wt 136.0 lb

## 2023-12-15 DIAGNOSIS — R636 Underweight: Secondary | ICD-10-CM

## 2023-12-15 DIAGNOSIS — Z125 Encounter for screening for malignant neoplasm of prostate: Secondary | ICD-10-CM

## 2023-12-15 DIAGNOSIS — Z794 Long term (current) use of insulin: Secondary | ICD-10-CM

## 2023-12-15 DIAGNOSIS — Z0001 Encounter for general adult medical examination with abnormal findings: Secondary | ICD-10-CM

## 2023-12-15 DIAGNOSIS — E1165 Type 2 diabetes mellitus with hyperglycemia: Secondary | ICD-10-CM | POA: Diagnosis not present

## 2023-12-15 DIAGNOSIS — Z681 Body mass index (BMI) 19 or less, adult: Secondary | ICD-10-CM

## 2023-12-15 LAB — COLOGUARD: COLOGUARD: NEGATIVE

## 2023-12-15 MED ORDER — CREON 24000-76000 UNITS PO CPEP: 3.0000 | ORAL_CAPSULE | Freq: Three times a day (TID) | ORAL | 1 refills | Status: AC

## 2023-12-15 NOTE — Patient Instructions (Signed)
 Health Maintenance, Male  Adopting a healthy lifestyle and getting preventive care are important in promoting health and wellness. Ask your health care provider about:  The right schedule for you to have regular tests and exams.  Things you can do on your own to prevent diseases and keep yourself healthy.  What should I know about diet, weight, and exercise?  Eat a healthy diet    Eat a diet that includes plenty of vegetables, fruits, low-fat dairy products, and lean protein.  Do not eat a lot of foods that are high in solid fats, added sugars, or sodium.  Maintain a healthy weight  Body mass index (BMI) is a measurement that can be used to identify possible weight problems. It estimates body fat based on height and weight. Your health care provider can help determine your BMI and help you achieve or maintain a healthy weight.  Get regular exercise  Get regular exercise. This is one of the most important things you can do for your health. Most adults should:  Exercise for at least 150 minutes each week. The exercise should increase your heart rate and make you sweat (moderate-intensity exercise).  Do strengthening exercises at least twice a week. This is in addition to the moderate-intensity exercise.  Spend less time sitting. Even light physical activity can be beneficial.  Watch cholesterol and blood lipids  Have your blood tested for lipids and cholesterol at 50 years of age, then have this test every 5 years.  You may need to have your cholesterol levels checked more often if:  Your lipid or cholesterol levels are high.  You are older than 50 years of age.  You are at high risk for heart disease.  What should I know about cancer screening?  Many types of cancers can be detected early and may often be prevented. Depending on your health history and family history, you may need to have cancer screening at various ages. This may include screening for:  Colorectal cancer.  Prostate cancer.  Skin cancer.  Lung  cancer.  What should I know about heart disease, diabetes, and high blood pressure?  Blood pressure and heart disease  High blood pressure causes heart disease and increases the risk of stroke. This is more likely to develop in people who have high blood pressure readings or are overweight.  Talk with your health care provider about your target blood pressure readings.  Have your blood pressure checked:  Every 3-5 years if you are 24-52 years of age.  Every year if you are 3 years old or older.  If you are between the ages of 60 and 72 and are a current or former smoker, ask your health care provider if you should have a one-time screening for abdominal aortic aneurysm (AAA).  Diabetes  Have regular diabetes screenings. This checks your fasting blood sugar level. Have the screening done:  Once every three years after age 66 if you are at a normal weight and have a low risk for diabetes.  More often and at a younger age if you are overweight or have a high risk for diabetes.  What should I know about preventing infection?  Hepatitis B  If you have a higher risk for hepatitis B, you should be screened for this virus. Talk with your health care provider to find out if you are at risk for hepatitis B infection.  Hepatitis C  Blood testing is recommended for:  Everyone born from [birth year] through 1967.  Anyone  with known risk factors for hepatitis C.  Sexually transmitted infections (STIs)  You should be screened each year for STIs, including gonorrhea and chlamydia, if:  You are sexually active and are younger than 50 years of age.  You are older than 50 years of age and your health care provider tells you that you are at risk for this type of infection.  Your sexual activity has changed since you were last screened, and you are at increased risk for chlamydia or gonorrhea. Ask your health care provider if you are at risk.  Ask your health care provider about whether you are at high risk for HIV. Your health care provider  may recommend a prescription medicine to help prevent HIV infection. If you choose to take medicine to prevent HIV, you should first get tested for HIV. You should then be tested every 3 months for as long as you are taking the medicine.  Follow these instructions at home:  Alcohol use  Do not drink alcohol if your health care provider tells you not to drink.  If you drink alcohol:  Limit how much you have to 0-2 drinks a day.  Know how much alcohol is in your drink. In the [place] one drink equals one 12 oz bottle of beer (355 mL), one 5 oz glass of wine (148 mL), or one 1 oz glass of hard liquor (44 mL).  Lifestyle  Do not use any products that contain nicotine or tobacco. These products include cigarettes, chewing tobacco, and vaping devices, such as e-cigarettes. If you need help quitting, ask your health care provider.  Do not use street drugs.  Do not share needles.  Ask your health care provider for help if you need support or information about quitting drugs.  General instructions  Schedule regular health, dental, and eye exams.  Stay current with your vaccines.  Tell your health care provider if:  You often feel depressed.  You have ever been abused or do not feel safe at home.  Summary  Adopting a healthy lifestyle and getting preventive care are important in promoting health and wellness.  Follow your health care provider's instructions about healthy diet, exercising, and getting tested or screened for diseases.  Follow your health care provider's instructions on monitoring your cholesterol and blood pressure.  This information is not intended to replace advice given to you by your health care provider. Make sure you discuss any questions you have with your health care provider.  Document Revised: 08/04/2020 Document Reviewed: 08/04/2020  Elsevier Patient Education  2024 [employer].

## 2023-12-15 NOTE — Assessment & Plan Note (Signed)
 Encourage high protein, high calorie diet

## 2023-12-15 NOTE — Progress Notes (Signed)
 Subjective:    Patient ID: Ian Gross, male    DOB: April 16, 1975, 48 y.o.   MRN: [id number]  HPI  Patient presents to clinic today for his annual exam.  Flu: never Tetanus: 04/2019 COVID: never Pneumovax: 04/2019 PSA screening: 11/2022 Colon screening: Never Vision screening: annually Dentist: annually  Diet: He does eat meat. He consumes fruits and veggies. He does eat some fried foods. He drinks mostly water , juice. Exercise: None  Review of Systems     Past Medical History:  Diagnosis Date   Alcoholic pancreatitis    s/p pancreatectomy   Diabetes mellitus with complication (HCC)    H/O splenectomy     Current Outpatient Medications  Medication Sig Dispense Refill   aspirin  EC 81 MG tablet Take 1 tablet (81 mg total) by mouth daily. Swallow whole.     atorvastatin  (LIPITOR ) 80 MG tablet TAKE 1 TABLET BY MOUTH EVERY DAY 90 tablet 1   Buprenorphine  HCl-Naloxone  HCl 8-2 MG FILM Place 1 Film under the tongue 2 (two) times daily.     GVOKE HYPOPEN  2-PACK 1 MG/0.2ML SOAJ Inject 1 mg into the skin as needed (hypoglycemia). 1 mL 0   HUMALOG KWIKPEN 100 UNIT/ML KwikPen Inject 4-9 Units into the skin 3 (three) times daily. Sliding scale (go up by 1 unit)     insulin  glargine (LANTUS ) 100 UNIT/ML injection Inject 10 Units into the skin at bedtime.     metoprolol  succinate (TOPROL -XL) 25 MG 24 hr tablet TAKE 1/2 TABLET BY MOUTH EVERY DAY 45 tablet 1   Pancrelipase , Lip-Prot-Amyl, (CREON ) 24000-76000 units CPEP Take 3 capsules (72,000 Units total) by mouth 3 (three) times daily. 810 capsule 1   pantoprazole  (PROTONIX ) 40 MG tablet TAKE 1 TABLET BY MOUTH EVERY DAY 90 tablet 1   warfarin (COUMADIN ) 2.5 MG tablet TAKE 1 TO 2 TABLETS BY MOUTH DAILY OR AS DIRECTED BY COUMADIN  CLINIC 110 tablet 0   No current facility-administered medications for this visit.    No Known Allergies  Family History  Problem Relation Age of Onset   Lung cancer Father    Healthy Sister    Healthy  Brother    CVA Maternal Grandmother    Liver cancer Maternal Grandfather    Alcohol abuse Maternal Grandfather     Social History   Socioeconomic History   Marital status: Single    Spouse name: Not on file   Number of children: 2   Years of education: Not on file   Highest education level: Not on file  Occupational History   Occupation: Disable  Tobacco Use   Smoking status: Some Days    Current packs/day: 0.75    Types: Cigarettes   Smokeless tobacco: Never  Vaping Use   Vaping status: Never Used  Substance and Sexual Activity   Alcohol use: Not Currently   Drug use: Not Currently   Sexual activity: Not on file  Other Topics Concern   Not on file  Social History Narrative   Not on file   Social Drivers of Health   Financial Resource Strain: Low Risk  (09/03/2021)   Overall Financial Resource Strain (CARDIA)    Difficulty of Paying Living Expenses: Not hard at all  Food Insecurity: No Food Insecurity (07/07/2022)   Hunger Vital Sign    Worried About Running Out of Food in the Last Year: Never true    Ran Out of Food in the Last Year: Never true  Transportation Needs: No Transportation Needs (07/07/2022)  PRAPARE - Administrator, Civil Service (Medical): No    Lack of Transportation (Non-Medical): No  Physical Activity: Not on file  Stress: No Stress Concern Present (09/03/2021)   [employer] of Occupational Health - Occupational Stress Questionnaire    Feeling of Stress : Only a little  Social Connections: Moderately Isolated (09/03/2021)   Social Connection and Isolation Panel    Frequency of Communication with Friends and Family: Twice a week    Frequency of Social Gatherings with Friends and Family: More than three times a week    Attends Religious Services: Never    Database administrator or Organizations: No    Attends Engineer, structural: Not on file    Marital Status: Living with partner  Intimate Partner Violence: Not At Risk  (09/03/2021)   Humiliation, Afraid, Rape, and Kick questionnaire    Fear of Current or Ex-Partner: No    Emotionally Abused: No    Physically Abused: No    Sexually Abused: No     Constitutional: Denies fever, malaise, fatigue, headache or abrupt weight changes.  HEENT: Denies eye pain, eye redness, ear pain, ringing in the ears, wax buildup, runny nose, nasal congestion, bloody nose, or sore throat. Respiratory: Denies difficulty breathing, shortness of breath, cough or sputum production.   Cardiovascular: Denies chest pain, chest tightness, palpitations or swelling in the hands or feet.  Gastrointestinal: Pt reports chronic abdominal pain. Denies bloating, constipation, diarrhea or blood in the stool.  GU: Denies urgency, frequency, pain with urination, burning sensation, blood in urine, odor or discharge. Musculoskeletal:  Denies decrease in range of motion, difficulty with gait, muscle pain or joint pain or swelling.  Skin: Denies redness, rashes, lesions or ulcercations.  Neurological: Denies dizziness, difficulty with memory, difficulty with speech or problems with balance and coordination.  Psych: Denies anxiety, depression, SI/HI.  No other specific complaints in a complete review of systems (except as listed in HPI above).  Objective:   Physical Exam  BP 112/64 (BP Location: Left Arm, Patient Position: Sitting, Cuff Size: Normal)   Ht 6' (1.829 m)   Wt 136 lb (61.7 kg)   BMI 18.44 kg/m    Wt Readings from Last 3 Encounters:  11/22/23 137 lb 6 oz (62.3 kg)  06/14/23 142 lb (64.4 kg)  05/20/23 144 lb 3.2 oz (65.4 kg)    General: Appears his stated age, underweight, in NAD. Skin: Warm, dry and intact. No ulcerations noted. HEENT: Head: normal shape and size; Eyes: sclera white, no icterus, conjunctiva pink, PERRLA and EOMs intact;  Neck:  Neck supple, trachea midline. No masses, lumps or thyromegaly present.  Cardiovascular: Normal rate and rhythm. S1,S2 noted.  No  murmur, rubs or gallops noted. No JVD or BLE edema.  Pulmonary/Chest: Normal effort and positive vesicular breath sounds. No respiratory distress. No wheezes, rales or ronchi noted.  Abdomen: Soft and nontender. Normal bowel sounds.  Musculoskeletal: Strength 5/5 BUE/BLE.  No difficulty with gait.  Neurological: Alert and oriented. Cranial nerves II-XII grossly intact. Coordination normal.  Psychiatric: Mood and affect normal. Behavior is normal. Judgment and thought content normal.     BMET    Component Value Date/Time   NA 139 03/14/2023 1518   K 5.0 03/14/2023 1518   CL 102 03/14/2023 1518   CO2 23 03/14/2023 1518   GLUCOSE 264 (H) 03/14/2023 1518   GLUCOSE 220 (H) 12/14/2022 0934   BUN 13 03/14/2023 1518   CREATININE 1.10 03/14/2023 1518  CREATININE 1.18 12/14/2022 0934   CALCIUM  8.7 03/14/2023 1518   GFRNONAA >60 07/01/2022 0045   GFRAA >60 08/01/2019 0509    Lipid Panel     Component Value Date/Time   CHOL 51 12/14/2022 0934   TRIG 39 12/14/2022 0934   HDL 30 (L) 12/14/2022 0934   CHOLHDL 1.7 12/14/2022 0934   VLDL 12 06/15/2022 1553   LDLCALC 10 12/14/2022 0934    CBC    Component Value Date/Time   WBC 10.2 03/14/2023 1518   WBC 11.2 (H) 12/14/2022 0934   RBC 4.16 03/14/2023 1518   RBC 4.22 12/14/2022 0934   HGB 13.6 03/14/2023 1518   HCT 40.5 03/14/2023 1518   PLT 256 03/14/2023 1518   MCV 97 03/14/2023 1518   MCH 32.7 03/14/2023 1518   MCH 31.3 12/14/2022 0934   MCHC 33.6 03/14/2023 1518   MCHC 33.2 12/14/2022 0934   RDW 12.5 03/14/2023 1518   LYMPHSABS 3.4 06/15/2022 1553   MONOABS 1.3 (H) 06/15/2022 1553   EOSABS 0.3 06/15/2022 1553   BASOSABS 0.1 06/15/2022 1553    Hgb A1C Lab Results  Component Value Date   HGBA1C 8.5 04/20/2023            Assessment & Plan:   Preventative health maintenance:  He declines flu shot Tetanus UTD Encouraged him to get his COVID-vaccine Pneumovax UTD Prevnar 20 declined He reports he just sent  his cologuard back 1 week ago, will wait for results Encouraged him to consume a balanced diet and exercise regimen Advised him to see an eye doctor and dentist annually We will check CBC, c-Met, lipid, urine microalbumin and PSA  RTC in 6 months, follow-up chronic conditions Angeline Laura, NP

## 2023-12-16 ENCOUNTER — Ambulatory Visit: Payer: Self-pay | Admitting: Internal Medicine

## 2023-12-17 LAB — LIPID PANEL
Cholesterol: 41 mg/dL (ref <200)
HDL: 24 mg/dL — ABNORMAL LOW (ref >=40)
LDL Cholesterol (Calc): <10 mg/dL
Non-HDL Cholesterol (Calc): 17 mg/dL (ref <130)
Total CHOL/HDL Ratio: 1.7 (calc) (ref <5.0)
Triglycerides: 40 mg/dL (ref <150)

## 2023-12-17 LAB — COMPREHENSIVE METABOLIC PANEL WITH GFR
AG Ratio: 1.6 (calc) (ref 1.0–2.5)
ALT: 16 U/L (ref 9–46)
AST: 21 U/L (ref 10–35)
Albumin: 3.2 g/dL — ABNORMAL LOW (ref 3.6–5.1)
Alkaline phosphatase (APISO): 153 U/L — ABNORMAL HIGH (ref 35–144)
BUN: 12 mg/dL (ref 7–25)
CO2: 30 mmol/L (ref 20–32)
Calcium: 8.2 mg/dL — ABNORMAL LOW (ref 8.6–10.3)
Chloride: 105 mmol/L (ref 98–110)
Creat: 1.18 mg/dL (ref 0.70–1.30)
Globulin: 2 g/dL (ref 1.9–3.7)
Glucose, Bld: 209 mg/dL — ABNORMAL HIGH (ref 65–99)
Potassium: 4.5 mmol/L (ref 3.5–5.3)
Sodium: 141 mmol/L (ref 135–146)
Total Bilirubin: 0.6 mg/dL (ref 0.2–1.2)
Total Protein: 5.2 g/dL — ABNORMAL LOW (ref 6.1–8.1)
eGFR: 75 mL/min/1.73m2 (ref >=60)

## 2023-12-17 LAB — CBC
HCT: 37.8 % — ABNORMAL LOW (ref 38.5–50.0)
Hemoglobin: 12.6 g/dL — ABNORMAL LOW (ref 13.2–17.1)
MCH: 32.8 pg (ref 27.0–33.0)
MCHC: 33.3 g/dL (ref 32.0–36.0)
MCV: 98.4 fL (ref 80.0–100.0)
MPV: 13.1 fL — ABNORMAL HIGH (ref 7.5–12.5)
Platelets: 228 Thousand/uL (ref 140–400)
RBC: 3.84 Million/uL — ABNORMAL LOW (ref 4.20–5.80)
RDW: 12.9 % (ref 11.0–15.0)
WBC: 8.1 Thousand/uL (ref 3.8–10.8)

## 2023-12-17 LAB — MICROALBUMIN / CREATININE URINE RATIO
Creatinine, Urine: 390 mg/dL — ABNORMAL HIGH (ref 20–320)
Microalb, Ur: <0.2 mg/dL

## 2023-12-17 LAB — PSA: PSA: 0.42 ng/mL (ref <=4.00)

## 2023-12-28 ENCOUNTER — Ambulatory Visit: Attending: Internal Medicine

## 2023-12-28 DIAGNOSIS — Z952 Presence of prosthetic heart valve: Secondary | ICD-10-CM

## 2023-12-28 DIAGNOSIS — Z5181 Encounter for therapeutic drug level monitoring: Secondary | ICD-10-CM

## 2023-12-28 LAB — POCT INR: INR: 2.5 (ref 2.0–3.0)

## 2023-12-28 NOTE — Patient Instructions (Signed)
 Continue 1 tablet daily, except 1.5 tablets every Monday, Wednesday and Friday Recheck in 6  weeks.  Call with new medications or bleeding problems [facility] [phone] or [place] office [phone].

## 2024-01-23 ENCOUNTER — Other Ambulatory Visit: Payer: Self-pay | Admitting: Internal Medicine

## 2024-01-23 DIAGNOSIS — Z7901 Long term (current) use of anticoagulants: Secondary | ICD-10-CM

## 2024-01-23 DIAGNOSIS — Z5181 Encounter for therapeutic drug level monitoring: Secondary | ICD-10-CM

## 2024-01-23 DIAGNOSIS — Z952 Presence of prosthetic heart valve: Secondary | ICD-10-CM

## 2024-01-23 NOTE — Telephone Encounter (Signed)
 Refill request for warfarin:  Last INR was 2.5 on 12/28/23 Next INR due 02/08/24 LOV was 11/22/23  Refill approved.

## 2024-01-30 ENCOUNTER — Other Ambulatory Visit: Payer: Self-pay | Admitting: Cardiology

## 2024-01-31 LAB — OPHTHALMOLOGY REPORT-SCANNED

## 2024-02-01 MED ORDER — ATORVASTATIN CALCIUM 80 MG PO TABS: 80.0000 mg | ORAL_TABLET | Freq: Every day | ORAL | 3 refills | Status: AC

## 2024-02-08 ENCOUNTER — Ambulatory Visit: Attending: Internal Medicine

## 2024-02-08 DIAGNOSIS — Z5181 Encounter for therapeutic drug level monitoring: Secondary | ICD-10-CM | POA: Diagnosis not present

## 2024-02-08 DIAGNOSIS — Z952 Presence of prosthetic heart valve: Secondary | ICD-10-CM | POA: Diagnosis not present

## 2024-02-08 LAB — POCT INR: INR: 3.1 — AB (ref 2.0–3.0)

## 2024-02-08 NOTE — Patient Instructions (Signed)
 Continue 1 tablet daily, except 1.5 tablets every Monday, Wednesday and Friday Recheck in 6  weeks.  Call with new medications or bleeding problems [facility] [phone] or [place] office [phone].

## 2024-02-17 ENCOUNTER — Encounter: Payer: Self-pay | Admitting: Pharmacist

## 2024-02-17 NOTE — Progress Notes (Signed)
   02/17/2024  Patient ID: Ian Gross, male   DOB: [birth year], 48 y.o.   MRN: [id number]  This patient is appearing on a report for being at risk of failing the adherence measure for cholesterol (statin) medications this calendar year.   Medication: atorvastatin  80 mg Last fill date: 02/01/2024 for 90 day supply  Insurance report was not up to date. No action needed at this time.   Sharyle Sia, PharmD, [facility] [phone]

## 2024-03-21 ENCOUNTER — Ambulatory Visit: Attending: Internal Medicine

## 2024-03-21 DIAGNOSIS — Z5181 Encounter for therapeutic drug level monitoring: Secondary | ICD-10-CM

## 2024-03-21 DIAGNOSIS — Z952 Presence of prosthetic heart valve: Secondary | ICD-10-CM

## 2024-03-21 LAB — POCT INR: INR: 2.5 (ref 2.0–3.0)

## 2024-03-21 NOTE — Patient Instructions (Signed)
 Continue 1 tablet daily, except 1.5 tablets every Monday, Wednesday and Friday Recheck in 6  weeks.  Call with new medications or bleeding problems [facility] [phone] or [place] office [phone].

## 2024-03-31 ENCOUNTER — Other Ambulatory Visit: Payer: Self-pay | Admitting: Physician Assistant

## 2024-03-31 ENCOUNTER — Other Ambulatory Visit: Payer: Self-pay | Admitting: Internal Medicine

## 2024-04-02 NOTE — Telephone Encounter (Signed)
 Requested medication (s) are due for refill today: yes  Requested medication (s) are on the active medication list: yes  Last refill:  06/14/23  Future visit scheduled: yes  Notes to clinic:   Medication not assigned to a protocol, review manually.      Requested Prescriptions  Pending Prescriptions Disp Refills   GVOKE HYPOPEN  2-PACK 1 MG/0.2ML SOAJ [Pharmacy Med Name: GVOKE HYPOPEN  2-PK 1 MG/0.2 ML]      Sig: Inject 1 mg into the skin as needed (hypoglycemia).     Off-Protocol Failed - 04/02/2024  4:20 PM      Failed - Medication not assigned to a protocol, review manually.      Passed - Valid encounter within last 12 months    Recent Outpatient Visits           3 months ago Encounter for general adult medical examination with abnormal findings   [facility] [place], [place] W, NP   9 months ago Type 2 diabetes mellitus with hyperglycemia, with long-term current use of insulin  [facility])   [facility] [place], Angeline ORN, [place]

## 2024-05-02 ENCOUNTER — Ambulatory Visit

## 2024-05-02 DIAGNOSIS — Z952 Presence of prosthetic heart valve: Secondary | ICD-10-CM

## 2024-05-02 DIAGNOSIS — Z5181 Encounter for therapeutic drug level monitoring: Secondary | ICD-10-CM

## 2024-05-02 LAB — POCT INR: INR: 2.1 (ref 2.0–3.0)

## 2024-05-02 NOTE — Patient Instructions (Signed)
 Continue 1 tablet daily, except 1.5 tablets every Monday, Wednesday and Friday Recheck in 8 weeks.  Call with new medications or bleeding problems Coumadin  Clinic [phone] or [place] office [phone].

## 2024-06-14 ENCOUNTER — Ambulatory Visit: Admitting: Internal Medicine

## 2024-06-27 ENCOUNTER — Ambulatory Visit
